# Patient Record
Sex: Male | Born: 1935
Health system: Southern US, Community
[De-identification: ages and names within clinical notes are randomized; demographics above are authoritative.]

## PROBLEM LIST (undated history)

## (undated) DIAGNOSIS — I5020 Unspecified systolic (congestive) heart failure: Secondary | ICD-10-CM

## (undated) DIAGNOSIS — I2510 Atherosclerotic heart disease of native coronary artery without angina pectoris: Secondary | ICD-10-CM

## (undated) DIAGNOSIS — I255 Ischemic cardiomyopathy: Secondary | ICD-10-CM

## (undated) DIAGNOSIS — J449 Chronic obstructive pulmonary disease, unspecified: Secondary | ICD-10-CM

## (undated) DIAGNOSIS — I10 Essential (primary) hypertension: Secondary | ICD-10-CM

## (undated) DIAGNOSIS — E785 Hyperlipidemia, unspecified: Secondary | ICD-10-CM

## (undated) HISTORY — DX: Unspecified systolic (congestive) heart failure: I50.20

## (undated) HISTORY — DX: Atherosclerotic heart disease of native coronary artery without angina pectoris: I25.10

## (undated) HISTORY — DX: Essential (primary) hypertension: I10

## (undated) HISTORY — DX: Hyperlipidemia, unspecified: E78.5

## (undated) HISTORY — DX: Ischemic cardiomyopathy: I25.5

## (undated) HISTORY — DX: Chronic obstructive pulmonary disease, unspecified: J44.9

## (undated) HISTORY — PX: BI-VENTRICULAR IMPLANTABLE CARDIOVERTER DEFIBRILLATOR  (CRT-D): SHX5747

---

## 1987-03-26 HISTORY — PX: CORONARY ARTERY BYPASS GRAFT: SHX141

## 1999-09-12 ENCOUNTER — Inpatient Hospital Stay (HOSPITAL_COMMUNITY): Admission: EM | Admit: 1999-09-12 | Discharge: 1999-09-14 | Payer: Self-pay | Admitting: Emergency Medicine

## 1999-09-14 ENCOUNTER — Encounter: Payer: Self-pay | Admitting: Cardiology

## 1999-11-15 ENCOUNTER — Encounter: Payer: Self-pay | Admitting: Internal Medicine

## 1999-11-15 ENCOUNTER — Observation Stay (HOSPITAL_COMMUNITY): Admission: RE | Admit: 1999-11-15 | Discharge: 1999-11-16 | Payer: Self-pay | Admitting: Internal Medicine

## 1999-11-16 ENCOUNTER — Encounter: Payer: Self-pay | Admitting: Internal Medicine

## 2001-05-07 ENCOUNTER — Ambulatory Visit (HOSPITAL_COMMUNITY): Admission: RE | Admit: 2001-05-07 | Discharge: 2001-05-07 | Payer: Self-pay | Admitting: Specialist

## 2001-05-20 ENCOUNTER — Ambulatory Visit (HOSPITAL_COMMUNITY): Admission: RE | Admit: 2001-05-20 | Discharge: 2001-05-20 | Payer: Self-pay | Admitting: Specialist

## 2001-05-20 ENCOUNTER — Encounter: Payer: Self-pay | Admitting: Specialist

## 2001-07-13 ENCOUNTER — Ambulatory Visit (HOSPITAL_COMMUNITY): Admission: RE | Admit: 2001-07-13 | Discharge: 2001-07-13 | Payer: Self-pay | Admitting: Specialist

## 2003-12-15 ENCOUNTER — Ambulatory Visit (HOSPITAL_COMMUNITY): Admission: RE | Admit: 2003-12-15 | Discharge: 2003-12-15 | Payer: Self-pay | Admitting: Internal Medicine

## 2004-04-04 ENCOUNTER — Ambulatory Visit: Payer: Self-pay | Admitting: Internal Medicine

## 2004-08-01 ENCOUNTER — Ambulatory Visit: Payer: Self-pay | Admitting: Cardiology

## 2004-08-02 ENCOUNTER — Ambulatory Visit: Payer: Self-pay | Admitting: Cardiology

## 2004-08-06 ENCOUNTER — Ambulatory Visit: Payer: Self-pay | Admitting: Internal Medicine

## 2004-10-16 ENCOUNTER — Ambulatory Visit: Payer: Self-pay | Admitting: Cardiology

## 2004-12-10 ENCOUNTER — Ambulatory Visit: Payer: Self-pay | Admitting: Cardiology

## 2005-01-14 ENCOUNTER — Ambulatory Visit: Payer: Self-pay | Admitting: Internal Medicine

## 2005-04-17 ENCOUNTER — Ambulatory Visit: Payer: Self-pay | Admitting: Cardiology

## 2005-05-07 ENCOUNTER — Ambulatory Visit: Payer: Self-pay | Admitting: Internal Medicine

## 2005-12-23 ENCOUNTER — Ambulatory Visit: Payer: Self-pay | Admitting: Cardiology

## 2005-12-31 ENCOUNTER — Ambulatory Visit: Payer: Self-pay

## 2006-01-01 LAB — CONVERTED CEMR LAB
ALT: 19 units/L (ref 0–40)
AST: 21 units/L (ref 0–37)
Albumin: 3.7 g/dL (ref 3.5–5.2)
Alkaline Phosphatase: 43 units/L (ref 39–117)
BUN: 11 mg/dL (ref 6–23)
Bilirubin, Direct: 0.2 mg/dL (ref 0.0–0.3)
CO2: 29 meq/L (ref 19–32)
Calcium: 9.5 mg/dL (ref 8.4–10.5)
Chloride: 107 meq/L (ref 96–112)
Chol/HDL Ratio, serum: 3.6
Cholesterol: 132 mg/dL (ref 0–200)
Creatinine, Ser: 1.3 mg/dL (ref 0.4–1.5)
GFR calc non Af Amer: 58 mL/min
Glomerular Filtration Rate, Af Am: 70 mL/min/{1.73_m2}
Glucose, Bld: 98 mg/dL (ref 70–99)
HCT: 44.2 % (ref 39.0–52.0)
HDL: 36.9 mg/dL — ABNORMAL LOW (ref >39.0)
Hemoglobin: 14.9 g/dL (ref 13.0–17.0)
LDL Cholesterol: 81 mg/dL (ref 0–99)
MCHC: 33.8 g/dL (ref 30.0–36.0)
MCV: 90.7 fL (ref 78.0–100.0)
Platelets: 156 10*3/uL (ref 150–400)
Potassium: 4.2 meq/L (ref 3.5–5.1)
RBC: 4.87 M/uL (ref 4.22–5.81)
RDW: 13.1 % (ref 11.5–14.6)
Sodium: 141 meq/L (ref 135–145)
Total Bilirubin: 0.8 mg/dL (ref 0.3–1.2)
Total Protein: 6.5 g/dL (ref 6.0–8.3)
Triglyceride fasting, serum: 71 mg/dL (ref 0–149)
VLDL: 14 mg/dL (ref 0–40)
WBC: 8.3 10*3/uL (ref 4.5–10.5)

## 2006-08-27 ENCOUNTER — Ambulatory Visit: Payer: Self-pay | Admitting: Internal Medicine

## 2006-11-27 ENCOUNTER — Ambulatory Visit: Payer: Self-pay | Admitting: Internal Medicine

## 2006-12-26 ENCOUNTER — Ambulatory Visit: Payer: Self-pay | Admitting: Cardiology

## 2006-12-30 ENCOUNTER — Ambulatory Visit: Payer: Self-pay | Admitting: Cardiology

## 2006-12-30 LAB — CONVERTED CEMR LAB
AST: 17 units/L (ref 0–37)
Bilirubin, Direct: 0.3 mg/dL (ref 0.0–0.3)
CO2: 33 meq/L — ABNORMAL HIGH (ref 19–32)
Chloride: 105 meq/L (ref 96–112)
Cholesterol: 121 mg/dL (ref 0–200)
Creatinine, Ser: 1.2 mg/dL (ref 0.4–1.5)
Eosinophils Relative: 6.3 % — ABNORMAL HIGH (ref 0.0–5.0)
Glucose, Bld: 108 mg/dL — ABNORMAL HIGH (ref 70–99)
HCT: 42.8 % (ref 39.0–52.0)
Hemoglobin: 14.6 g/dL (ref 13.0–17.0)
LDL Cholesterol: 70 mg/dL (ref 0–99)
MCHC: 34.2 g/dL (ref 30.0–36.0)
MCV: 90.7 fL (ref 78.0–100.0)
Monocytes Absolute: 0.8 10*3/uL — ABNORMAL HIGH (ref 0.2–0.7)
Neutrophils Relative %: 67 % (ref 43.0–77.0)
Potassium: 4.3 meq/L (ref 3.5–5.1)
RBC: 4.72 M/uL (ref 4.22–5.81)
RDW: 12.4 % (ref 11.5–14.6)
Sodium: 141 meq/L (ref 135–145)
TSH: 1.51 microintl units/mL (ref 0.35–5.50)
Total Bilirubin: 1 mg/dL (ref 0.3–1.2)
Total CHOL/HDL Ratio: 3.3
Total Protein: 6.5 g/dL (ref 6.0–8.3)
WBC: 9.9 10*3/uL (ref 4.5–10.5)

## 2007-02-26 ENCOUNTER — Ambulatory Visit: Payer: Self-pay | Admitting: Internal Medicine

## 2007-05-28 ENCOUNTER — Ambulatory Visit: Payer: Self-pay | Admitting: Internal Medicine

## 2007-09-01 ENCOUNTER — Ambulatory Visit: Payer: Self-pay | Admitting: Internal Medicine

## 2007-11-23 ENCOUNTER — Ambulatory Visit: Payer: Self-pay | Admitting: Cardiology

## 2007-11-25 ENCOUNTER — Ambulatory Visit: Payer: Self-pay | Admitting: Cardiology

## 2007-11-25 LAB — CONVERTED CEMR LAB
ALT: 18 units/L (ref 0–53)
AST: 20 units/L (ref 0–37)
Albumin: 4 g/dL (ref 3.5–5.2)
BUN: 11 mg/dL (ref 6–23)
Basophils Relative: 0.8 % (ref 0.0–3.0)
Calcium: 9.5 mg/dL (ref 8.4–10.5)
Chloride: 110 meq/L (ref 96–112)
Cholesterol: 142 mg/dL (ref 0–200)
Creatinine, Ser: 1.2 mg/dL (ref 0.4–1.5)
Eosinophils Absolute: 0.2 10*3/uL (ref 0.0–0.7)
Eosinophils Relative: 3 % (ref 0.0–5.0)
GFR calc non Af Amer: 63 mL/min
HCT: 45.5 % (ref 39.0–52.0)
Hemoglobin: 15.7 g/dL (ref 13.0–17.0)
MCV: 90.9 fL (ref 78.0–100.0)
Neutro Abs: 4.9 10*3/uL (ref 1.4–7.7)
Neutrophils Relative %: 60.9 % (ref 43.0–77.0)
Pro B Natriuretic peptide (BNP): 195 pg/mL — ABNORMAL HIGH (ref 0.0–100.0)
RBC: 5.01 M/uL (ref 4.22–5.81)
VLDL: 21 mg/dL (ref 0–40)
WBC: 8 10*3/uL (ref 4.5–10.5)

## 2007-12-03 ENCOUNTER — Ambulatory Visit: Payer: Self-pay | Admitting: Internal Medicine

## 2008-01-06 ENCOUNTER — Ambulatory Visit: Payer: Self-pay | Admitting: Cardiology

## 2008-02-05 ENCOUNTER — Ambulatory Visit: Payer: Self-pay | Admitting: Internal Medicine

## 2008-02-10 ENCOUNTER — Ambulatory Visit: Payer: Self-pay

## 2008-02-10 ENCOUNTER — Ambulatory Visit: Payer: Self-pay | Admitting: Internal Medicine

## 2008-02-10 LAB — CONVERTED CEMR LAB
Eosinophils Absolute: 0.2 10*3/uL (ref 0.0–0.7)
GFR calc Af Amer: 84 mL/min
GFR calc non Af Amer: 69 mL/min
HCT: 45.2 % (ref 39.0–52.0)
INR: 0.9 (ref 0.8–1.0)
MCV: 88.5 fL (ref 78.0–100.0)
Monocytes Absolute: 0.6 10*3/uL (ref 0.1–1.0)
Platelets: 140 10*3/uL — ABNORMAL LOW (ref 150–400)
Potassium: 4.5 meq/L (ref 3.5–5.1)
RDW: 13 % (ref 11.5–14.6)
Sodium: 141 meq/L (ref 135–145)
aPTT: 27.8 s (ref 21.7–29.8)

## 2008-02-11 ENCOUNTER — Observation Stay (HOSPITAL_COMMUNITY): Admission: RE | Admit: 2008-02-11 | Discharge: 2008-02-12 | Payer: Self-pay | Admitting: Internal Medicine

## 2008-02-11 ENCOUNTER — Ambulatory Visit: Payer: Self-pay | Admitting: Internal Medicine

## 2008-03-02 ENCOUNTER — Ambulatory Visit: Payer: Self-pay | Admitting: Internal Medicine

## 2008-03-02 ENCOUNTER — Ambulatory Visit: Payer: Self-pay

## 2008-03-02 LAB — CONVERTED CEMR LAB
BUN: 8 mg/dL (ref 6–23)
CO2: 31 meq/L (ref 19–32)
Calcium: 9.5 mg/dL (ref 8.4–10.5)
Chloride: 104 meq/L (ref 96–112)
Creatinine, Ser: 1.1 mg/dL (ref 0.4–1.5)
GFR calc Af Amer: 84 mL/min
GFR calc non Af Amer: 69 mL/min
Glucose, Bld: 108 mg/dL — ABNORMAL HIGH (ref 70–99)
Potassium: 4.4 meq/L (ref 3.5–5.1)
Sodium: 140 meq/L (ref 135–145)

## 2008-03-07 ENCOUNTER — Ambulatory Visit: Payer: Self-pay | Admitting: Internal Medicine

## 2008-04-11 ENCOUNTER — Ambulatory Visit: Payer: Self-pay | Admitting: Cardiology

## 2008-04-13 ENCOUNTER — Ambulatory Visit: Payer: Self-pay | Admitting: Cardiology

## 2008-04-13 LAB — CONVERTED CEMR LAB
Albumin: 3.9 g/dL (ref 3.5–5.2)
Alkaline Phosphatase: 47 units/L (ref 39–117)
BUN: 11 mg/dL (ref 6–23)
Calcium: 9.5 mg/dL (ref 8.4–10.5)
Creatinine, Ser: 1.2 mg/dL (ref 0.4–1.5)
Eosinophils Absolute: 0.3 10*3/uL (ref 0.0–0.7)
Eosinophils Relative: 3.8 % (ref 0.0–5.0)
GFR calc Af Amer: 76 mL/min
GFR calc non Af Amer: 63 mL/min
HCT: 47.5 % (ref 39.0–52.0)
HDL: 42.5 mg/dL (ref >39.0)
MCV: 88 fL (ref 78.0–100.0)
Monocytes Absolute: 0.6 10*3/uL (ref 0.1–1.0)
Platelets: 129 10*3/uL — ABNORMAL LOW (ref 150–400)
Potassium: 4.6 meq/L (ref 3.5–5.1)
Pro B Natriuretic peptide (BNP): 222 pg/mL — ABNORMAL HIGH (ref 0.0–100.0)
TSH: 1.6 microintl units/mL (ref 0.35–5.50)
Triglycerides: 118 mg/dL (ref 0–149)
WBC: 8.5 10*3/uL (ref 4.5–10.5)

## 2008-05-20 ENCOUNTER — Encounter: Payer: Self-pay | Admitting: Internal Medicine

## 2008-05-24 ENCOUNTER — Ambulatory Visit: Payer: Self-pay | Admitting: Internal Medicine

## 2008-07-04 DIAGNOSIS — I10 Essential (primary) hypertension: Secondary | ICD-10-CM | POA: Insufficient documentation

## 2008-07-04 DIAGNOSIS — I5022 Chronic systolic (congestive) heart failure: Secondary | ICD-10-CM | POA: Insufficient documentation

## 2008-07-04 DIAGNOSIS — E785 Hyperlipidemia, unspecified: Secondary | ICD-10-CM | POA: Insufficient documentation

## 2008-07-04 DIAGNOSIS — I255 Ischemic cardiomyopathy: Secondary | ICD-10-CM | POA: Insufficient documentation

## 2008-07-04 DIAGNOSIS — I2589 Other forms of chronic ischemic heart disease: Secondary | ICD-10-CM

## 2008-07-04 DIAGNOSIS — J449 Chronic obstructive pulmonary disease, unspecified: Secondary | ICD-10-CM | POA: Insufficient documentation

## 2008-07-24 ENCOUNTER — Encounter: Payer: Self-pay | Admitting: Internal Medicine

## 2008-07-25 ENCOUNTER — Encounter: Payer: Self-pay | Admitting: Cardiology

## 2008-07-25 ENCOUNTER — Ambulatory Visit: Payer: Self-pay

## 2008-07-25 ENCOUNTER — Ambulatory Visit: Payer: Self-pay | Admitting: Internal Medicine

## 2008-08-02 ENCOUNTER — Encounter: Payer: Self-pay | Admitting: Internal Medicine

## 2008-08-09 ENCOUNTER — Ambulatory Visit: Payer: Self-pay | Admitting: Cardiology

## 2008-08-17 ENCOUNTER — Ambulatory Visit: Payer: Self-pay | Admitting: Cardiology

## 2008-08-18 LAB — CONVERTED CEMR LAB
Eosinophils Relative: 3.6 % (ref 0.0–5.0)
GFR calc non Af Amer: 62.51 mL/min (ref >60)
HCT: 46.6 % (ref 39.0–52.0)
Hemoglobin: 15.9 g/dL (ref 13.0–17.0)
Lymphocytes Relative: 24.4 % (ref 12.0–46.0)
Lymphs Abs: 1.9 10*3/uL (ref 0.7–4.0)
Monocytes Relative: 6.6 % (ref 3.0–12.0)
Neutro Abs: 5.1 10*3/uL (ref 1.4–7.7)
Potassium: 4.7 meq/L (ref 3.5–5.1)
Pro B Natriuretic peptide (BNP): 174 pg/mL — ABNORMAL HIGH (ref 0.0–100.0)
RBC: 5.28 M/uL (ref 4.22–5.81)
Sodium: 142 meq/L (ref 135–145)
WBC: 7.8 10*3/uL (ref 4.5–10.5)

## 2008-10-24 ENCOUNTER — Ambulatory Visit: Payer: Self-pay | Admitting: Internal Medicine

## 2008-10-24 ENCOUNTER — Telehealth: Payer: Self-pay | Admitting: Internal Medicine

## 2008-11-29 ENCOUNTER — Encounter: Payer: Self-pay | Admitting: Internal Medicine

## 2008-11-29 ENCOUNTER — Ambulatory Visit: Payer: Self-pay | Admitting: Cardiology

## 2009-03-28 ENCOUNTER — Ambulatory Visit: Payer: Self-pay | Admitting: Cardiology

## 2009-04-07 ENCOUNTER — Ambulatory Visit: Payer: Self-pay | Admitting: Internal Medicine

## 2009-04-26 ENCOUNTER — Ambulatory Visit (HOSPITAL_COMMUNITY): Admission: RE | Admit: 2009-04-26 | Discharge: 2009-04-26 | Payer: Self-pay | Admitting: Internal Medicine

## 2009-04-26 ENCOUNTER — Ambulatory Visit: Payer: Self-pay | Admitting: Internal Medicine

## 2009-05-09 ENCOUNTER — Encounter: Payer: Self-pay | Admitting: Internal Medicine

## 2009-05-09 ENCOUNTER — Ambulatory Visit: Payer: Self-pay

## 2009-05-09 ENCOUNTER — Ambulatory Visit (HOSPITAL_COMMUNITY): Admission: RE | Admit: 2009-05-09 | Discharge: 2009-05-09 | Payer: Self-pay | Admitting: Internal Medicine

## 2009-05-09 ENCOUNTER — Ambulatory Visit: Payer: Self-pay | Admitting: Cardiology

## 2009-06-19 ENCOUNTER — Encounter: Payer: Self-pay | Admitting: Internal Medicine

## 2009-06-20 ENCOUNTER — Ambulatory Visit: Payer: Self-pay | Admitting: Internal Medicine

## 2009-07-27 ENCOUNTER — Telehealth: Payer: Self-pay | Admitting: Cardiology

## 2009-09-17 ENCOUNTER — Encounter: Payer: Self-pay | Admitting: Internal Medicine

## 2009-09-18 ENCOUNTER — Ambulatory Visit: Payer: Self-pay | Admitting: Internal Medicine

## 2009-09-22 ENCOUNTER — Ambulatory Visit: Payer: Self-pay | Admitting: Cardiology

## 2009-09-22 LAB — CONVERTED CEMR LAB
BUN: 11 mg/dL (ref 6–23)
Basophils Relative: 0 % (ref 0–1)
CO2: 29 meq/L (ref 19–32)
Calcium: 9.4 mg/dL (ref 8.4–10.5)
Chloride: 100 meq/L (ref 96–112)
Creatinine, Ser: 1.32 mg/dL (ref 0.40–1.50)
Eosinophils Absolute: 0.3 10*3/uL (ref 0.0–0.7)
Eosinophils Relative: 3 % (ref 0–5)
Glucose, Bld: 118 mg/dL — ABNORMAL HIGH (ref 70–99)
HCT: 43.2 % (ref 39.0–52.0)
Lymphs Abs: 2.4 10*3/uL (ref 0.7–4.0)
MCHC: 34.5 g/dL (ref 30.0–36.0)
MCV: 92.7 fL (ref 78.0–100.0)
Monocytes Relative: 7 % (ref 3–12)
Neutrophils Relative %: 67 % (ref 43–77)
RBC: 4.67 M/uL (ref 4.22–5.81)

## 2009-09-26 ENCOUNTER — Encounter (INDEPENDENT_AMBULATORY_CARE_PROVIDER_SITE_OTHER): Payer: Self-pay | Admitting: *Deleted

## 2009-09-26 ENCOUNTER — Encounter: Payer: Self-pay | Admitting: Cardiology

## 2009-10-02 ENCOUNTER — Encounter: Payer: Self-pay | Admitting: Cardiology

## 2009-10-03 ENCOUNTER — Ambulatory Visit: Payer: Self-pay | Admitting: Cardiology

## 2009-10-03 DIAGNOSIS — R0602 Shortness of breath: Secondary | ICD-10-CM | POA: Insufficient documentation

## 2009-10-03 LAB — CONVERTED CEMR LAB
BUN: 17 mg/dL (ref 6–23)
Basophils Absolute: 0 10*3/uL (ref 0.0–0.1)
Calcium: 9.6 mg/dL (ref 8.4–10.5)
Creatinine, Ser: 1.4 mg/dL (ref 0.4–1.5)
Eosinophils Relative: 2.6 % (ref 0.0–5.0)
GFR calc non Af Amer: 53.95 mL/min (ref >60)
Glucose, Bld: 99 mg/dL (ref 70–99)
HCT: 43.8 % (ref 39.0–52.0)
Lymphocytes Relative: 27.7 % (ref 12.0–46.0)
Lymphs Abs: 2.7 10*3/uL (ref 0.7–4.0)
Monocytes Relative: 6.1 % (ref 3.0–12.0)
Neutrophils Relative %: 63.3 % (ref 43.0–77.0)
Platelets: 161 10*3/uL (ref 150.0–400.0)
Potassium: 4.8 meq/L (ref 3.5–5.1)
Prothrombin Time: 10.3 s (ref 9.7–11.8)
RDW: 13.1 % (ref 11.5–14.6)
WBC: 9.7 10*3/uL (ref 4.5–10.5)
aPTT: 25.7 s (ref 21.7–28.8)

## 2009-10-06 ENCOUNTER — Encounter: Payer: Self-pay | Admitting: Internal Medicine

## 2009-10-06 ENCOUNTER — Ambulatory Visit: Payer: Self-pay | Admitting: Cardiology

## 2009-10-06 ENCOUNTER — Inpatient Hospital Stay (HOSPITAL_BASED_OUTPATIENT_CLINIC_OR_DEPARTMENT_OTHER): Admission: RE | Admit: 2009-10-06 | Discharge: 2009-10-06 | Payer: Self-pay | Admitting: Cardiology

## 2009-10-11 ENCOUNTER — Ambulatory Visit: Payer: Self-pay | Admitting: Cardiology

## 2009-10-17 ENCOUNTER — Telehealth: Payer: Self-pay | Admitting: Cardiology

## 2009-10-17 LAB — CONVERTED CEMR LAB
BUN: 18 mg/dL (ref 6–23)
CO2: 29 meq/L (ref 19–32)
Calcium: 9.4 mg/dL (ref 8.4–10.5)
Chloride: 103 meq/L (ref 96–112)
Creatinine, Ser: 1.4 mg/dL (ref 0.4–1.5)
GFR calc non Af Amer: 53.94 mL/min (ref >60)
Glucose, Bld: 98 mg/dL (ref 70–99)
Potassium: 4.8 meq/L (ref 3.5–5.1)
Sodium: 138 meq/L (ref 135–145)

## 2009-11-08 ENCOUNTER — Encounter: Payer: Self-pay | Admitting: Cardiology

## 2009-11-09 ENCOUNTER — Ambulatory Visit: Payer: Self-pay | Admitting: Cardiology

## 2010-01-09 ENCOUNTER — Ambulatory Visit: Payer: Self-pay | Admitting: Cardiology

## 2010-01-09 ENCOUNTER — Encounter: Payer: Self-pay | Admitting: Cardiology

## 2010-01-09 ENCOUNTER — Encounter: Payer: Self-pay | Admitting: Internal Medicine

## 2010-01-12 ENCOUNTER — Telehealth: Payer: Self-pay | Admitting: Cardiology

## 2010-01-15 LAB — CONVERTED CEMR LAB
Calcium: 9.8 mg/dL (ref 8.4–10.5)
Chloride: 98 meq/L (ref 96–112)
Creatinine, Ser: 1.5 mg/dL (ref 0.4–1.5)
Sodium: 135 meq/L (ref 135–145)

## 2010-03-06 ENCOUNTER — Telehealth: Payer: Self-pay | Admitting: Cardiology

## 2010-04-12 ENCOUNTER — Encounter: Payer: Self-pay | Admitting: Internal Medicine

## 2010-04-12 ENCOUNTER — Ambulatory Visit
Admission: RE | Admit: 2010-04-12 | Discharge: 2010-04-12 | Payer: Self-pay | Source: Home / Self Care | Attending: Cardiovascular Disease | Admitting: Cardiovascular Disease

## 2010-04-12 ENCOUNTER — Ambulatory Visit
Admission: RE | Admit: 2010-04-12 | Discharge: 2010-04-12 | Payer: Self-pay | Source: Home / Self Care | Attending: Internal Medicine | Admitting: Internal Medicine

## 2010-04-22 LAB — CONVERTED CEMR LAB
BUN: 13 mg/dL (ref 6–23)
CO2: 32 meq/L (ref 19–32)
Chloride: 104 meq/L (ref 96–112)
Creatinine, Ser: 1.3 mg/dL (ref 0.4–1.5)
Potassium: 4.6 meq/L (ref 3.5–5.1)

## 2010-04-24 NOTE — Progress Notes (Signed)
Summary: refill medsdone x2  Phone Note Refill Request Call back at Home Phone [phone] Message from:  Patient on Jul 27, 2009 2:07 PM  Refills Requested: Medication #1:  ISOSORBIDE DINITRATE CR 40 MG CR-TABS Take two capsules by mouth at bedtime [facility] [address]    Method Requested: Fax to Local Pharmacy Initial call taken by: Lorne Skeens,  Jul 27, 2009 2:08 PM    Prescriptions: ISOSORBIDE DINITRATE CR 40 MG CR-TABS (ISOSORBIDE DINITRATE) Take two capsules by mouth at bedtime  #60 x 6   Entered by:   Burnett Kanaris, CNA   Authorized by:   Lenoria Farrier, MD, [facility]   Signed by:   Burnett Kanaris, CNA on 07/27/2009   Method used:   Electronically to        [facility]  [phone]* (retail)       [address]       [place], [place]  [address]       Ph: [phone] or [phone]       Fax: [phone]   RxID:   [id number]   Appended Document: Isosorbide corrected to Monotrate this is the wrong prescription, per pt he has been on Isosorbide Monotrate 30mg  two times a day for a while, this is also what the pharmacy shows, new rx w/12 refills given to pharmacy via phone   Clinical Lists Changes  Medications: Changed medication from ISOSORBIDE DINITRATE CR 40 MG CR-TABS (ISOSORBIDE DINITRATE) Take two capsules by mouth at bedtime to ISOSORBIDE MONONITRATE CR 30 MG XR24H-TAB (ISOSORBIDE MONONITRATE) Take one tablet by mouth two times a day - Signed Rx of ISOSORBIDE MONONITRATE CR 30 MG XR24H-TAB (ISOSORBIDE MONONITRATE) Take one tablet by mouth two times a day;  #60 x 12;  Signed;  Entered by: Meredith Staggers, RN;  Authorized by: Lenoria Farrier, MD, [facility];  Method used: Telephoned to [facility]  [phone]*, [address], [place], [place], [place]  [address], Ph: [phone] or [phone], Fax: [phone]    Prescriptions: ISOSORBIDE MONONITRATE CR 30 MG XR24H-TAB (ISOSORBIDE MONONITRATE) Take one  tablet by mouth two times a day  #60 x 12   Entered by:   Meredith Staggers, RN   Authorized by:   Lenoria Farrier, MD, [facility]   Signed by:   Meredith Staggers, RN on 07/27/2009   Method used:   Telephoned to ...       [facility]  [address]  [phone]* (retail)       [address]       [place], [place]  [address]       Ph: [phone] or [phone]       Fax: [phone]   RxID:   [id number]

## 2010-04-24 NOTE — Cardiovascular Report (Signed)
Summary: Office Visit Remote   Office Visit Remote   Imported By: Roderic Ovens 10/09/2009 12:31:50  _____________________________________________________________________  External Attachment:    Type:   Image     Comment:   External Document

## 2010-04-24 NOTE — Letter (Signed)
Summary: ELectrophysiology/Ablation Procedure Instructions  [facility], Main Office  [address]   [place], [place] [address]   Phone: [phone]  Fax: [phone]     Electrophysiology/Ablation Procedure Instructions    You are scheduled for a(n) DFT CHECK on Apr 26, 2009 at 1:00 PM with Dr. Graciela Husbands.  1.  Please come to the Short Stay Center at [facility] at 11:00 on the day of your procedure.  2.  Come prepared to stay overnight.   Please bring your insurance cards and a list of your medications.  3.  Lab work will be done in the hopital that morning: BMET  4.  Do not have anything to eat or drink after midnight the night before your procedure.  5.  All of your remaining medications may be taken with a small amount of water.      * If you have any questions after you get home, please call the office at [phone].

## 2010-04-24 NOTE — Miscellaneous (Signed)
  Clinical Lists Changes  Observations: Added new observation of ECHOINTERP:  - Left ventricle: There is akinesis of the inferior and posterior       walls. There is aneurysmal dilitation of the base of the inferior       wall. The EF is in the 30% range.     - Aortic valve: Sclerosis without stenosis.     - Mitral valve: Mild regurgitation.     - Right ventricle: Pacer wire or catheter noted in right ventricle. (05/09/2009 14:09)      Echocardiogram  Procedure date:  05/09/2009  Findings:       - Left ventricle: There is akinesis of the inferior and posterior       walls. There is aneurysmal dilitation of the base of the inferior       wall. The EF is in the 30% range.     - Aortic valve: Sclerosis without stenosis.     - Mitral valve: Mild regurgitation.     - Right ventricle: Pacer wire or catheter noted in right ventricle.

## 2010-04-24 NOTE — Assessment & Plan Note (Signed)
Summary: f22m   Visit Type:  Follow-up Primary Provider:  Dr Annitta Needs at [facility]  CC:  no complaints.  History of Present Illness: [name] [name] is 77 years old return for management of CHF and CAD.he had bypass surgery in 1989 and has known total occlusion of the vein graft to the diagonal branch of the LAD. He has an ischemic cardiomyopathy with ejection fraction of 30%. This improved to 35-40% following placement of a biventricular pacemaker but then fell to 30% on his last echocardiogram in February of 2011.  He says he's not been doing well he gives out very easily. He dates this to November 2009 when he had an upgrade of his ICD to a biventricular pacemaker. His initial ICD was implanted as part of the beta-2 trial in 2001. In 2005 units in exchange for recall. In November 2009 he had an upgrade to a biventricular pacemaker and had a new ventricular sensing lead placed in the right ventricle. There was some concern about sensing of the right ventricle he underwent EP testing in February of this year and everything was okay with the sensing.  He's not had chest pain.  His other problems include hypertension, hyperlipidemia, and COPD.  Current Medications (verified): 1)  Aspirin 81 Mg Tbec (Aspirin) .... Take One Tablet By Mouth Daily 2)  Isosorbide Mononitrate Cr 30 Mg Xr24h-Tab (Isosorbide Mononitrate) .... Take One Tablet By Mouth Two Times A Day 3)  Simvastatin 40 Mg Tabs (Simvastatin) .... Take One Tablet By Mouth Daily At Bedtime 4)  Ramipril 10 Mg Caps (Ramipril) .... Take One Capsule By Mouth 2 Times Per Day 5)  Aldactone 25 Mg Tabs (Spironolactone) .... Take 1/2 Tablet Daily 6)  Bisoprolol-Hydrochlorothiazide 5-6.25 Mg Tabs (Bisoprolol-Hydrochlorothiazide) .... Once Daily  Allergies (verified): No Known Drug Allergies  Past History:  Past Medical History: Reviewed history from 04/06/2009 and no changes required.  COPD (ICD-496) 1. Coronary artery disease status post coronary  bypass graft surgery     in 1989 with documented occlusion of the vein graft to the diagonal     branch of the left anterior descending in 2001. 2. Ischemic cardiomyopathy, ejection fraction of 30% improved 35-40% 5/09 3. Class III systolic congestive heart, improved to class II,     euvolemic. 4. Status post dual-chamber implantable cardioverter-defibrillator     with recent upgrade to a biventricular pacer/implantable     cardioverter-defibrillator.-St. Jude Promote [id number] 5. Hyperlipidemia. 6. Hypertension. 7. Chronic obstructive pulmonary disease.     Review of Systems       ROS is negative except as outlined in HPI.   Vital Signs:  Patient profile:   77 year old male Height:      67 inches Weight:      155 pounds BMI:     24.36 Pulse rate:   63 / minute BP sitting:   124 / 68  (left arm) Cuff size:   large  Vitals Entered By: Burnett Kanaris, CNA (September 22, 2009 2:03 PM)  Physical Exam  Additional Exam:  Gen. Well-nourished, in no distress   Neck: No JVD, thyroid not enlarged, bilateral carotid bruits Lungs: No tachypnea, clear without rales, rhonchi or wheezes Cardiovascular: Rhythm regular, PMI not displaced,  heart sounds  normal, no murmurs or gallops, no peripheral edema, pulses normal in all 4 extremities. Abdomen: BS normal, abdomen soft and non-tender without masses or organomegaly, no hepatosplenomegaly. MS: No deformities, no cyanosis or clubbing   Neuro:  No focal sns   Skin:  no lesions     ICD Specifications Following MD:  Sherryl Manges, MD     Referring MD:  Evana Runnels ICD Vendor:  St Jude     ICD Model Number:  BM8413-24     ICD Serial Number:  [id number] ICD DOI:  02/11/2008     ICD Implanting MD:  Sherryl Manges, MD  Lead 1:    Location: RA     DOI: 11/15/1999     Model #: 6940     Serial #: [id number]     Status: active Lead 2:    Location: RV     DOI: 11/15/1999     Model #: 0148     Serial #: [id number]     Status: active Lead 3:    Location: RV     DOI:  02/11/2008     Model #: 1688TC     Serial #: [id number]     Status: active Lead 4:    Location: LV     DOI: 02/11/2008     Model #: 1156T     Serial #: [id number]     Status: active  Indications::  VT; CHF  Explantation Comments: RV rate sense lead  ICD Follow Up ICD Dependent:  No       ICD Device Measurements Configuration: LV ring to RV coil  Episodes Coumadin:  No  Brady Parameters Mode DDD     Lower Rate Limit:  60     Upper Rate Limit 110 PAV 150     Sensed AV Delay:  130  Tachy Zones VF:  214     VT:  187     VT1:  160     Impression & Recommendations:  Problem # 1:  SYSTOLIC HEART FAILURE, CHRONIC (ICD-428.22)  He has ischemic cardiomyopathy with an ejection fraction of 30%. He has persistent symptoms of fatigue with exertion and is somewhat unhappy about his symptoms. He dates them to when he had the upgrade in his biventricular pacemaker although his ejection fraction initially improved and according to clinical notes he seemed to have some clinical improvement. His last ejection fraction showed some decline down to 30%.  He is now 20 years post bypass; the best way to try and find out if there is something we can do to help with the symptoms to be a cardiac catheterization. He will consider this after discussing it with his children. His wife is with him today. Dr. Graciela Husbands had recently evaluated his pacemaker/defibrillator and had optimize the AV delay and all the function appears good. His updated medication list for this problem includes:    Aspirin 81 Mg Tbec (Aspirin) .Marland Kitchen... Take one tablet by mouth daily    Isosorbide Mononitrate Cr 30 Mg Xr24h-tab (Isosorbide mononitrate) .Marland Kitchen... Take one tablet by mouth two times a day    Ramipril 10 Mg Caps (Ramipril) .Marland Kitchen... Take one capsule by mouth 2 times per day    Aldactone 25 Mg Tabs (Spironolactone) .Marland Kitchen... Take 1/2 tablet daily    Bisoprolol-hydrochlorothiazide 5-6.25 Mg Tabs (Bisoprolol-hydrochlorothiazide) ..... Once  daily  Orders: T-Basic Metabolic Panel [phone]) T-CBC w/Diff [phone]) T-BNP  (B Natriuretic Peptide) [phone])  His updated medication list for this problem includes:    Aspirin 81 Mg Tbec (Aspirin) .Marland Kitchen... Take one tablet by mouth daily    Isosorbide Mononitrate Cr 30 Mg Xr24h-tab (Isosorbide mononitrate) .Marland Kitchen... Take one tablet by mouth two times a day    Ramipril 10 Mg Caps (Ramipril) .Marland Kitchen... Take one capsule by mouth  2 times per day    Aldactone 25 Mg Tabs (Spironolactone) .Marland Kitchen... Take 1/2 tablet daily    Bisoprolol-hydrochlorothiazide 5-6.25 Mg Tabs (Bisoprolol-hydrochlorothiazide) ..... Once daily  Problem # 2:  CAD, ARTERY BYPASS GRAFT (ICD-414.04)  He had remote bypass surgery in 1989 and has one occluded graft to the diagonal branch. He's had no chest pain but his symptoms of fatigue could be an anginal equivalent and his LV function is gotten worse. We will consider further evaluation as outlined above. His updated medication list for this problem includes:    Aspirin 81 Mg Tbec (Aspirin) .Marland Kitchen... Take one tablet by mouth daily    Isosorbide Mononitrate Cr 30 Mg Xr24h-tab (Isosorbide mononitrate) .Marland Kitchen... Take one tablet by mouth two times a day    Ramipril 10 Mg Caps (Ramipril) .Marland Kitchen... Take one capsule by mouth 2 times per day    Bisoprolol-hydrochlorothiazide 5-6.25 Mg Tabs (Bisoprolol-hydrochlorothiazide) ..... Once daily  Orders: T-Basic Metabolic Panel [phone]) T-CBC w/Diff [phone]) T-BNP  (B Natriuretic Peptide) [phone])  His updated medication list for this problem includes:    Aspirin 81 Mg Tbec (Aspirin) .Marland Kitchen... Take one tablet by mouth daily    Isosorbide Mononitrate Cr 30 Mg Xr24h-tab (Isosorbide mononitrate) .Marland Kitchen... Take one tablet by mouth two times a day    Ramipril 10 Mg Caps (Ramipril) .Marland Kitchen... Take one capsule by mouth 2 times per day    Bisoprolol-hydrochlorothiazide 5-6.25 Mg Tabs (Bisoprolol-hydrochlorothiazide) ..... Once daily  Problem # 3:  ICD  - IN SITU-ST JUDE PROMOTE (ICD-V45.02) He has an ICD by the pacemaker with 3 separate procedures as described in the history of present illness. His pacemaker function appears good and has recently been evaluated by Dr. Alberteen Spindle so I think it unlikely that this is contributing to the problem.  Patient Instructions: 1)  Your physician recommends that you have lab work today: bmet/cbc/bnp (427.22;414.01) 2)  Your physician wants you to follow-up in: 6 months with Dr. Clifton James.  You will receive a reminder letter in the mail two months in advance. If you don't receive a letter, please call our office to schedule the follow-up appointment. 3)  If you decide to proceed with a heart catheterization, you may contact Dr. Regino Schultze nurse, Herbert Seta, on tues 7/5 or wed 7/6 to let her know. She will schedule this for you. 4)  Your physician recommends that you continue on your current medications as directed. Please refer to the Current Medication list given to you today.

## 2010-04-24 NOTE — Miscellaneous (Signed)
Summary: Orders Update  Clinical Lists Changes  Problems: Added new problem of SHORTNESS OF BREATH (ICD-786.05) Orders: Added new Test order of T-2 View CXR (71020TC) - Signed 

## 2010-04-24 NOTE — Assessment & Plan Note (Signed)
Summary: f20m   Visit Type:  Follow-up Primary Provider:  Dr Annitta Needs at [place]  CC:  sob.  History of Present Illness: Mr Gladman 77 years old and returns for management of CAD and CHF. He had remote bypass surgery. He has an ischemic cardio myopathy with an ejection fraction of 30% but improved to 35-40% following biventricular pacing. He had an ICD placed in 2009 had an upgrade to a biventricular pacemaker.  Because of symptoms of fatigue he underwent catheterization in July of 2011. He had total occlusion of the vein graft to the right coronary artery and total occlusion of the vein graft to the diagonal branch. Ejection fraction was 40% and his wedge pressure was 22. We increased his diuretic slightly. His creatinines have been in the range of 1.4.  He's done well since his last visit. He's had no chest pain or palpitations. He feels like his exercise count has remained stable.  He is thinking about getting married.  Current Medications (verified): 1)  Aspirin 81 Mg Tbec (Aspirin) .... Take One Tablet By Mouth Daily 2)  Isosorbide Mononitrate Cr 30 Mg Xr24h-Tab (Isosorbide Mononitrate) .... Take One Tablet By Mouth Two Times A Day 3)  Simvastatin 40 Mg Tabs (Simvastatin) .... Take One Tablet By Mouth Daily At Bedtime 4)  Ramipril 10 Mg Caps (Ramipril) .... Take One Capsule By Mouth 2 Times Per Day 5)  Aldactone 25 Mg Tabs (Spironolactone) .... Take 1/2 Tablet Daily 6)  Bisoprolol-Hydrochlorothiazide 5-6.25 Mg Tabs (Bisoprolol-Hydrochlorothiazide) .... Once Daily 7)  Furosemide 40 Mg Tabs (Furosemide) .... Take One Tablet By Mouth Daily. 8)  Multivitamins   Tabs (Multiple Vitamin) .... Centrum Silver Daily  Allergies (verified): No Known Drug Allergies  Past History:  Past Medical History: Reviewed history from 04/06/2009 and no changes required.  COPD (ICD-496) 1. Coronary artery disease status post coronary bypass graft surgery     in 1989 with documented occlusion of the vein  graft to the diagonal     branch of the left anterior descending in 2001. 2. Ischemic cardiomyopathy, ejection fraction of 30% improved 35-40% 5/09 3. Class III systolic congestive heart, improved to class II,     euvolemic. 4. Status post dual-chamber implantable cardioverter-defibrillator     with recent upgrade to a biventricular pacer/implantable     cardioverter-defibrillator.-St. Jude Promote [id number] 5. Hyperlipidemia. 6. Hypertension. 7. Chronic obstructive pulmonary disease.     Review of Systems       ROS is negative except as outlined in HPI.   Vital Signs:  Patient profile:   77 year old male Height:      67 inches Weight:      153 pounds BMI:     24.05 Pulse rate:   63 / minute BP sitting:   116 / 62  (left arm) Cuff size:   large  Vitals Entered By: Burnett Kanaris, CNA (January 09, 2010 9:39 AM)  Physical Exam  Additional Exam:  Gen. Well-nourished, in no distress   Neck: No JVD, thyroid not enlarged, no carotid bruits Lungs: No tachypnea, clear without rales, rhonchi or wheezes Cardiovascular: Rhythm regular, PMI not displaced,  heart sounds  normal, no murmurs or gallops, no peripheral edema, pulses normal in all 4 extremities. Abdomen: BS normal, abdomen soft and non-tender without masses or organomegaly, no hepatosplenomegaly. MS: No deformities, no cyanosis or clubbing   Neuro:  No focal sns   Skin:  no lesions     ICD Specifications Following MD:  Sherryl Manges, MD  Referring MD:  BRODIE ICD Vendor:  St Jude     ICD Model Number:  [id number]     ICD Serial Number:  [id number] ICD DOI:  02/11/2008     ICD Implanting MD:  Sherryl Manges, MD  Lead 1:    Location: RA     DOI: 11/15/1999     Model #: 6940     Serial #: [id number]     Status: active Lead 2:    Location: RV     DOI: 11/15/1999     Model #: 0148     Serial #: [id number]     Status: active Lead 3:    Location: RV     DOI: 02/11/2008     Model #: 1688TC     Serial #: [id number]     Status: active Lead  4:    Location: LV     DOI: 02/11/2008     Model #: 1156T     Serial #: [id number]     Status: active  Indications::  VT; CHF  Explantation Comments: RV rate sense lead  ICD Follow Up ICD Dependent:  No       ICD Device Measurements Configuration: LV ring to RV coil  Episodes Coumadin:  No  Brady Parameters Mode DDD     Lower Rate Limit:  60     Upper Rate Limit 110 PAV 150     Sensed AV Delay:  130  Tachy Zones VF:  214     VT:  187     VT1:  160     Impression & Recommendations:  Problem # 1:  CAD, ARTERY BYPASS GRAFT (ICD-414.04) He is status post remote bypass surgery and had 2 occluded grafts at catheterization in July of 2011. This is managed medically. He has had no chest pain. This appears stable. His updated medication list for this problem includes:    Aspirin 81 Mg Tbec (Aspirin) .Marland Kitchen... Take one tablet by mouth daily    Isosorbide Mononitrate Cr 30 Mg Xr24h-tab (Isosorbide mononitrate) .Marland Kitchen... Take one tablet by mouth two times a day    Ramipril 10 Mg Caps (Ramipril) .Marland Kitchen... Take one capsule by mouth 2 times per day    Bisoprolol-hydrochlorothiazide 5-6.25 Mg Tabs (Bisoprolol-hydrochlorothiazide) ..... Once daily  Orders: TLB-BMP (Basic Metabolic Panel-BMET) (80048-METABOL)  Problem # 2:  SYSTOLIC HEART FAILURE, CHRONIC (ICD-428.22) He has an ischemic cardiomyopathy with an ejection fraction of 30% which improved to 35-40% following biventricular pacing. He appears euvolemic today and I believe this problem is stable. His updated medication list for this problem includes:    Aspirin 81 Mg Tbec (Aspirin) .Marland Kitchen... Take one tablet by mouth daily    Isosorbide Mononitrate Cr 30 Mg Xr24h-tab (Isosorbide mononitrate) .Marland Kitchen... Take one tablet by mouth two times a day    Ramipril 10 Mg Caps (Ramipril) .Marland Kitchen... Take one capsule by mouth 2 times per day    Aldactone 25 Mg Tabs (Spironolactone) .Marland Kitchen... Take 1/2 tablet daily    Bisoprolol-hydrochlorothiazide 5-6.25 Mg Tabs  (Bisoprolol-hydrochlorothiazide) ..... Once daily    Furosemide 40 Mg Tabs (Furosemide) .Marland Kitchen... Take one tablet by mouth daily.  Problem # 3:  ICD - IN SITU-ST JUDE PROMOTE (ICD-V45.02) He has an ICD and biventricular pacemaker. He is pacing both chambers on his ECG today. He was not due for interrogation today.  Problem # 4:  HYPERTENSION, UNSPECIFIED (ICD-401.9) This is well-controlled on current medications. His updated medication list for this problem includes:    Aspirin 81  Mg Tbec (Aspirin) .Marland Kitchen... Take one tablet by mouth daily    Ramipril 10 Mg Caps (Ramipril) .Marland Kitchen... Take one capsule by mouth 2 times per day    Aldactone 25 Mg Tabs (Spironolactone) .Marland Kitchen... Take 1/2 tablet daily    Bisoprolol-hydrochlorothiazide 5-6.25 Mg Tabs (Bisoprolol-hydrochlorothiazide) ..... Once daily    Furosemide 40 Mg Tabs (Furosemide) .Marland Kitchen... Take one tablet by mouth daily.  Orders: TLB-BMP (Basic Metabolic Panel-BMET) (80048-METABOL)  Patient Instructions: 1)  Labwork today: bmet (414.01;428.0). 2)  Your physician recommends that you schedule a follow-up appointment in: 3 months with Dr. Clifton James. 3)  Your physician recommends that you continue on your current medications as directed. Please refer to the Current Medication list given to you today.

## 2010-04-24 NOTE — Procedures (Signed)
Summary: [facility]   Current Medications (verified): 1)  Aspirin 81 Mg Tbec (Aspirin) .... Take One Tablet By Mouth Daily 2)  Isosorbide Mononitrate Cr 30 Mg Xr24h-Tab (Isosorbide Mononitrate) .... Take One Tablet By Mouth Two Times A Day 3)  Simvastatin 40 Mg Tabs (Simvastatin) .... Take One Tablet By Mouth Daily At Bedtime 4)  Ramipril 10 Mg Caps (Ramipril) .... Take One Capsule By Mouth 2 Times Per Day 5)  Aldactone 25 Mg Tabs (Spironolactone) .... Take 1/2 Tablet Daily 6)  Bisoprolol-Hydrochlorothiazide 5-6.25 Mg Tabs (Bisoprolol-Hydrochlorothiazide) .... Once Daily 7)  Furosemide 40 Mg Tabs (Furosemide) .... Take One Tablet By Mouth Daily. 8)  Multivitamins   Tabs (Multiple Vitamin) .... Centrum Silver Daily  Allergies (verified): No Known Drug Allergies   ICD Specifications Following MD:  Sherryl Manges, MD     Referring MD:  Amberlee Garvey ICD Vendor:  St Jude     ICD Model Number:  [id number]     ICD Serial Number:  [id number] ICD DOI:  02/11/2008     ICD Implanting MD:  Sherryl Manges, MD  Lead 1:    Location: RA     DOI: 11/15/1999     Model #: 6940     Serial #: [id number]     Status: active Lead 2:    Location: RV     DOI: 11/15/1999     Model #: 0148     Serial #: [id number]     Status: active Lead 3:    Location: RV     DOI: 02/11/2008     Model #: 1688TC     Serial #: [id number]     Status: active Lead 4:    Location: LV     DOI: 02/11/2008     Model #: 1156T     Serial #: [id number]     Status: active  Indications::  VT; CHF  Explantation Comments: RV rate sense lead  ICD Follow Up Battery Voltage:  3.13 V     Charge Time:  10.8 seconds     Battery Est. Longevity:  4.6 yrs Underlying rhythm:  SR ICD Dependent:  No       ICD Device Measurements [facility]rium:  Amplitude: 3.6 mV, Impedance: 480 ohms, Threshold: 0.75 V at 0.5 msec Right Ventricle:  Amplitude: 4.7 mV, Impedance: 400 ohms, Threshold: 1.25 V at 0.5 msec Left Ventricle:  Impedance: 530 ohms, Threshold: 0.5 V at 0.5  msec Configuration: LV ring to RV coil Shock Impedance: 52 ohms   Episodes MS Episodes:  0     Coumadin:  No Shock:  0     ATP:  0     Nonsustained:  0     Atrial Therapies:  0 Atrial Pacing:  29%     Ventricular Pacing:  88%  Brady Parameters Mode DDD     Lower Rate Limit:  60     Upper Rate Limit 110 PAV 150     Sensed AV Delay:  100  Tachy Zones VF:  214     VT:  187     VT1:  160     Tech Comments:  NORMAL DEVICE FUNCTION.  NO EPISODES SINCE LAST CHECK.  NO CHANGES MADE. MERLIN TRANSMISSION 04-12-10.

## 2010-04-24 NOTE — Assessment & Plan Note (Signed)
Summary: f33m   Primary Provider:  Dr Annitta Needs at [facility]  CC:  follow up 4 months.  Pt spouse reports that he is SOB and tired all the time.  Marland Kitchen  History of Present Illness:    [name] [name] is seen in followup for Ischemic cardiomyopathy congestive heart failure for which he is s/p CRT-D implantation.  He is s/p CABG and most recent EF was about 30-35% in fall of 2009.  He continues to complain of SOB primarily DOE and fatigue.   He has low R waves ranging from 3.7-4.4 most recently; there have been no intercurrent therapies.   The patient denies  chest pain, edema or palpitations   Current Medications (verified): 1)  Coreg 12.5 Mg Tabs (Carvedilol) .... Take 1 Tablet By Mouth Twice A Day 2)  Aspirin 81 Mg Tbec (Aspirin) .... Take One Tablet By Mouth Daily 3)  Isosorbide Dinitrate Cr 40 Mg Cr-Tabs (Isosorbide Dinitrate) .... Take Two Capsules By Mouth At Bedtime 4)  Simvastatin 40 Mg Tabs (Simvastatin) .... Take One Tablet By Mouth Daily At Bedtime 5)  Ramipril 10 Mg Caps (Ramipril) .... Take One Capsule By Mouth 2 Times Per Day  Allergies (verified): No Known Drug Allergies  Past History:  Past Medical History: Last updated: 04/06/2009  COPD (ICD-496) 1. Coronary artery disease status post coronary bypass graft surgery     in 1989 with documented occlusion of the vein graft to the diagonal     branch of the left anterior descending in 2001. 2. Ischemic cardiomyopathy, ejection fraction of 30% improved 35-40% 5/09 3. Class III systolic congestive heart, improved to class II,     euvolemic. 4. Status post dual-chamber implantable cardioverter-defibrillator     with recent upgrade to a biventricular pacer/implantable     cardioverter-defibrillator.-St. Jude Promote [id number] 5. Hyperlipidemia. 6. Hypertension. 7. Chronic obstructive pulmonary disease.     Past Surgical History: Last updated: 04/06/2009 Explantation of a previously implanted device, pocket revision and   implantation of a new defibrillator with intraoperative defibrillation threshold testing. SURGEON:  Duke Salvia, M.D.  12/15/03 Contrast venography and implantation of a dual-chamber defibrillator by  Nathen May, M.D. 11/15/99  St. Jude Promote [id number]  Social History: Last updated: 07/04/2008 Tobacco Use - No.   Vital Signs:  Patient profile:   77 year old male Height:      67 inches Weight:      155 pounds BMI:     24.36 Pulse rate:   68 / minute Pulse rhythm:   regular BP sitting:   178 / 90  (left arm) Cuff size:   regular  Vitals Entered By: Judithe Modest CMA (April 07, 2009 9:06 AM)  Physical Exam  General:  The patient was alert and oriented in no acute distress. HEENT Normal.  Neck veins were flat, carotids were brisk.  Lungs were clear.  Heart sounds were regular without murmurs or gallops.  Abdomen was soft with active bowel sounds. There is no clubbing cyanosis or edema. Skin Warm and dry     ICD Specifications Following MD:  Sherryl Manges, MD     Referring MD:  Juanda Chance ICD Vendor:  St Jude     ICD Model Number:  [id number]     ICD Serial Number:  [id number] ICD DOI:  02/11/2008     ICD Implanting MD:  Sherryl Manges, MD  Lead 1:    Location: RA     DOI: 11/15/1999     Model #: [id number]  Serial #: [id number]     Status: active Lead 2:    Location: RV     DOI: 11/15/1999     Model #: 0148     Serial #: [id number]     Status: active Lead 3:    Location: RV     DOI: 02/11/2008     Model #: [id number]     Serial #: [id number]     Status: active Lead 4:    Location: LV     DOI: 02/11/2008     Model #: 1156T     Serial #: [id number]     Status: active  Indications::  VR; CHF  Explantation Comments: RV rate sense lead  ICD Follow Up Remote Check?  No Battery Voltage:  3.19 V     Charge Time:  10.6 seconds     Battery Est. Longevity:  5.3 YEARS Underlying rhythm:  SR ICD Dependent:  No       ICD Device Measurements Atrium:  Amplitude: 3.6 mV, Impedance: 400 ohms,  Threshold: 0.75 V at 0.5 msec Right Ventricle:  Amplitude: 3.4 mV, Impedance: 340 ohms, Threshold: 1.0 V at 0.5 msec Left Ventricle:  Impedance: 530 ohms, Threshold: 1.0 V at 0.5 msec Configuration: LV ring to RV coil Shock Impedance: 51 ohms   Episodes MS Episodes:  1     Percent Mode Switch:  <1%     Coumadin:  No Shock:  0     ATP:  0     Nonsustained:  0     Atrial Pacing:  6.9%     Ventricular Pacing:  83%  Brady Parameters Mode DDD     Lower Rate Limit:  60     Upper Rate Limit 110 PAV 150     Sensed AV Delay:  100  Tachy Zones VF:  214     VT:  187     VT1:  160     Next Remote Date:  07/03/2009     Next Cardiology Appt Due:  03/26/2010 Tech Comments:  Normal device function. Mode switch episode was less than 1 minute.  R waves still low, but consistent.  RV lead impedence stable.  Question was whether or not we should repeat DFT's.  Pt does Merlin transmissions.  ROV 12 months SK unless other plans for DFT are made. Gypsy Balsam RN BSN  April 07, 2009 9:24 AM   Impression & Recommendations:  Problem # 1:  ICD - IN SITU-ST JUDE PROMOTE (ICD-V45.02) Device parameters and data were reviewed and no changes were made  given the diminished R waves, we will plan to undertake DFT testing to assure adequate sensing of induced VF.    Problem # 2:  CARDIOMYOPATHY, ISCHEMIC (ICD-414.8) H e is without chest pain but given the fatigue we will try a different beta blocker and he is to let u s know in two weeks time whether he is better.  We will try bisoprolol at 5 mg/12.5 The following medications were removed from the medication list:    Coreg 12.5 Mg Tabs (Carvedilol) .Marland Kitchen... Take 1 tablet by mouth twice a day His updated medication list for this problem includes:    Aspirin 81 Mg Tbec (Aspirin) .Marland Kitchen... Take one tablet by mouth daily    Isosorbide Dinitrate Cr 40 Mg Cr-tabs (Isosorbide dinitrate) .Marland Kitchen... Take two capsules by mouth at bedtime    Ramipril 10 Mg Caps (Ramipril) .Marland Kitchen... Take one  capsule by mouth 2 times per day  Bisoprolol Fumarate 5 Mg Tabs (Bisoprolol fumarate) ..... Once daily  Problem # 3:  HYPERTENSION, UNSPECIFIED (ICD-401.9) poorly controlled.  we will add HCTZ to his bisoprlol and also aldactone for his cardiomyopathy  We have reviewed the side effects incl gynocomastia and hyperkalemia  We will plan to check his potassium in two weeks time when we do DFT The following medications were removed from the medication list:    Coreg 12.5 Mg Tabs (Carvedilol) .Marland Kitchen... Take 1 tablet by mouth twice a day His updated medication list for this problem includes:    Aspirin 81 Mg Tbec (Aspirin) .Marland Kitchen... Take one tablet by mouth daily    Ramipril 10 Mg Caps (Ramipril) .Marland Kitchen... Take one capsule by mouth 2 times per day    Bisoprolol Fumarate 5 Mg Tabs (Bisoprolol fumarate) ..... Once daily    Aldactone 25 Mg Tabs (Spironolactone) .Marland Kitchen... Take 1/2 tablet daily  Problem # 4:  SYSTOLIC HEART FAILURE, CHRONIC (ICD-428.22) see the above The following medications were removed from the medication list:    Coreg 12.5 Mg Tabs (Carvedilol) .Marland Kitchen... Take 1 tablet by mouth twice a day    Bisoprolol Fumarate 5 Mg Tabs (Bisoprolol fumarate) ..... Once daily His updated medication list for this problem includes:    Aspirin 81 Mg Tbec (Aspirin) .Marland Kitchen... Take one tablet by mouth daily    Isosorbide Dinitrate Cr 40 Mg Cr-tabs (Isosorbide dinitrate) .Marland Kitchen... Take two capsules by mouth at bedtime    Ramipril 10 Mg Caps (Ramipril) .Marland Kitchen... Take one capsule by mouth 2 times per day    Aldactone 25 Mg Tabs (Spironolactone) .Marland Kitchen... Take 1/2 tablet daily    Bisoprolol-hydrochlorothiazide 5-6.25 Mg Tabs (Bisoprolol-hydrochlorothiazide) ..... Once daily  Patient Instructions: 1)  Your physician has recommended you make the following change in your medication: STOP COREG. 2)  START BISOPROLOL-HCTZ 5-6.25 MG DAILY 3)  START ALDACTONE 12.5MG  DAILY 4)  Your physician recommends that you HAVE A DFT CHECK IN 2 WEEKS 5)   Your physician recommends that you schedule a follow-up appointment in: 12 MONTHS Prescriptions: BISOPROLOL-HYDROCHLOROTHIAZIDE 5-6.25 MG TABS (BISOPROLOL-HYDROCHLOROTHIAZIDE) once daily  #30 x 11   Entered by:   Duncan Dull, RN, BSN   Authorized by:   Nathen May, MD, [facility]   Signed by:   Duncan Dull, RN, BSN on 04/07/2009   Method used:   Electronically to        [facility]  [phone]* (retail)       [address]       [place], [place]  [address]       Ph: [phone] or [phone]       Fax: [phone]   RxID:   [id number] ALDACTONE 25 MG TABS (SPIRONOLACTONE) TAKE 1/2 TABLET DAILY  #15 x 11   Entered by:   Duncan Dull, RN, BSN   Authorized by:   Nathen May, MD, [facility]   Signed by:   Duncan Dull, RN, BSN on 04/07/2009   Method used:   Electronically to        [facility]  [phone]* (retail)       [address]       [place], [place]  [address]       Ph: [phone] or [phone]       Fax: [phone]   RxID:   [id number] BISOPROLOL FUMARATE 5 MG TABS (BISOPROLOL FUMARATE) once daily  #30 x  11   Entered by:   Duncan Dull, RN, BSN   Authorized by:   Nathen May, MD, [facility]   Signed by:   Duncan Dull, RN, BSN on 04/07/2009   Method used:   Electronically to        [facility]  [phone]* (retail)       [address]       [place], [place]  [address]       Ph: [phone] or [phone]       Fax: [phone]   RxID:   [id number]

## 2010-04-24 NOTE — Cardiovascular Report (Signed)
Summary: Pre Cath Orders   Pre Cath Orders   Imported By: Roderic Ovens 10/06/2009 11:11:41  _____________________________________________________________________  External Attachment:    Type:   Image     Comment:   External Document

## 2010-04-24 NOTE — Letter (Signed)
Summary: Remote Device Check  [facility], Main Office  [address]   [place], [place] [address]   Phone: [phone]  Fax: [phone]     October 06, 2009 MRN: [id number]   [facility] [address] [place], [place]  [address]   Dear Mr. FORSTROM,   Your remote transmission was recieved and reviewed by your physician.  All diagnostics were within normal limits for you.  __X___Your next transmission is scheduled for:  12-21-2009.  Please transmit at any time this day.  If you have a wireless device your transmission will be sent automatically.   Sincerely,  Vella Kohler

## 2010-04-24 NOTE — Assessment & Plan Note (Signed)
Summary: f82m   Visit Type:  Follow-up Primary Provider:  Dr Annitta Needs at [place]  CC:  no complaints.  History of Present Illness: The patient is 77 years old and return for management of CAD and CHF. He had bypass surgery in 1989 and documented occlusion of the vein graft to the diagonal branch of the LAD in 2001. He has an ischemic or adenopathy with an ejection fraction of 30% this improved to 35-40% following ICD and by the pacer implant. In November of 2009 he went underwent an upgrade of his ICD with placement of a new sensing lead due to sensing problems with the ventricular lead. Recent checks of his ICD indicated low R waves and there is concern that his R waves may not be adequate to detect and treat the fibrillation should it occur.  Otherwise he has been doing well. He's had no chest pain and no shortness of breath. He occasionally notices that his pulse gets when he checks his pulse but he cannot feel this.  Current Medications (verified): 1)  Coreg 12.5 Mg Tabs (Carvedilol) .... Take 1 Tablet By Mouth Twice A Day 2)  Aspirin 81 Mg Tbec (Aspirin) .... Take One Tablet By Mouth Daily 3)  Isosorbide Dinitrate Cr 40 Mg Cr-Tabs (Isosorbide Dinitrate) .... Take Two Capsules By Mouth At Bedtime 4)  Simvastatin 40 Mg Tabs (Simvastatin) .... Take One Tablet By Mouth Daily At Bedtime 5)  Ramipril 10 Mg Caps (Ramipril) .... Take One Capsule By Mouth 2 Times Per Day  Allergies (verified): No Known Drug Allergies  Past History:  Past Medical History: Reviewed history from 08/08/2008 and no changes required.  COPD (ICD-496) 1. Coronary artery disease status post coronary bypass graft surgery     in 1989 with documented occlusion of the vein graft to the diagonal     branch of the left anterior descending in 2001. 2. Ischemic cardiomyopathy, ejection fraction of 30% improved 35-40% 5/09 3. Class III systolic congestive heart, improved to class II,     euvolemic. 4. Status post  dual-chamber implantable cardioverter-defibrillator     with recent upgrade to a biventricular pacer/implantable     cardioverter-defibrillator. 5. Hyperlipidemia. 6. Hypertension. 7. Chronic obstructive pulmonary disease.     Review of Systems       ROS is negative except as outlined in HPI.   Vital Signs:  Patient profile:   77 year old male Height:      67 inches Weight:      154 pounds BMI:     24.21 Pulse rate:   64 / minute BP sitting:   150 / 77  (left arm) Cuff size:   regular  Vitals Entered By: Burnett Kanaris, CNA (March 28, 2009 9:18 AM)  Physical Exam  Additional Exam:  Gen. Well-nourished, in no distress   Neck: No JVD, thyroid not enlarged, no carotid bruits Lungs: No tachypnea, clear without rales, rhonchi or wheezes Cardiovascular: Rhythm regular, PMI not displaced,  heart sounds  normal, no murmurs or gallops, no peripheral edema, pulses normal in all 4 extremities. Abdomen: BS normal, abdomen soft and non-tender without masses or organomegaly, no hepatosplenomegaly. MS: No deformities, no cyanosis or clubbing   Neuro:  No focal sns   Skin:  no lesions     ICD Specifications Following MD:  Sherryl Manges, MD     Referring MD:  Juanda Chance ICD Vendor:  St Jude     ICD Model Number:  [id number]     ICD Serial  Number:  [id number] ICD DOI:  02/11/2008     ICD Implanting MD:  Sherryl Manges, MD  Lead 1:    Location: RA     DOI: 11/15/1999     Model #: 6940     Serial #: [id number]     Status: active Lead 2:    Location: RV     DOI: 11/15/1999     Model #: 0148     Serial #: [id number]     Status: active Lead 3:    Location: RV     DOI: 02/11/2008     Model #: [id number]     Serial #: [id number]     Status: active Lead 4:    Location: LV     DOI: 02/11/2008     Model #: 1156T     Serial #: [id number]     Status: active  Indications::  VR; CHF  Explantation Comments: RV rate sense lead  ICD Follow Up ICD Dependent:  No       ICD Device Measurements Configuration: LV ring to RV  coil  Episodes Coumadin:  No  Brady Parameters Mode DDD     Lower Rate Limit:  60     Upper Rate Limit 110 PAV 150     Sensed AV Delay:  100  Tachy Zones VF:  214     VT:  187     VT1:  160     Impression & Recommendations:  Problem # 1:  SYSTOLIC HEART FAILURE, CHRONIC (ICD-428.22) He appears euvolemic and compensated today. This problem is stable. We will continue current therapy. His updated medication list for this problem includes:    Coreg 12.5 Mg Tabs (Carvedilol) .Marland Kitchen... Take 1 tablet by mouth twice a day    Aspirin 81 Mg Tbec (Aspirin) .Marland Kitchen... Take one tablet by mouth daily    Isosorbide Dinitrate Cr 40 Mg Cr-tabs (Isosorbide dinitrate) .Marland Kitchen... Take two capsules by mouth at bedtime    Ramipril 10 Mg Caps (Ramipril) .Marland Kitchen... Take one capsule by mouth 2 times per day  Problem # 2:  CAD, ARTERY BYPASS GRAFT (ICD-414.04) He has had previous bypass surgery and one graft is known to be occluded. He has had no chest pain. This problem appears stable. His updated medication list for this problem includes:    Coreg 12.5 Mg Tabs (Carvedilol) .Marland Kitchen... Take 1 tablet by mouth twice a day    Aspirin 81 Mg Tbec (Aspirin) .Marland Kitchen... Take one tablet by mouth daily    Isosorbide Dinitrate Cr 40 Mg Cr-tabs (Isosorbide dinitrate) .Marland Kitchen... Take two capsules by mouth at bedtime    Ramipril 10 Mg Caps (Ramipril) .Marland Kitchen... Take one capsule by mouth 2 times per day  Problem # 3:  HYPERTENSION, UNSPECIFIED (ICD-401.9) His blood pressure is borderline elevated today but he says he checks it at [facility] and it's 130/75. We will not make any changes in his therapy today. His updated medication list for this problem includes:    Coreg 12.5 Mg Tabs (Carvedilol) .Marland Kitchen... Take 1 tablet by mouth twice a day    Aspirin 81 Mg Tbec (Aspirin) .Marland Kitchen... Take one tablet by mouth daily    Ramipril 10 Mg Caps (Ramipril) .Marland Kitchen... Take one capsule by mouth 2 times per day  Problem # 4:  ICD - IN SITU (ICD-V45.02) He has a potential problem with  the sensing lead on his ICD. The R waves are low. He is scheduled to see Dr. Graciela Husbands on January 14 and a decision  will be made whether he needs to come in for check of DFTs.  Patient Instructions: 1)  Your physician wants you to follow-up in: 6 months. You will receive a reminder letter in the mail two months in advance. If you don't receive a letter, please call our office to schedule the follow-up appointment. 2)  Keep your appointment with Dr. Graciela Husbands on 04/07/09.

## 2010-04-24 NOTE — Cardiovascular Report (Signed)
Summary: Office Visit   Office Visit   Imported By: Roderic Ovens 04/12/2009 14:36:53  _____________________________________________________________________  External Attachment:    Type:   Image     Comment:   External Document

## 2010-04-24 NOTE — Progress Notes (Signed)
Summary: returned call  Phone Note Call from Patient   Reason for Call: Talk to Nurse Summary of Call: returned call to heather-pls call [phone] Initial call taken by: Glynda Jaeger,  October 17, 2009 9:14 AM  Follow-up for Phone Call        I spoke with the pt. Follow-up by: Sherri Rad, RN, BSN,  October 17, 2009 10:14 AM

## 2010-04-24 NOTE — Cardiovascular Report (Signed)
Summary: Office Visit   Office Visit   Imported By: Roderic Ovens 11/10/2009 13:46:04  _____________________________________________________________________  External Attachment:    Type:   Image     Comment:   External Document

## 2010-04-24 NOTE — Miscellaneous (Signed)
Summary: Orders Update  Clinical Lists Changes  Orders: Added new Referral order of Cardiac Catheterization (Cardiac Cath) - Signed 

## 2010-04-24 NOTE — Letter (Signed)
Summary: Cardiac Catheterization Instructions- [facility]  [facility], Main Office  [address]   [place], [place] [address]   Phone: [phone]  Fax: [phone]     09/26/2009 MRN: [id number]  [facility] [place] [address] [place], [place]  [address]  Dear Mr. GOLSON,   You are scheduled for a Cardiac Catheterization on Friday 10/06/09 with Dr.Brodie  Please arrive to the 1st floor of the Heart and Vascular Center at [facility] at 10:30 am on the day of your procedure. Please do not arrive before 6:30 a.m. Call the Heart and Vascular Center at [phone] if you are unable to make your appointmnet. The Code to get into the parking garage under the building is 1000. Take the elevators to the 1st floor. You must have someone to drive you home. Someone must be with you for the first 24 hours after you arrive home. Please wear clothes that are easy to get on and off and wear slip-on shoes. Do not eat or drink after midnight except water with your medications that morning. Bring all your medications and current insurance cards with you.  _x__ DO NOT take these medications before your procedure: - hold aldactone the morning of your procedure.  _x__ Make sure you take your aspirin.  _x__ You may take ALL of your other medications with water that morning. ________________________________________________________________________________________________________________________________     The usual length of stay after your procedure is 2 to 3 hours. This can vary.  If you have any questions, please call the office at the number listed above.   Sherri Rad, RN, BSN

## 2010-04-24 NOTE — Progress Notes (Signed)
Summary: rtn call  Phone Note Call from Patient Call back at Home Phone [phone]   Caller: Patient Reason for Call: Talk to Nurse, Talk to Doctor Summary of Call: pt rtn call from [facility] he thinks it is regarding his lab work Initial call taken by: Omer Jack,  January 12, 2010 9:05 AM  Follow-up for Phone Call        Pt aware of lab results. Mylo Red RN

## 2010-04-24 NOTE — Cardiovascular Report (Signed)
Summary: Office Visit   Office Visit   Imported By: Roderic Ovens 11/10/2009 13:46:24  _____________________________________________________________________  External Attachment:    Type:   Image     Comment:   External Document

## 2010-04-24 NOTE — Assessment & Plan Note (Signed)
Summary: percert/saf   Primary Provider:  Dr Annitta Needs at [facility]  CC:  precert. Pt states that he is feeling good.  Marland Kitchen  History of Present Illness:    [name] [name] is seen in followup for Ischemic cardiomyopathy congestive heart failure for which he is s/p CRT-D implantation.  He is s/p CABG and most recent EF was about 30-35% in fall of 2009.  He continues to complain of SOB primarily DOE and fatigue.   He has low R waves ranging from 3.7-4.4 most recently; because of this he underwent repeat DFT testing to assess for adequate sensing. This was normal.  He says his breathing is much better since his AV optimization echo; he is hoping that there is greater benefit to be gained. He's not having chest pain or shortness of breath.   Current Medications (verified): 1)  Aspirin 81 Mg Tbec (Aspirin) .... Take One Tablet By Mouth Daily 2)  Isosorbide Dinitrate Cr 40 Mg Cr-Tabs (Isosorbide Dinitrate) .... Take Two Capsules By Mouth At Bedtime 3)  Simvastatin 40 Mg Tabs (Simvastatin) .... Take One Tablet By Mouth Daily At Bedtime 4)  Ramipril 10 Mg Caps (Ramipril) .... Take One Capsule By Mouth 2 Times Per Day 5)  Aldactone 25 Mg Tabs (Spironolactone) .... Take 1/2 Tablet Daily 6)  Bisoprolol-Hydrochlorothiazide 5-6.25 Mg Tabs (Bisoprolol-Hydrochlorothiazide) .... Once Daily  Allergies (verified): No Known Drug Allergies  Past History:  Past Medical History: Last updated: 04/06/2009  COPD (ICD-496) 1. Coronary artery disease status post coronary bypass graft surgery     in 1989 with documented occlusion of the vein graft to the diagonal     branch of the left anterior descending in 2001. 2. Ischemic cardiomyopathy, ejection fraction of 30% improved 35-40% 5/09 3. Class III systolic congestive heart, improved to class II,     euvolemic. 4. Status post dual-chamber implantable cardioverter-defibrillator     with recent upgrade to a biventricular pacer/implantable  cardioverter-defibrillator.-St. Jude Promote [id number] 5. Hyperlipidemia. 6. Hypertension. 7. Chronic obstructive pulmonary disease.     Past Surgical History: Last updated: 04/06/2009 Explantation of a previously implanted device, pocket revision and  implantation of a new defibrillator with intraoperative defibrillation threshold testing. SURGEON:  Duke Salvia, M.D.  12/15/03 Contrast venography and implantation of a dual-chamber defibrillator by  Nathen May, M.D. 11/15/99  St. Jude Promote [id number]  Social History: Last updated: 07/04/2008 Tobacco Use - No.   Vital Signs:  Patient profile:   77 year old male Height:      67 inches Weight:      157 pounds BMI:     24.68 Pulse rate:   65 / minute Pulse rhythm:   regular BP sitting:   149 / 68  (left arm) Cuff size:   large  Vitals Entered By: Judithe Modest CMA (June 20, 2009 9:57 AM)  Physical Exam  General:  Well developed, well nourished, in no acute distress. Head:  HEENT normal Neck:  neck veins7 cm; supple Lungs:  clear Heart:  regular rate and rhythm without murmurs. S4 is present Abdomen:  soft nontender without hepatomegaly Msk:  Back normal, normal gait. Muscle strength and tone normal. Extremities:  No clubbing or cyanosis.; is no edema Neurologic:  Alert and oriented x 3. Skin:  Intact without lesions or rashes.    ICD Specifications Following MD:  Sherryl Manges, MD     Referring MD:  Juanda Chance ICD Vendor:  [facility]     ICD Model Number:  [id number]  ICD Serial Number:  [id number] ICD DOI:  02/11/2008     ICD Implanting MD:  Sherryl Manges, MD  Lead 1:    Location: RA     DOI: 11/15/1999     Model #: 6940     Serial #: [id number]     Status: active Lead 2:    Location: RV     DOI: 11/15/1999     Model #: 0148     Serial #: [id number]     Status: active Lead 3:    Location: RV     DOI: 02/11/2008     Model #: [id number]     Serial #: [id number]     Status: active Lead 4:    Location: LV     DOI: 02/11/2008      Model #: 1156T     Serial #: [id number]     Status: active  Indications::  VT; CHF  Explantation Comments: RV rate sense lead  ICD Follow Up Remote Check?  No Battery Voltage:  3.19 V     Charge Time:  10.5 seconds     Battery Est. Longevity:  5.3 years Underlying rhythm:  SR/PVC's ICD Dependent:  No       ICD Device Measurements Atrium:  Amplitude: 2.0 mV, Impedance: 410 ohms, Threshold: 0.75 V at 0.5 msec Right Ventricle:  Amplitude: 3.8 mV, Impedance: 340 ohms, Threshold: 1.25 V at 0.5 msec Left Ventricle:  Impedance: 540 ohms, Threshold: 0.5 V at 0.5 msec Configuration: LV ring to RV coil  Episodes MS Episodes:  0     Percent Mode Switch:  0     Coumadin:  No Shock:  0     ATP:  0     Nonsustained:  0     Atrial Pacing:  29%     Ventricular Pacing:  97%  Brady Parameters Mode DDD     Lower Rate Limit:  60     Upper Rate Limit 110 PAV 150     Sensed AV Delay:  130  Tachy Zones VF:  214     VT:  187     VT1:  160     Next Remote Date:  09/18/2009     Next Cardiology Appt Due:  05/24/2010 Tech Comments:  The patient is c/o some SOB on exertion.  He also tells me his activity level has been decreased because of the winter weather.  Quick opt done, SAV reprogrammed 130 msec. NSR today with frequent PVC's.  Merlin transmissions every 3 months.  ROV 1 year with Dr. Graciela Husbands. [name] Harm, LPN  June 20, 2009 10:14 AM   Impression & Recommendations:  Problem # 1:  SYSTOLIC HEART FAILURE, CHRONIC (ICD-428.22) Congestive failure symptoms are improved following his AV optimization echo. Will continue him on his current medications. His updated medication list for this problem includes:    Aspirin 81 Mg Tbec (Aspirin) .Marland Kitchen... Take one tablet by mouth daily    Isosorbide Dinitrate Cr 40 Mg Cr-tabs (Isosorbide dinitrate) .Marland Kitchen... Take two capsules by mouth at bedtime    Ramipril 10 Mg Caps (Ramipril) .Marland Kitchen... Take one capsule by mouth 2 times per day    Aldactone 25 Mg Tabs (Spironolactone) .Marland Kitchen...  Take 1/2 tablet daily    Bisoprolol-hydrochlorothiazide 5-6.25 Mg Tabs (Bisoprolol-hydrochlorothiazide) ..... Once daily  Problem # 2:  CARDIOMYOPATHY, ISCHEMIC (ICD-414.8) stable without chest pain His updated medication list for this problem includes:    Aspirin 81 Mg Tbec (Aspirin) .Marland Kitchen... Take one tablet  by mouth daily    Isosorbide Dinitrate Cr 40 Mg Cr-tabs (Isosorbide dinitrate) .Marland Kitchen... Take two capsules by mouth at bedtime    Ramipril 10 Mg Caps (Ramipril) .Marland Kitchen... Take one capsule by mouth 2 times per day    Aldactone 25 Mg Tabs (Spironolactone) .Marland Kitchen... Take 1/2 tablet daily    Bisoprolol-hydrochlorothiazide 5-6.25 Mg Tabs (Bisoprolol-hydrochlorothiazide) ..... Once daily  Problem # 3:  ICD - IN SITU-ST JUDE PROMOTE (ICD-V45.02) Device parameters and data were reviewed and no changes were made  Patient Instructions: 1)  Your physician wants you to follow-up in: 12 months with Dr Graciela Husbands.  You will receive a reminder letter in the mail two months in advance. If you don't receive a letter, please call our office to schedule the follow-up appointment.

## 2010-04-24 NOTE — Cardiovascular Report (Signed)
Summary: Office Visit   Office Visit   Imported By: Roderic Ovens 06/22/2009 10:42:54  _____________________________________________________________________  External Attachment:    Type:   Image     Comment:   External Document

## 2010-04-24 NOTE — Cardiovascular Report (Signed)
Summary: Office Visit   Office Visit   Imported By: Roderic Ovens 01/11/2010 12:04:46  _____________________________________________________________________  External Attachment:    Type:   Image     Comment:   External Document

## 2010-04-24 NOTE — Assessment & Plan Note (Signed)
Summary: eph   Visit Type:  eph Primary Provider:  Dr Annitta Needs at [facility]  CC:  still SOB.  History of Present Illness: the patient is 77 years old and returned for followup visit after his recent catheterization. He had bypass surgery in 1989. His ejection fraction was 30%. An ICD and by the pacer placed and had an upgrade in 2009 and his ejection fraction improved to 35-40%. Recently he has had increased symptoms of shortness of breath and fatigue. We have allowed him with catheterization and found that to his grafts were occluded. This was a graft to the diagonal branch and to the right coronary. He had inferior wall akinesis with a near aneurysm and an ejection fraction of 40%. His wedge was 22 mm.  We started him on Imdur and Lasix. Says he is feeling better with less shortness of breath.  Current Medications (verified): 1)  Aspirin 81 Mg Tbec (Aspirin) .... Take One Tablet By Mouth Daily 2)  Isosorbide Mononitrate Cr 30 Mg Xr24h-Tab (Isosorbide Mononitrate) .... Take One Tablet By Mouth Two Times A Day 3)  Simvastatin 40 Mg Tabs (Simvastatin) .... Take One Tablet By Mouth Daily At Bedtime 4)  Ramipril 10 Mg Caps (Ramipril) .... Take One Capsule By Mouth 2 Times Per Day 5)  Aldactone 25 Mg Tabs (Spironolactone) .... Take 1/2 Tablet Daily 6)  Bisoprolol-Hydrochlorothiazide 5-6.25 Mg Tabs (Bisoprolol-Hydrochlorothiazide) .... Once Daily 7)  Furosemide 40 Mg Tabs (Furosemide) .... Take One Tablet By Mouth Daily.  Allergies (verified): No Known Drug Allergies  Past History:  Past Medical History: Reviewed history from 04/06/2009 and no changes required.  COPD (ICD-496) 1. Coronary artery disease status post coronary bypass graft surgery     in 1989 with documented occlusion of the vein graft to the diagonal     branch of the left anterior descending in 2001. 2. Ischemic cardiomyopathy, ejection fraction of 30% improved 35-40% 5/09 3. Class III systolic congestive heart, improved to  class II,     euvolemic. 4. Status post dual-chamber implantable cardioverter-defibrillator     with recent upgrade to a biventricular pacer/implantable     cardioverter-defibrillator.-St. Jude Promote [id number] 5. Hyperlipidemia. 6. Hypertension. 7. Chronic obstructive pulmonary disease.     Review of Systems       ROS is negative except as outlined in HPI.   Vital Signs:  Patient profile:   77 year old male Height:      67 inches Weight:      153 pounds BMI:     24.05 Pulse rate:   63 / minute BP sitting:   122 / 56  (left arm)  Physical Exam  Additional Exam:  Gen. Well-nourished, in no distress   Neck: No JVD, thyroid not enlarged, no carotid bruits Lungs: No tachypnea, clear without rales, rhonchi or wheezes Cardiovascular: Rhythm regular, PMI not displaced,  heart sounds  normal, no murmurs or gallops, no peripheral edema, pulses normal in all 4 extremities. Abdomen: BS normal, abdomen soft and non-tender without masses or organomegaly, no hepatosplenomegaly. MS: No deformities, no cyanosis or clubbing   Neuro:  No focal sns   Skin:  no lesions     ICD Specifications Following MD:  Sherryl Manges, MD     Referring MD:  Juanda Chance ICD Vendor:  [employer] Jude     ICD Model Number:  [id number]     ICD Serial Number:  [id number] ICD DOI:  02/11/2008     ICD Implanting MD:  Sherryl Manges, MD  Lead  1:    Location: RA     DOI: 11/15/1999     Model #: 6940     Serial #: [id number]     Status: active Lead 2:    Location: RV     DOI: 11/15/1999     Model #: 0148     Serial #: [id number]     Status: active Lead 3:    Location: RV     DOI: 02/11/2008     Model #: [id number]     Serial #: [id number]     Status: active Lead 4:    Location: LV     DOI: 02/11/2008     Model #: 1156T     Serial #: [id number]     Status: active  Indications::  VT; CHF  Explantation Comments: RV rate sense lead  ICD Follow Up Remote Check?  No Battery Voltage:  3.14 V     Charge Time:  10.8 seconds     Battery Est. Longevity:   4.7 years Underlying rhythm:  SR ICD Dependent:  No       ICD Device Measurements Atrium:  Amplitude: 2.3 mV, Impedance: 410 ohms, Threshold: 0.75 V at 0.5 msec Right Ventricle:  Amplitude: 3.7 mV, Impedance: 390 ohms, Threshold: 1.25 V at 0.5 msec Left Ventricle:  Impedance: 540 ohms, Threshold: 0.5 V at 0.5 msec Configuration: LV ring to RV coil Shock Impedance: 49 ohms   Episodes MS Episodes:  0     Percent Mode Switch:  0     Coumadin:  No Shock:  0     ATP:  0     Nonsustained:  0     Atrial Pacing:  28%     Ventricular Pacing:  87%  Brady Parameters Mode DDD     Lower Rate Limit:  60     Upper Rate Limit 110 PAV 150     Sensed AV Delay:  130  Tachy Zones VF:  214     VT:  187     VT1:  160     Tech Comments:  quick opt done today IV pace delay reprogrammed 92ms(LV->RV).  Auto mode switch reprogrammed DDTR.  R-waves chronic @ 3.73mV.    Merlin transmissions every 3 months.  ROV 3/12 with Dr. Graciela Husbands. Altha Harm, LPN  November 09, 2009 9:35 AM   Impression & Recommendations:  Problem # 1:  SYSTOLIC HEART FAILURE, CHRONIC (ICD-428.22) At recent catheterization his ejection fraction is 40% and his wedge pressure was 22. Creatinine Lasix and he feels better. He appears euvolemic today. His BMP was unchanged with a creatinine of 1.4 and a potassium of 4.8. We will continue current therapy and planned followup in 2 months and get a BMP then. His updated medication list for this problem includes:    Aspirin 81 Mg Tbec (Aspirin) .Marland Kitchen... Take one tablet by mouth daily    Isosorbide Mononitrate Cr 30 Mg Xr24h-tab (Isosorbide mononitrate) .Marland Kitchen... Take one tablet by mouth two times a day    Ramipril 10 Mg Caps (Ramipril) .Marland Kitchen... Take one capsule by mouth 2 times per day    Aldactone 25 Mg Tabs (Spironolactone) .Marland Kitchen... Take 1/2 tablet daily    Bisoprolol-hydrochlorothiazide 5-6.25 Mg Tabs (Bisoprolol-hydrochlorothiazide) ..... Once daily    Furosemide 40 Mg Tabs (Furosemide) .Marland Kitchen... Take one tablet by  mouth daily.  Problem # 2:  CAD, ARTERY BYPASS GRAFT (ICD-414.04) Olegario Messier has had prior bypass surgery and now has to occluded vein grafts. He is not  a candidate for revascularization. We will continue current medical therapy. His updated medication list for this problem includes:    Aspirin 81 Mg Tbec (Aspirin) .Marland Kitchen... Take one tablet by mouth daily    Isosorbide Mononitrate Cr 30 Mg Xr24h-tab (Isosorbide mononitrate) .Marland Kitchen... Take one tablet by mouth two times a day    Ramipril 10 Mg Caps (Ramipril) .Marland Kitchen... Take one capsule by mouth 2 times per day    Bisoprolol-hydrochlorothiazide 5-6.25 Mg Tabs (Bisoprolol-hydrochlorothiazide) ..... Once daily  Orders: EKG w/ Interpretation (93000)  Problem # 3:  ICD - IN SITU-ST JUDE PROMOTE (ICD-V45.02) He has an ICD and biventricular pacer. We will plan to interrogate that today.  Patient Instructions: 1)  Your physician recommends that you schedule a follow-up appointment in: 2 months. 2)  Your physician recommends that you continue on your current medications as directed. Please refer to the Current Medication list given to you today.

## 2010-04-26 NOTE — Assessment & Plan Note (Signed)
Summary: f1m   Visit Type:  3 month follow up Primary Provider:  Dr Annitta Needs at [facility]  CC:  no complaints.  History of Present Illness: 77 yo WM with history of CAD and CHF who has been followed in the past by Dr. Juanda Chance. He is here today to establish care with me. He had remote bypass surgery. He has an ischemic cardio myopathy with an ejection fraction of 30% but improved to 35-40% following biventricular pacing. He had an ICD placed in 2009 had an upgrade to a biventricular pacemaker. Because of symptoms of fatigue he underwent catheterization in July of 2011. He had total occlusion of the vein graft to the right coronary artery and total occlusion of the vein graft to the diagonal branch. Ejection fraction was 40% and his wedge pressure was 22.   He is here today for 3 month follow up. He is doing well except for sinus issues and cough. No fevers or chills. Cough is mostly non-productive. NO chest pain. Breathing is ok. No dizziness, near syncope or syncope.     Current Medications (verified): 1)  Aspirin 81 Mg Tbec (Aspirin) .... Take One Tablet By Mouth Daily 2)  Isosorbide Mononitrate Cr 30 Mg Xr24h-Tab (Isosorbide Mononitrate) .... Take One Tablet By Mouth Two Times A Day 3)  Simvastatin 40 Mg Tabs (Simvastatin) .... Take One Tablet By Mouth Daily At Bedtime 4)  Ramipril 10 Mg Caps (Ramipril) .... Take One Capsule By Mouth 2 Times Per Day 5)  Aldactone 25 Mg Tabs (Spironolactone) .... Take 1/2 Tablet Daily 6)  Bisoprolol-Hydrochlorothiazide 5-6.25 Mg Tabs (Bisoprolol-Hydrochlorothiazide) .... Once Daily 7)  Furosemide 40 Mg Tabs (Furosemide) .... Take One Tablet By Mouth Daily. 8)  Multivitamins   Tabs (Multiple Vitamin) .... Centrum Silver Daily  Allergies (verified): No Known Drug Allergies  Past History:  Past Surgical History: Explantation of a previously implanted device, pocket revision and  implantation of a new defibrillator with intraoperative defibrillation threshold  testing. SURGEON:  Duke Salvia, M.D.  12/15/03 Contrast venography and implantation of a dual-chamber defibrillator by  Nathen May, M.D. 11/15/99  St. Jude Promote [id number]  5VCABG 1989   Family History: Noncontributory  Social History: Tobacco Use - No. Former. smoked for 43 years, 1ppd. Stopped 1989 NO alcohol No illicit drug use Married. 5 children. 3 are adopted.   Vital Signs:  Patient profile:   77 year old male Height:      67 inches Weight:      151.75 pounds BMI:     23.85 Pulse rate:   64 / minute BP sitting:   108 / 64  (left arm) Cuff size:   regular  Vitals Entered By: Caralee Ates CMA (April 12, 2010 1:43 PM)   Cardiac Cath  Procedure date:  10/06/2009  Findings:      ANGIOGRAPHIC DATA:  Left main coronary artery:  The left main coronary artery was totally occluded near its origin.   Right coronary artery:  The right coronary artery was totally occluded proximally.   The saphenous vein graft to the marginal and posterolateral branch of the circumflex artery was patent.  There was 50% narrowing in the distal limb of the graft.   The LIMA graft to LAD was patent and functioned normally.  There was 95% stenosis in the LAD at the very tip of the vessel.  The saphenous vein graft to diagonal branch of the LAD was completely occluded at its origin (this was an old occlusion).  The saphenous vein graft to the right coronary artery was completely occluded near its origin.  This is an interim occlusion from the previous study.   Left ventriculogram:  The left ventriculogram performed in the RAO projection showed good wall motion in the anterolateral wall and apex. The inferobasal wall was akinetic to dyskinetic and almost aneurysmal. The estimated ejection fraction was 40%.   There were collaterals from the circumflex artery through the vein graft to the distal right coronary artery.     ICD Specifications Following MD:  Sherryl Manges,  MD     Referring MD:  BRODIE ICD Vendor:  [facility] Jude     ICD Model Number:  [id number]     ICD Serial Number:  [id number] ICD DOI:  02/11/2008     ICD Implanting MD:  Sherryl Manges, MD  Lead 1:    Location: RA     DOI: 11/15/1999     Model #: 6940     Serial #: [id number]     Status: active Lead 2:    Location: RV     DOI: 11/15/1999     Model #: 0148     Serial #: [id number]     Status: active Lead 3:    Location: RV     DOI: 02/11/2008     Model #: 1688TC     Serial #: [id number]     Status: active Lead 4:    Location: LV     DOI: 02/11/2008     Model #: 1156T     Serial #: [id number]     Status: active  Indications::  VT; CHF  Explantation Comments: RV rate sense lead  ICD Follow Up ICD Dependent:  No       ICD Device Measurements Configuration: LV ring to RV coil  Episodes Coumadin:  No  Brady Parameters Mode DDD     Lower Rate Limit:  60     Upper Rate Limit 110 PAV 150     Sensed AV Delay:  100  Tachy Zones VF:  214     VT:  187     VT1:  160     Impression & Recommendations:  Problem # 1:  CAD, ARTERY BYPASS GRAFT (ICD-414.04) Stable. No changes in therapy.   His updated medication list for this problem includes:    Aspirin 81 Mg Tbec (Aspirin) .Marland Kitchen... Take one tablet by mouth daily    Isosorbide Mononitrate Cr 30 Mg Xr24h-tab (Isosorbide mononitrate) .Marland Kitchen... Take one tablet by mouth two times a day    Ramipril 10 Mg Caps (Ramipril) .Marland Kitchen... Take one capsule by mouth 2 times per day    Bisoprolol-hydrochlorothiazide 5-6.25 Mg Tabs (Bisoprolol-hydrochlorothiazide) ..... Once daily  Problem # 2:  CARDIOMYOPATHY, ISCHEMIC (ICD-414.8) Stable. If cough persists, will need to change Ace-inhibitor to ARB.   His updated medication list for this problem includes:    Aspirin 81 Mg Tbec (Aspirin) .Marland Kitchen... Take one tablet by mouth daily    Isosorbide Mononitrate Cr 30 Mg Xr24h-tab (Isosorbide mononitrate) .Marland Kitchen... Take one tablet by mouth two times a day    Ramipril 10 Mg Caps (Ramipril) .Marland Kitchen... Take one  capsule by mouth 2 times per day    Aldactone 25 Mg Tabs (Spironolactone) .Marland Kitchen... Take 1/2 tablet daily    Bisoprolol-hydrochlorothiazide 5-6.25 Mg Tabs (Bisoprolol-hydrochlorothiazide) ..... Once daily    Furosemide 40 Mg Tabs (Furosemide) .Marland Kitchen... Take one tablet by mouth daily.  Problem # 3:  SYSTOLIC HEART FAILURE, CHRONIC (ICD-428.22) Volume ok.  No change in therapy.   His updated medication list for this problem includes:    Aspirin 81 Mg Tbec (Aspirin) .Marland Kitchen... Take one tablet by mouth daily    Isosorbide Mononitrate Cr 30 Mg Xr24h-tab (Isosorbide mononitrate) .Marland Kitchen... Take one tablet by mouth two times a day    Ramipril 10 Mg Caps (Ramipril) .Marland Kitchen... Take one capsule by mouth 2 times per day    Aldactone 25 Mg Tabs (Spironolactone) .Marland Kitchen... Take 1/2 tablet daily    Bisoprolol-hydrochlorothiazide 5-6.25 Mg Tabs (Bisoprolol-hydrochlorothiazide) ..... Once daily    Furosemide 40 Mg Tabs (Furosemide) .Marland Kitchen... Take one tablet by mouth daily.  Problem # 4:  ICD - IN SITU-ST JUDE PROMOTE (ICD-V45.02) Followed Dr. Graciela Husbands.   Patient Instructions: 1)  Your physician recommends that you schedule a follow-up appointment in: 6 months 2)  Your physician recommends that you continue on your current medications as directed. Please refer to the Current Medication list given to you today.

## 2010-04-26 NOTE — Progress Notes (Signed)
Summary: question re med  Phone Note Call from Patient   Caller: Patient [phone] Reason for Call: Talk to Nurse Summary of Call: pt has question re med Initial call taken by: Glynda Jaeger,  March 06, 2010 4:22 PM  Follow-up for Phone Call        isosorbide was on backorder at [facility] and pt thought that he just couldn't get it anymore - per pharmacy - the RX should be in tomorrow and they will call the pt to let him know when it is ready.  Pt states understanding. Follow-up by: Charolotte Capuchin, RN,  March 06, 2010 5:00 PM

## 2010-05-14 ENCOUNTER — Encounter (INDEPENDENT_AMBULATORY_CARE_PROVIDER_SITE_OTHER): Payer: Self-pay | Admitting: *Deleted

## 2010-05-22 NOTE — Cardiovascular Report (Signed)
Summary: Office Visit Remote   Office Visit Remote   Imported By: Roderic Ovens 05/18/2010 10:57:45  _____________________________________________________________________  External Attachment:    Type:   Image     Comment:   External Document

## 2010-05-22 NOTE — Letter (Signed)
Summary: Remote Device Check  [facility], Main Office  [address]   [place], [place] [address]   Phone: [phone]  Fax: [phone]     May 14, 2010 MRN: [id number]   [facility] [address] [place], [place]  [address]   Dear Mr. FAVILA,   Your remote transmission was recieved and reviewed by your physician.  All diagnostics were within normal limits for you.  __X____Your next office visit is scheduled for:  March 2012 with Dr Graciela Husbands. Please call our office to schedule an appointment.    Sincerely,  Vella Kohler

## 2010-06-09 LAB — POCT I-STAT 3, VENOUS BLOOD GAS (G3P V)
Acid-base deficit: 4 mmol/L — ABNORMAL HIGH (ref 0.0–2.0)
pCO2, Ven: 45 mmHg (ref 45.0–50.0)
pH, Ven: 7.322 — ABNORMAL HIGH (ref 7.250–7.300)
pH, Ven: 7.347 — ABNORMAL HIGH (ref 7.250–7.300)

## 2010-06-09 LAB — POCT I-STAT 3, ART BLOOD GAS (G3+)
Acid-base deficit: 2 mmol/L (ref 0.0–2.0)
Bicarbonate: 22.4 mEq/L (ref 20.0–24.0)
pCO2 arterial: 37.7 mmHg (ref 35.0–45.0)
pH, Arterial: 7.383 (ref 7.350–7.450)
pO2, Arterial: 74 mmHg — ABNORMAL LOW (ref 80.0–100.0)

## 2010-06-13 LAB — BASIC METABOLIC PANEL
BUN: 12 mg/dL (ref 6–23)
Chloride: 99 mEq/L (ref 96–112)
GFR calc non Af Amer: >60 mL/min (ref >60)
Glucose, Bld: 90 mg/dL (ref 70–99)
Potassium: 4.6 mEq/L (ref 3.5–5.1)
Sodium: 135 mEq/L (ref 135–145)

## 2010-07-10 ENCOUNTER — Ambulatory Visit (INDEPENDENT_AMBULATORY_CARE_PROVIDER_SITE_OTHER): Payer: Medicare Other | Admitting: Internal Medicine

## 2010-07-10 ENCOUNTER — Encounter: Payer: Self-pay | Admitting: Internal Medicine

## 2010-07-10 DIAGNOSIS — I5022 Chronic systolic (congestive) heart failure: Secondary | ICD-10-CM

## 2010-07-10 DIAGNOSIS — Z95810 Presence of automatic (implantable) cardiac defibrillator: Secondary | ICD-10-CM

## 2010-07-10 DIAGNOSIS — N529 Male erectile dysfunction, unspecified: Secondary | ICD-10-CM

## 2010-07-10 DIAGNOSIS — K08109 Complete loss of teeth, unspecified cause, unspecified class: Secondary | ICD-10-CM

## 2010-07-10 DIAGNOSIS — I2589 Other forms of chronic ischemic heart disease: Secondary | ICD-10-CM

## 2010-07-10 DIAGNOSIS — I472 Ventricular tachycardia: Secondary | ICD-10-CM

## 2010-07-10 NOTE — Assessment & Plan Note (Signed)
The patient's device was interrogated.  The information was reviewed. No changes were made in the programming.    

## 2010-07-10 NOTE — Assessment & Plan Note (Signed)
Stable on current medications 

## 2010-07-10 NOTE — Progress Notes (Signed)
  HPI  [name] [name] is a 77 y.o. male seen in followup for Ischemic cardiomyopathy congestive heart failure for which he is s/p CRT-D implantation.  He is s/p CABG and most recent EF was about 30-35% in fall of 2009.     He has low R waves ranging from 3.7-4.4 most recently; because of this he underwent repeat DFT testing to assess for adequate sensing. This was normal.  He says his breathing is much better since his AV optimization echo; he is hoping that there is greater benefit to be gained. He's not having chest pain or shortness of breath  He was remarried in the fall.He is worried about sexual funciton   He takes isordil  No past medical history on file.  No past surgical history on file.  Current Outpatient Prescriptions  Medication Sig Dispense Refill  . aspirin 81 MG tablet Take 81 mg by mouth daily.        . bisoprolol (ZEBETA) 5 MG tablet Take 5 mg by mouth daily.        . furosemide (LASIX) 40 MG tablet Take 40 mg by mouth daily.        . isosorbide dinitrate (ISORDIL) 40 MG tablet Take 40 mg by mouth Nightly. 2 tablets       . Multiple Vitamins-Minerals (MULTIVITAMIN WITH MINERALS) tablet Take 1 tablet by mouth daily.        . ramipril (ALTACE) 10 MG capsule Take 10 mg by mouth 2 (two) times daily.        . simvastatin (ZOCOR) 40 MG tablet Take 40 mg by mouth at bedtime.        Marland Kitchen spironolactone (ALDACTONE) 25 MG tablet Take 25 mg by mouth daily. 1/2 tablet           Allergies not on file  Review of Systems negative except from HPI and PMH  Physical Exam Well developed and well nourished in no acute distress HENT normal E scleral and icterus clear Neck Supple JVP flat; carotids brisk and full Clear to ausculation Device pocket welllhealed Regular rate and rhythm, no murmurs gallops or rub Soft with active bowel sounds No clubbing cyanosis and edema Alert and oriented, grossly normal motor and sensory function Skin Warm and Dry     Assessment and  Plan

## 2010-07-10 NOTE — Assessment & Plan Note (Signed)
Stable on current medications. We will plan to have him hold his Isordil for a couple weeks before he sees Dr. Colon Branch to see whether he may be a candidate for Viagra

## 2010-07-10 NOTE — Patient Instructions (Addendum)
Your physician recommends that you schedule a follow-up appointment in: YEAR WITH DR Graciela Husbands Your physician recommends that you continue on your current medications as directed. Please refer to the Current Medication list given to you today. IN July  HOLD ISOSORBIDE  2 WEEKS PRIOR TO SEEING DR MACALHANY  TO SEE IF MAYBE A CANDIDATE  OR VIAGRA.

## 2010-07-10 NOTE — Assessment & Plan Note (Signed)
Will have him hold is nitroglycerin for a few week sprior to his visit with Dr Esmond Camper May be he will be a candidate for viagra

## 2010-08-07 NOTE — Assessment & Plan Note (Signed)
[facility]                            CARDIOLOGY OFFICE NOTE   NAME:[name] [name], [name] [name]                        MRN:          [id number]  DATE:04/11/2008                            DOB:          March 10, 1931    PRIMARY CARE PHYSICIAN:  Molly Maduro L. Foy Guadalajara, MD   CLINICAL HISTORY:  [name] is 77 years old and return for followup  management of his coronary heart disease and congestive heart failure.  He had bypass surgery in 1989 and documented occlusion of the vein graft  to the diagonal branch in 2001.  He also has an ejection fraction of 30%  and had increasing symptoms of shortness of breath.  He also had left  bundle-branch block on his ECG.  We referred him to Dr. Graciela Husbands for an  upgrade on his dual-chamber ICD biventricular pacer and this was done in  November.  He says he has done well since that time, and he feels like  his shortness of breath has improved.   He feels like he is having some side effects to metoprolol and he read  the side effects sheet and he is having drowsiness, headache, and thinks  that these maybe related to the metoprolol.   His current medication include:  1. Aspirin.  2. Vytorin 10/40.  3. Isosorbide 30 b.i.d.  4. Ramipril 10 b.i.d.  5. Metoprolol 50 mg one and a half tablet daily.   On examination today, the blood pressure was 125/65 and the pulse 68 and  regular.  There was no venous distension.  The carotid pulses were full  without bruits.  Chest was clear.  Cardiac rhythm was regular.  I could  hear no murmurs or gallops.  The abdomen was soft with normal bowel  sounds.  There was no prior splenomegaly.  Peripheral pulses were full  with no peripheral edema.   IMPRESSION:  1. Coronary artery disease status post coronary bypass graft surgery      in 1989 with documented occlusion of the vein graft to the diagonal      branch of the left anterior descending in 2001.  2. Ischemic cardiomyopathy, ejection fraction of  30%.  3. Class III systolic congestive heart, improved to class II,      euvolemic.  4. Status post dual-chamber implantable cardioverter-defibrillator      with recent upgrade to a biventricular pacer/implantable      cardioverter-defibrillator.  5. Hyperlipidemia.  6. Hypertension.  7. Chronic obstructive pulmonary disease.   RECOMMENDATIONS:  I think [name] is doing well at this point.  He is  having some side effects which may be related to metoprolol, and we will  plan to stop this and put him back on carvedilol.  We stopped the  carvedilol because he thought maybe there was some adverse effects of  this, but in retrospect I do not think they were related to carvedilol.  We will plan to see him in about 4 month, and we will do an  echocardiogram prior to that visit.  We will also have him come back for  fasting lipid, liver, CBC, and BNP.     Bruce Elvera Lennox Juanda Chance, MD, [facility]  Electronically Signed    BRB/MedQ  DD: 04/11/2008  DT: 04/12/2008  Job #: [id number]

## 2010-08-07 NOTE — Assessment & Plan Note (Signed)
[facility]                         ELECTROPHYSIOLOGY OFFICE NOTE   NAME:[name], [name] CARAWAY                        MRN:          [id number]  DATE:03/07/2008                            DOB:          [birth year]    [name] [name] is seen following CRTD implantation for congestive heart  failure.  He moved from class III to class II.  He is now walking and  going through most of the activities of daily living without significant  shortness of breath.  Right now, he is limited more by the cold than  anything else.   BMET obtained the late last week demonstrated normal renal function.  His medications were unchanged.   On examination, his blood pressure today was elevated at 160/70, his  pulse was 70.  His lungs were clear.  Heart sounds were regular.  The  extremities were without edema.   I do not know whether his increased blood pressure is a manifestation of  improved cardiac contractility, but when he comes back to see Dr. Juanda Chance  next month if he remains elevated further up-titration of his  antihypertensives will be in order.   We will see him again in February 2010 for reprogramming.     [name] Salvia, MD, [facility]  Electronically Signed    SCK/MedQ  DD: 03/07/2008  DT: 03/08/2008  Job #: [id number]

## 2010-08-07 NOTE — Assessment & Plan Note (Signed)
[facility]                            CARDIOLOGY OFFICE NOTE   NAME:[name] [name], [name] [name]                        MRN:          [id number]  DATE:01/06/2008                            DOB:          1930/08/15    PRIMARY CARE PHYSICIAN:  [name] Maduro L. Foy Guadalajara, MD   CLINICAL HISTORY:  [name] is 77 years old returned for management of  his congestive heart failure.  He had bypass surgery in 1989 and has  documented occlusion of the vein graft to the diagonal branch and LAD in  2001.  His ejection fraction was 30% and he has DDD ICD in place.  He  has symptoms of shortness of breath with exertion which is probably  class II CHF.  When I saw him last time, we adjusted his medicines by  increasing his Altace from 10 in the morning and 5 in the afternoon to  10 twice a day and we switched him from metoprolol to carvedilol 12.5  b.i.d.  As we made this change, he developed some pain in this leg and  shoulders which temporarily was related to the new medications.  His  shortness of breath was about the same.   PAST MEDICAL HISTORY:  Significant for COPD, hyperlipidemia and  hypertension.   CURRENT MEDICATIONS:  1. Aspirin.  2. Vytorin.  3. Isosorbide 30 b.i.d.  4. Carvedilol 12.5 b.i.d.  5. Ramipril 10 mg b.i.d..   PHYSICAL EXAMINATION:  VITAL SIGNS:  The blood pressure was 137/71, the  pulse 60 and regular.  NECK:  There was no venous tension.  The carotid pulses were full  without bruits.  CHEST:  Clear.  CARDIAC:  Rhythm was regular.  He had no murmurs or gallops.  ABDOMEN:  Soft with normal bowel sounds.  EXTREMITIES:  Peripheral pulses were full with no peripheral edema.   IMPRESSION:  1. Coronary artery disease status post coronary artery bypass graft      surgery in 1989 with documented occlusion of the vein graft to the      diagonal branch in 2001.  2. Ischemic cardiomyopathy, ejection fraction of 30%.  3. Class II to III systolic heart failure,  euvolemic.  4. Status post DDD ICD implantation.  5. Right leg pain, temporary related to changing from metoprolol to      carvedilol, hyperlipidemia, hypertension and COPD.   RECOMMENDATIONS:  [name] has some pain in his right leg and shoulders  which he relates to change in his medicines.  It seems an unlikely side  effect from the medicine, but in view of his temporal relationship, we  will plan to switch him back to metoprolol, but we will increase his  dose slightly to 50 in the morning and 25 in the afternoon.  I will  leave his ramipril the same.  He has class II to III heart failure with  left bundle-branch block on his ECG and I think he might benefit from a  Biv pacer.  Dr. Graciela Husbands did his initial implant.  We will arrange for him  to see him in consultation.  We will give him a flu shot today.  I will  plan to see him back in 4 months.     Bruce Elvera Lennox Juanda Chance, MD, [facility]  Electronically Signed    BRB/MedQ  DD: 01/06/2008  DT: 01/07/2008  Job #: [id number]

## 2010-08-07 NOTE — Assessment & Plan Note (Signed)
[facility]                         ELECTROPHYSIOLOGY OFFICE NOTE   NAME:[name] [name], [name] [name]                        MRN:          [id number]  DATE:05/24/2008                            DOB:          [birth year]    [name] [name] is seen in followup for CRT-D implantation for congestive  heart failure.  He is doing pretty well at this point with improved  functional status.  He denies any chest pain or lightheadedness at this  time.   His current medications include carvedilol 12.5 b.i.d. replacing  metoprolol, Ramipril 10 b.i.d., aspirin, isosorbide, Zocor, and  simvastatin.   On examination, his blood pressure was elevated at 170/82, his pulse was  76.  I should note that the blood pressure was quite anomalous and  elevated.  His lungs were clear.  Heart sounds were regular.  Extremities were without edema.   Interrogation of his [employer] Jude promote ICD demonstrated P-wave of 2.9.  I  did not measure the R-waves that they were previously 7.8, thresholds  were 0.75 at 0.5 in all chambers.  Heart rate excursion was adequate.  AV optimization was done again and we changed the things by 10  milliseconds.   IMPRESSION:  1. Congestive heart failure - chronic - systolic.  2. Ischemic heart disease.  3. Status post CRT-D for the above.   [name] [name] is doing quite well.  He will be followed by Dr. Juanda Chance.  We  will see him again in 6 months' time.  The other thing that we had to do  was we had to talk to [facility]. Jude because his Merlin machine was apparently  malfunctioning beeping everyday.  They advised that he try to refit it  by unplugging it and if he continues to have problems, he is to call us  and they will send him a new device.     Duke Salvia, MD, [facility]  Electronically Signed    SCK/MedQ  DD: 05/24/2008  DT: 05/25/2008  Job #: [id number]

## 2010-08-07 NOTE — Assessment & Plan Note (Signed)
[facility]                         ELECTROPHYSIOLOGY OFFICE NOTE   NAME:[name] [name], [name] [name]                        MRN:          [id number]  DATE:09/01/2007                            DOB:          1931-02-16    Mr. Rollinson is seen in followup for his ICD implanted for primary  prevention as part of the MADIT-II trial.  He has had no complaints of  chest pain or shortness of breath.  He saw Dr. Juanda Chance about 8 months  ago, and at that time, he was doing quite well.   MEDICATIONS:  Today include Altace 10, Imdur 60 at bedtime, metoprolol  50, and Vytorin.   PHYSICAL EXAMINATION:  Unfortunately, his blood pressure was mildly  elevated at 155/75.  When he saw Dr. Juanda Chance last, it was 142/70.  His  pulse was 68.  LUNGS:  Clear.  Neck veins were flat.  HEART:  Sounds were regular with a 2/6 murmur.  EXTREMITIES:  No edema.   Interrogation of his Guidant Vitality T125 device demonstrates a P-wave  of 3.4 with impedance of 662, threshold 0.6 and 0.5.  The R-wave was 11  with impedance of 655, threshold 1.4 and 0.5.  Battery voltage 2.59.  There was 1 episode of pace-terminated ventricular tachycardia; this is  his first appropriate therapy.  He had another episode of nonsustained  VT.   IMPRESSION:  1. Ischemic heart disease with remote bypass surgery.  2. Ejection fraction of 30%.  3. Status post implantable cardioverter-defibrillator as part of MADIT-      II trial.  4. Ventricular tachycardia with appropriate antitachycardia pacing      therapy.  5. Chronic obstructive pulmonary disease.  6. Hypertension.   Mr. Bernard is doing well.  There has been a little bit of activity of  nonsustained VT, the one episode of sustained VT is as noted.  We will  continue to monitor via Latitude.  It may be that there is a change in  substrate, which will require further intervention.   I am also bothered by his blood pressure.  We will need to keep a close  eye on  this.  I have asked him when he follows up with Dr. Foy Guadalajara, that  further antihypertensive therapy might be appropriate.   We will see him again in 1 year's time.  He will be followed by [facility]  in the interim.     Duke Salvia, MD, [facility]  Electronically Signed    SCK/MedQ  DD: 09/01/2007  DT: 09/02/2007  Job #: [id number]   cc:   Molly Maduro L. Foy Guadalajara, M.D.

## 2010-08-07 NOTE — Assessment & Plan Note (Signed)
[facility]                            CARDIOLOGY OFFICE NOTE   NAME:[name] [name], [name] [name]                        MRN:          [id number]  DATE:12/26/2006                            DOB:          08-26-1930    PRIMARY CARE PHYSICIAN:  Dr. Molly Maduro L. Foy Guadalajara.   CLINICAL HISTORY:  Mr. Meter is 77 years old and returns for management  of his coronary heart disease.  He had coronary bypass surgery 19 years  ago in 1989, and has documented occlusion of a vein graft to a diagonal  branch of the left anterior descending in 2001.  He has an ejection  fraction of 28% by last Myoview scan which was done last year, and which  showed no evidence of ischemia.  He has a DDD pacemaker with an ICD in  place that is programmed to DDI mode.   He says he has been doing quite well with his heart.  He has had no  chest pain, shortness of breath, or palpitations.   PAST MEDICAL HISTORY:  1. Chronic obstructive pulmonary disease.  2. Hyperlipidemia.  3. Hypertension.   REVIEW OF SYSTEMS:  Positive for some erectile dysfunction.   CURRENT MEDICATIONS:  1. Metoprolol.  2. Imdur.  3. Vytorin.  4. Simvastatin.  5. Altace.   PHYSICAL EXAMINATION:  Blood pressure is 142/70 and the pulse is 54 and  regular.  There was no venous distension.  Carotid pulses were full without  bruits.  Chest was clear.  The cardiac rhythm was regular.  Heart sounds were normal, I could hear  no murmurs or gallops.  Abdomen was soft with normal bowel sounds.  There is no  hepatosplenomegaly.  Pedal pulses were equal and there is no peripheral edema.   An electrocardiogram today showed atrial pacing and left bundle branch  block.   IMPRESSION:  1. Coronary artery disease, status post coronary bypass graft surgery      in 1989 with documented occlusion of the vein graft to the diagonal      branch of left anterior descending in 2001, now stable.  2. Ischemic cardiomyopathy with an ejection  fraction of 28% by Myoview      scan.  3. Status post DDD pacer/ICD implantation.  4. Hyperlipidemia.  5. Hypertension.  6. Chronic obstructive pulmonary disease.   RECOMMENDATIONS:  I think Mr. Much is doing well.  He is having some  erectile dysfunction.  We gave him a prescription for Viagra 25 mg  daily.  I told him that he cannot take nitroglycerin within 24 hours of  the Viagra.  Will have him come back for a fasting lipid, liver, CBC,  BNP, and TSH.  I will plan to see him back in a year.  He has been  followed on the [employer] remote transmission.  He will  be due for thresholds in 6 months.     Bruce Elvera Lennox Juanda Chance, MD, [facility]  Electronically Signed    BRB/MedQ  DD: 12/26/2006  DT: 12/26/2006  Job #: [id number]

## 2010-08-07 NOTE — Letter (Signed)
February 05, 2008    Bruce R. Juanda Chance, MD, [facility]  [address]  [place], [place] [address]   RE:  DIXIE, JAFRI  MRN:  [id number]  /  DOB:  [birth year]   Dear Smitty Cords:   It was a pleasure seeing Marq Jester today in request for consideration  of CRT upgrade.   As you know, he has a history of ischemic heart disease with prior  bypass surgery in 1989 and documented occlusion of his vein graft to  diagonal and his LAD in 2001.  His ejection fraction is 30% and last  assessment of his coronaries are as above and his last Myoview scan was  in 2003 with mostly evidence of scar.   Over the last number of months, he has had progressive shortness of  breath.  He notes this when trying to climb inclines.  He is able to  climb 8 stairs without too much shortness of breath.  He tried to change  the tire on his truck in the last couple of days and could not do so  without significant shortness of breath.  This has been true,  notwithstanding your attempts to maximize his medical regime.  He is  having some peripheral edema, but no nocturnal dyspnea.  He has had no  palpitations.   PAST MEDICAL HISTORY:  Notable for COPD, hypertension, and  hyperlipidemia.   CURRENT MEDICATIONS:  1. Aspirin 81.  2. Vytorin 10/40.  3. Isosorbide 30 b.i.d.  4. Ramipril 10 b.i.d.  5. Metoprolol 75 daily.   ALLERGIES:  He has no known drug allergies.  He has had problems with  CRESTOR in the past.   PHYSICAL EXAMINATION:  GENERAL:  He is in no acute distress.  VITAL SIGNS:  His blood pressure 150/80.  His pulse was 69.  His weight  was 154.  HEENT:  Demonstrated no icterus nor xanthoma.  His neck veins were 7-8  cm.  LUNGS:  Clear.  BACK:  Without kyphosis or scoliosis.  CARDIAC:  His heart sounds were regular with an S4 and there is a 2/6  murmur heard at the left lower sternal border.  ABDOMEN:  Soft with active bowel sounds without midline pulsation or  hepatojugular reflux or hepatomegaly.   Femoral pulses were 2+, distal  pulses were intact.  There is no clubbing, cyanosis, or edema.  SKIN:  Warm and dry.   Electrocardiogram from your visit on January 06, 2008 demonstrated sinus  rhythm at 55 with intervals of 0.13/0.16/0.46.  Axis was leftward -30  degrees.   He has a previously implanted Guidant VITALITY ICD with a dual-chamber  device and implantation originally accomplished in 2001 with a Medtronic  lead in the atrium and a Guidant ventricular lead.  There was an issue  of recall for the Guidant Prizm 1861 pulse generator and this was  changed out for the VITALITY T125 in September.   IMPRESSION:  1. Ischemic cardiomyopathy.      a.     Prior bypass.      b.     Ejection fraction of 30%.  2. Congestive heart failure - systolic - acute on chronic now, class      IIIA.  3. Status post ICD as part of the MADIT II trial with a 0148 guidant      lead.  History of ventricular tachycardia with appropriate ICD      therapy.  4. Left bundle-branch block, left axis deviation.   Smitty Cords,  Mr. Choquette has congestive heart failure that may well be amendable  to resynchronization therapy with his broad left bundle and left axis  deviation.  He does have some venous distention on his anterior chest  and so venogram prior to generator replacement would be appropriate.  I  think, the likelihood that we will be able an deploy an LV lead is in  the range of 90-95% and I  have reviewed the potential benefits as well as potential risks with Mr.  Cho and he is agreeable to proceed.   Thanks very much for asking [place] to do this and we will keep you abreast  of how things go.    Sincerely,      Duke Salvia, MD, [facility]  Electronically Signed    SCK/MedQ  DD: 02/05/2008  DT: 02/06/2008  Job #: [id number]

## 2010-08-07 NOTE — Op Note (Signed)
[name] [name], [name] [name]                 ACCOUNT NO.:  [id number]   MEDICAL RECORD NO.:  [id number]          PATIENT TYPE:  INP   LOCATION:  6524                         FACILITY:  [facility]   PHYSICIAN:  Duke Salvia, MD, FACCDATE OF BIRTH:  [birth year]   DATE OF PROCEDURE:  02/11/2008  DATE OF DISCHARGE:                               OPERATIVE REPORT   PREOPERATIVE DIAGNOSIS:  Congestive heart failure, previously implanted  implantable cardioverter-defibrillator.   POSTOPERATIVE DIAGNOSIS:  Congestive heart failure, previously implanted  implantable cardioverter-defibrillator; failed RV-pace sense lead.   PROCEDURE:  Contrast venography; insertion of a left ventricular lead;  explantation of a previously implanted defibrillator; pocket revision;  insertion of a device; high-voltage testing; and insertion of a right  ventricular pacing lead.   Following obtaining informed consent, the patient was brought to the  electrophysiology laboratory and placed on the fluoroscopic table in a  supine position.  After routine prep and drape, contrast was inserted  via left antecubital vein that identified the course of the patency of  the extrathoracic left subclavian vein.  This having been accomplished,  lidocaine was infiltrated along the line of the previous incision and  was carried down to layer of the prepectoral fascia using electrocautery  and sharp dissection.  The pocket was not opened.  Venous access was  obtained.  The vein was dilated.  A 9.5-French sheath was placed which  was passed Medtronic MB-2 coronary sinus cannulation catheter which  allowed for ready cannulation of the coronary sinus.  Contrast  venography demonstrated a mid lateral branch which we were then able to  access with a Whisper wire without much difficulty.   This began a series of difficult events.  Initially, an 1156 lead was  tried from [employer].  In the distal half of the vein, diaphragmatic  stimulation was  seen.  In the proximal half of the vein using the  proximal ring, we could pace the left ventricle without diaphragmatic  stimulation.   At this point, we thought that we would be okay and I opened up the  defibrillator pocket, explanted the device, and then did LV ring RV coil  pacing to confirm the above.  We also had to revise the pocket at this  juncture because the shapes and the size of new device were considerably  different as well as bigger than the explanted Vitality device.  This  required excavation of the scarred in leads along the inferior margin of  the pocket and then extension of the pocket caudally.  This was  complicated by some bleeding which were able to successfully control.   At this juncture, it was decided that we could not be sure about the  security of the 1156 lead in this position and so we tried first a  Medtronic 4194 lead.  It had a very large proximal electrode and  unfortunately, it too had diaphragmatic stimulation across even greater  excursion of this vein than had the 1156.  We then tried to deploy a  4195 StarFix lead; however, I was unable to pass the  junction of the  vein and the body of the coronary sinus.   Review of initial venogram identified a high anterolateral branch and it  was elected to pursue that.  We used an Attain 90-degree access catheter  to cannulate this with a wire.  However, having placed a wire into, we  were unable to pass the 1156.  We then tried an Attain 2 dilator system,  we was unable to pass the dilator much less the sheath past this  junction and we abandoned this site.  I then used the Attain 1 Select  sheath to recannulate the lateral branch.  I placed the 1156 lead back  into this lateral branch and left it as remote as distally located as we  could without diaphragmatic stimulation and then in this location the  bipolar pacing threshold of 1 volt at 0.5, the R-wave was 9.2 with an  impedance of 411, and current  threshold 2.7 MA and there was no  diaphragmatic pacing at 10 volts.  This lead was secured to the  prepectoral fascia.  We then went to attach the leads to a Promote ICD.  However, on attachment, we realized that there was no signal coming from  the RV sense lead and then we took the lead out it was clear that the  sealing ring had come off from the lead itself and the lead was not able  to be redeployed into the initially chosen ICD.  We then re-accessed the  subclavian vein with a 7-French sheath, passed a St. Jude 1688 TC, 58-cm  lead, serial [id number] and this was passed through right ventricular  septum where the bipolar L-wave was 24 mV with a pace impedance of 700  ohms, threshold of 1.3 volts at 0.5 milliseconds, and current threshold  2.1 mA.   This lead was secured to the prepectoral fascia and then the leads were  attached to a St. Jude Promote plus [id number] ICD, serial [id number].  Through  the device, the bipolar P-wave was 3.7 with a pace impedance of 390,  threshold 0.7 at 0.5, the R-wave was 4.1 with an impedance of 610, and a  threshold 0.7 at 0.5.  I should note that the current of injury for this  RV lead deployment was very good.  The LV impedance was 410 with a  threshold of 1 volt at 0.5.  High-voltage impedance was 50 ohms.  The  pocket was copiously irrigated with antibiotic containing saline  solution and flushed with nonheparinized saline given the duration of  the procedure.  The leads and pulse generator were placed in the pocket  with the device placed in a transverse orientation because despite my  best efforts to extend the packet caudally, I was not able to do it  sufficiently to keep the pocket attachments loose and we then closed the  wound in 3 layers in normal fashion.  I should note that we put Surgicel  on the back side of the device as well as the medial and cephalad  grooves of the device.  The wound was closed in 3 layers as noted.  The  wound was  washed and dried and a benzoin and Steri-Strip dressing was  applied.  This was followed by a pressure dressing.   The patient tolerated all of the above with amazing aplomb, without  evidence of complications.      Duke Salvia, MD, [facility]  Electronically Signed     SCK/MEDQ  D:  02/11/2008  T:  02/12/2008  Job:  [id number]

## 2010-08-07 NOTE — Discharge Summary (Signed)
NAMEJORDIE, [name] [name]                 ACCOUNT NO.:  [id number]   MEDICAL RECORD NO.:  [id number]          PATIENT TYPE:  INP   LOCATION:  6524                         FACILITY:  [facility]   PHYSICIAN:  Duke Salvia, MD, FACCDATE OF BIRTH:  March 01, 1931   DATE OF ADMISSION:  02/11/2008  DATE OF DISCHARGE:  02/12/2008                               DISCHARGE SUMMARY   FINAL DIAGNOSES:  1. Discharging day #1 status post biventricular implantable      cardioverter-defibrillator upgrade.  2. The patient has a Guidant Vitality DS DDD implantable cardioverter-      defibrillator replaced by St. Jude PROMOTE cardiac      resynchronization therapy defibrillator upgrade, Dr. Sherryl Manges.  3. Replacement of failed right ventricular lead as well.  4. Contrast venogram this admission.   SECONDARY DIAGNOSES:  1. History of myocardial infarctions/coronary artery disease/coronary      artery bypass graft surgery in 1989.  2. History of ventricular tachycardia with implant of Guidant Vitality      dual-chamber cardioverter defibrillator in 2001.  3. Ejection fraction of 30%.  4. New York Heart Association class IIIA chronic systolic congestive      heart failure.  5. Left bundle-branch block.  6. Ejection fraction rebounding despite maximum medical therapy.  7. Chronic obstructive pulmonary disease.  8. Hypertension.  9. Dyslipidemia.   BRIEF HISTORY:  [name] [name] is a 77 year old male who is referred by Dr.  Charlies Constable to Dr. Graciela Husbands requesting CRT upgrade   The patient has a history of ischemic heart disease.  He is status post  coronary artery bypass in 1989.  He had repeat catheterization in 2001.  This showed occlusion of the saphenous vein graft to the diagonal.  His  ejection fraction is 30%.  He had a Myoview study in 2003 with evidence  of scar.   The patient is presenting with a progressive shortness of breath.  This  is apparent when he tries to climb inclines.  He can climb 8 stairs  without getting too short of breath.  Recently, he tried to change the  tire on his truck, he was unable to do so without getting significantly  short of breath.  The patient has undergone attempts to maximize his  medical regimen, but his ejection fraction and his New York Heart  Association symptoms have not improved.  The patient is not experiencing  presyncope or syncope.  He is not experiencing palpitations.   [name] [name] congestive heart failure may be amendable to  resynchronization therapy.  He does have a left bundle-branch block with  left axis deviation.  Examination shows venous distention in the  anterior chest.  The procedure plan would include left venogram  generator change out, placement of a left ventricular cardiac  resynchronization lead.   HOSPITAL COURSE:  The patient presents electively on February 11, 2008.  He underwent successful generator change out with implant of the St.  Jude Promote CRT-D system by Dr. Graciela Husbands.  Interrogation of the device  showed that the right ventricular lead is impaired and this was  replaced.  He also received the cardiac resynchronization lead in proper  position.  Of note, the patients QRS prior to implant was shown to be  162 milliseconds.  After implant, the, QRS on electrocardiogram February 12, 2008, is 136 milliseconds.   Laboratory studies pertinent to this admission are drawn on February 10, 2008, white cells 9, hemoglobin 15.8, hematocrit 45.2, platelets of 140.  Protime is 11.3, INR is 0.9.   The patient is asked on discharge to keep his incision dry for the next  7 days and to sponge bathe until Thursday, February 18, 2008.   MEDICATIONS ON DISCHARGE:  1. Enteric-coated aspirin 81 mg daily.  2. Vytorin 10/40 daily at bedtime.  3. Isosorbide mononitrate 30 mg twice daily.  4. Ramipril 10 mg twice daily.  5. Metoprolol 50 mg tablets 1-1/2 tablets daily.   He follows up at [facility] [address]  [place].  1. ICD Clinic on Wednesday, March 02, 2008, at 9:40.  Blood work      will be taken.  2. He will see Dr. Graciela Husbands on Monday, March 07, 2008, at 4 o'clock.      Maple Mirza, [place]      Duke Salvia, MD, [facility]  Electronically Signed    GM/MEDQ  D:  02/12/2008  T:  02/12/2008  Job:  [id number]   cc:   Everardo Beals. Juanda Chance, MD, [facility]  Robert L. Foy Guadalajara, M.D.

## 2010-08-07 NOTE — Assessment & Plan Note (Signed)
[facility]                            CARDIOLOGY OFFICE NOTE   NAME:[name] [name], [name] [name]                        MRN:          [id number]  DATE:11/23/2007                            DOB:          Mar 23, 1931    PRIMARY CARE PHYSICIAN:  [name] [name] L. Foy Guadalajara, MD   CLINICAL HISTORY:  [name] is 77 years old and returns for followup  management of his coronary heart disease.  He had bypass surgery in 1989  and has documented occlusion of the vein graft to diagonal branch of the  LAD in 2001.  He has an ejection fraction of 30% and he has a dual-  chamber ICD in place.   He says he has been doing very well.  He does say that he feels like he  is slowing down some, he gives out more easily then used to.  He has had  no chest pain or palpitations.   PAST MEDICAL HISTORY:  Significant for COPD, hyperlipidemia, and  hypertension.   CURRENT MEDICATIONS:  1. Metoprolol 50 mg daily.  2. Imdur 30 mg 2 tablets daily.  3. Vytorin 10/40 daily.  4. Aspirin 81 mg daily.  5. Ramipril 5 mg 2 tablets in the morning, 1 tablet in the evening.   PHYSICAL EXAMINATION:  VITAL SIGNS:  Today, the blood pressure 142/78,  the pulse 67 and regular.  NECK:  There was no venous distension.  The carotid pulses were full  without bruits.  CHEST:  Clear.  CARDIAC:  Rhythm was regular.  I could hear no murmurs or gallops.  ABDOMEN:  Soft without organomegaly.  EXTREMITIES:  Peripheral pulses were full with no peripheral edema.   Electrocardiogram showed left bundle branch block overriding his  pacemaker with sinus rhythm.   IMPRESSION:  1. Coronary artery disease, status post coronary bypass graft surgery      in 1989 with documented occlusion of the vein graft to diagonal      branch of the left anterior descending in 2001.  2. Ischemic cardiomyopathy with ejection fraction of 30%.  3. Class II-III systolic congestive heart failure, euvolemic.  4. Status post dual-mode,  dual-pacing, dual-sensing pacer/implantable      cardioverter-defibrillator implantation.  5. Hyperlipidemia.  6. Hypertension.  7. Chronic obstructive pulmonary disease.   RECOMMENDATIONS:  Mr. Shieh says he has had some decreased exercise  tolerance recently.  He is probably not on optimal therapy for his  systolic heart failure.  We will plan to increase his Altace from 10 mg  in the morning and 5 mg in the evening to 10 mg b.i.d.  We will Also  switch him from metoprolol 50 mg daily to Coreg 12.5 b.i.d.  I will plan  to see him back in 6 weeks and we will consider titrating his Coreg up.  He has underlying rhythm is left bundle branch blocks so he may be a  candidate for an upgrade by IV pacer if he has persistent symptoms.     Bruce Elvera Lennox Juanda Chance, MD, [facility]  Electronically Signed    BRB/MedQ  DD: 11/23/2007  DT: 11/24/2007  Job #: [id number]

## 2010-08-10 NOTE — Op Note (Signed)
[place]  Patient:    [name] [name], [name] [name]                        MRN: [id number] Proc. Date: 11/15/99 Adm. Date:  [id number] Disc. Date: [id number] Attending:  Nathen May CC:         Electrophysiology Laboratory  [place] [facility] - Attention:  Delsa Grana   Operative Report  PROCEDURE:  Contrast venography and implantation of a dual-chamber defibrillator.  CARDIOLOGIST:  Nathen May, M.D.  PREOPERATIVE DIAGNOSIS:  MADIT-2 randomization to defibrillator implantation, with markedly abnormal antegrade atrioventricular nodal conduction characteristics.  POSTOPERATIVE DIAGNOSIS:  MADIT-2 randomization to defibrillator implantation, with markedly abnormal antegrade atrioventricular nodal conduction characteristics.  DESCRIPTION OF PROCEDURE:  Following the informed consent, the patient was prepped for a defibrillator implantation.  The procedure was carried out under general anesthesia under the care of Dr. Edwin Cap. Zoila Shutter.  An incision was made in the prepectoral subclavicular region and was carried down to the layer of the prepectoral fascia using electrocautery.  A pocket was formed using electrocautery.  Hemostasis was obtained.  Thereafter attention was turned to gaining access to the extrathoracic left subclavian vein which was accomplished without difficulty, and without the aspiration of air, with two separate venipunctures.  A #0 silk suture was placed in a figure-of-eight fashion and allowed to angle in the ______.  Thereafter, the sequential 9-French tearaway introducer sheaths were placed that allowed for the sequential passage of a Guidant model #0148, serial [id number], dual-coiled defibrillator lead, and a Medtronic #6940, 52.0 cm in length active fixation atrial lead, serial #[id number].  Under fluoroscopic guidance these were manipulated to the right ventricular apex and the remnant to the right atrial appendage  respectively.  Through the PSA, the bipolar R-wave was 13.8 mV with a pacing impedance of 840 ohms, a pacing threshold of 0.7 V, at 0.5 msec, with a current of 0.8 mV.  There is no diaphragmatic pacing at 10 V.  The bipolar P-wave is 3.3 mV, with impedance of 540 ohms, a pacing threshold of 0.9 V, at 0.5 msec, with a currented threshold of 1.5 mA.  With these acceptable parameters recorded, the leads were secured to the prepectoral fascia.  The hemostatic suture was secured (albeit subsequently removed).  The leads were then attached to a Guidant Prizm-II DR AICD model F3187630, serial [id number].  Through the device the bipolar R-wave was 19.5 mV, with a pacing impedance of 1105 ohms, and a pacing threshold at 0.5 msec, and 0.6 V.  The bipolar P-wave was 2 mV, with impedance of 586 ohms, and a pacing threshold of 0.8 V, at 0.5 msec.  It should be noted that initial numbers were much lower following the connection of the lead to the defibrillator.  This was associated with the lead having been inadvertently advanced excessively from its implant position.  This was corrected by release of the sutures, and withdrawal of the lead to the final position.  With these acceptable parameters recorded, defibrillation threshold testing was undertaken.  The ventricular fibrillation was induced via the T-wave shock.  After a total duration of 9.5 seconds an 11 joule shock was delivered through a measured resistance of 50 ohms, terminating ventricular fibrillation and restoring sinus rhythm.  After a wait of five to six minutes, ventricular fibrillation was reinduced. After a total duration of 8.5 seconds, an 11 joule shock was delivered through a measured resistance of 50 ohms, terminating ventricular  fibrillation and restoring a sinus/paced rhythm.  It should be noted that the first shock was undertaken at nominal sensitivity, and the second one was undertaken at the sensitivity.  With these acceptable  parameters recorded, the system was implanted.  The pocket was copiously irrigated with antibiotic-containing saline solution.  The pulse generator was then secured to the prepectoral fascia.  The wound was closed in three layers, in a normal fashion.  The wound was washed, dried, and a benzoin Steri-Strip dressing was applied.  The needle count, sponge count, and instrument count were correct at the end of the procedure according to the staff.  The patients device is programmed as a three-zone device at 160/185/210, with empiric antitachycardic pacing, programmed on end zones one and two. DD:  11/15/99 TD:  11/16/99 Job: [id number]

## 2010-08-10 NOTE — Discharge Summary (Signed)
[place]  Patient:    [name] [name], [name] [name]                        MRN: [id number] Adm. Date:  [id number] Disc. Date: 09/13/99 Attending:  Lenoria Farrier Dictator:   Lavella Hammock, P.A. CCEverardo Beals Juanda Chance, M.D. [facility]             Marinda Elk, M.D.                           Discharge Summary  DATE OF BIRTH:  1930/10/28  PROCEDURES: 1. Cardiac catheterization. 2. Coronary arteriogram. 3. Left ventriculogram.  HOSPITAL COURSE:  Mr. Sollenberger is a 77 year old male with a history of bypass surgery in 1989 who complained of abdominal and upper chest pain radiating to his shoulder blades for three days.  He stated his symptoms were worse at night.  He was admitted to rule out MI and for further evaluation.  His enzymes were negative for MI, and he had a cardiac catheterization on September 13, 1999.  It shoed a left main with distal 80% lesion, LAD that was occluded proximally and received distal circulation via the LIMA.  The LAD was distally diffused and totalled further down.  The first diagonal was occluded proximally and filled via collaterals.  The diagonal-2 filled via the LIMA to LAD.  The circumflex was occluded proximally, and the OM-1 and OM-2 filled via the SVG.  The RCA was occluded in mid portion, and the PDA filled via the SVG. There was a 70% RCA lesion after the PDA, but the posterolateral was filling via collateral from the circumflex.  The SVG to the first diagonal was occluded.  The SVG to OM-1 and OM-2 was patent.  The SVG to PDA was patent, and the LIMA to LAD was patent.  There was no MR, and his EF was 30%.  Medical management was recommended.  Post catheterization, his groin was stable, and he was ambulatory without any difficulty.  He had some leukocytosis with a white count of 17,000 on admission, but he was asymptomatic, and his urinalysis did not show any bacteria.  He was considered stable for discharge on September 13, 1999.  LABORATORY DATA:  Hemoglobin 15.8, hematocrit 43.7, WBC 17.4, platelets 193. Sodium 134, potassium 4.1, chloride 99, CO2 28, BUN 17, creatinine 1.1, glucose 105.  Serial CK-MB and troponin I negative for MI.  Urinalysis showed 15 ketones, 30 protein, 1.0 urobilinogen, negative for nitrites and leukocytes and had no bacteria.  There was 0 to 5 WBCs and RBCs per high powered field.  DISCHARGE CONDITION:  Stable.  CONSULTS:  None.  COMPLICATIONS:  None.  DISCHARGE DIAGNOSES: 1. Abdomen and chest pain.  Progression of coronary artery disease noted on    cardiac catheterization, medical therapy recommended. 2. Left ventricular dysfunction with an ejection fraction of 30% by    catheterization this admission. 3. Status post aortocoronary bypass surgery in September 1989 with left    internal mammary arty to left anterior descending artery, saphenous vein    graft to obtuse marginal #1 and obtuse marginal #2, saphenous vein graft to    diagonal, and saphenous vein graft to posterior descending artery. 4. Gastroesophageal reflux disease. 5. Hypertension. 6. Hyperlipidemia. 7. Asymptomatic leukocytosis.  DISCHARGE INSTRUCTIONS:  His activity level is to include no driving or strenuous  activity for two days.   He is to stick to a low-fat diet.  He is to call the office for bleeding, swelling, or drainage at the catheterization site.  He is to follow up with Dr. Juanda Chance on July 10 at 10:30 a.m.   He is to follow up with Dr. Foy Guadalajara as needed.  DISCHARGE MEDICATIONS: 1. Coated aspirin 325 mg q.d. 2. Lopressor 50 mg q.d. 3. Lipitor 20 mg q.d. 4. Altace 5 mg q.d. 5. Imdur 30 mg q.d. 6. Nitroglycerin 0.4 mg sublingual p.r.n. DD:  09/13/99 TD:  09/13/99 Job: [id number]

## 2010-08-10 NOTE — Cardiovascular Report (Signed)
[place]. [facility]  Patient:    [name] [name], [name] [name]                        MRN: [id number] Proc. Date: 09/13/99 Adm. Date:  [id number] Disc. Date: [id number] Attending:  Lenoria Farrier CC:         Veneda Melter, M.D. [facility]             Marinda Elk, M.D.             Bruce Elvera Lennox Juanda Chance, M.D. [facility]                        Cardiac Catheterization  PROCEDURES PERFORMED:         1. Left heart catheterization.                               2. Selective angiography, coronary artery                                  saphenous vein graft and internal mammary                                  bypass graft.                               3. Left ventriculogram.  DIAGNOSES:                    1. Native three-vessel coronary artery disease.                               2. Atherosclerosis, saphenous vein graft.                               3. Moderate left ventricular systolic                                  dysfunction.  HISTORY:                      Mr. Fitterer is a 77 year old white male with a known history of coronary artery disease who had undergone coronary artery bypass graft surgery in September of 1989 with placement of LIMA graft to LAD, vein graft to OM1 with jump to OM2, vein graft to the PDA.  The patient has had mild LV dysfunction by electrocardiogram in 1999 with EF of 40-45%.  He recently presents with complaints of upper abdominal discomfort with radiation to the back between the shoulder blades for approximately three days. Symptoms appear to be worse at night.  He has also noted some mild increase in dyspnea on exertion.  The patient was admitted to the hospital, subsequently ruled out for acute myocardial infarction.  He presents now for coronary angiography.  TECHNIQUE:                    After informed consent was obtained, patient was brought to the catheterization lab where both groins were sterilely prepped  and draped.  Then 1% lidocaine was used to  infiltrate the right groin.  A 6 French sheath was placed in the right femoral artery, using modified Seldinger technique.  The 6 [place] JL-4 and JR-4 catheters then used to engage the left and right coronary arteries.  A selective angiography was performed in various projections using manual injections of contrast.  The JR-4 catheter was then used to engage the saphenous vein bypass grafts as well as the internal mammary graft to the LAD. Again selective angiography was performed using manual injections of contrast.  A 6 French pigtail catheter was then advanced into the left ventricle and a left ventriculogram performed using power injections of contrast.  At the termination of the case, the catheters and sheath were removed and manual pressure applied until adequate hemostasis was achieved.  The patient tolerated the procedure well and was transferred to the floor in stable condition.  FINDINGS: 1. Left main trunk, medium caliber vessel, distal narrowing of 80-90%. 2. Left anterior descending is a medium caliber vessel.  It is occluded    in its proximal segment.  A trivial first diagonal branch appears to    arrive very early from the LAD and has severe narrowing of 90% at its    origin.  The mid and distal LAD fill via a LIMA graft.  The second diagonal    branch is a small caliber vessel.  It is occluded at its proximal segment.    It fills via retrograde collateral flow from the circumflex artery, the    tail end of the saphenous vein graft to the diagonal branch is seen with    some persistence of staining in the graft.  A third diagonal branch has    mild irregularities.  The distal LAD has severe diffuse disease with    sequential narrowings of 90 and 100% just prior to the apex. Collateral    flow is then noted from the distal LAD to the apical segment. 3. Left circumflex artery.  This appears to be a large caliber vessel.  It is    occluded in its proximal segment.  The AV  circumflex is occluded throughout    its course.  The first and second marginal branches fill via a saphenous    vein graft and have mild diffuse disease.  Collateral flow to a second    diagonal branch as well as to the posterior ventricular branch of the RCA    is noted. 4. Right coronary artery is dominant.  This is a medium caliber vessel that    provides the posterior descending artery as well as a posterior ventricular    branch at its terminal segment.  The right coronary artery has severe    diffuse disease in the midsection and is then occluded.  The posterior    descending artery as well as the saphenous vein graft has mild    irregularities.  There is a narrowing of 70% between the posterior    descending artery and posterior ventricular branch.  The posterior    ventricular fills via a flow from the PDA as well as collateral flow from    the left circumflex artery. 5. Saphenous vein graft to the diagonal branch of the LAD is occluded in its    proximal segment. 6. Saphenous vein graft to OM1 with jump segment to OM2 is patent with mild    irregularities. 7. Saphenous vein graft to the PDA of the right coronary artery  is patent with    mild irregularities. 8. LIMA graft to the LAD patent. 9. Left ventricular mildly dilated, end-systolic and end-diastolic dimensions.    Overall left ventricular function is moderately impaired. Ejection fraction    approximately 30%.  There is hypokinesis of the apex with akinesis and    aneurysmal dilatation of the basal inferior wall.  No mitral regurgitation    is noted.  LV pressure is 100/10, aortic was 100/50. LVEDP equals 15.  ASSESSMENT AND PLAN:          Mr. Rumbold is a 77 year old gentleman with progressive coronary artery disease and evidence of a recently occluded saphenous vein graft to the diagonal branch of the LAD.  This is a small caliber vessel that appears to fill adequately via the collaterals from the left circumflex  system.  Due to the length of the occlusion and the small caliber of the vessel, percutaneous intervention was unlikely to provide  sustained benefit, and thus further medical management will be pursued. DD:  09/13/99 TD:  09/15/99 Job: [id number]

## 2010-08-10 NOTE — Discharge Summary (Signed)
[place]. [facility]  Patient:    [name] [name], [name] [name]                          MRN: [id number] Adm. Date:  11/15/99 Disc. Date: 11/16/99 Attending:  Nathen May, M.D., [facility] Dictator:   Gene Serpe, P.A.                           Discharge Summary  PROCEDURE:  Electrophysiology study, November 15, 1999.  ACD/permanent pacemaker implantation, November 15, 1999.  REASON FOR ADMISSION:  Please refer to dictated admission noted.  HOSPITAL COURSE:  The patient underwent elective electrophysiology study, by Dr. Odessa Fleming, on day of admission.  Dr. Graciela Husbands found no evidence of inducable ventricular tachycardia.  However, there was marked prolongation of the AV nodal conduction pathway and mild HV prolongation.  Recommendation was to proceed with MEDIT II randomization:  The patient was randomized to the ACD on arm and underwent a successful implantation that same day.  Additionally, secondary to the impairment of the AV nodal pathway, Dr. Graciela Husbands also placed a dual chamber Medtronic pacemaker.  Postoperative course revealed synchronous AV pacing and stable wound site.  MEDICATION ADJUSTMENTS:  None this admission.  DISCHARGE MEDICATIONS: 1. Isosorbide 30 mg q.d. 2. Lipitor 20 mg q.d. 3. Altace 5 mg q.d. 4. Metoprolol 50 mg q.d. 5. Aspirin.  DISCHARGE INSTRUCTIONS:  Refrain from any strenuous activity, driving x 2 days. Raise the left arm as outlined by Dr. Graciela Husbands.  Call the office if there is any swelling/bleeding from the groin or wound site.  The patient is scheduled for follow-up wound check with the [facility] on Monday, December 03, 1999, at 12 p.m.  The patient will then return for MEDIT II follow-up as per protocol with Dr. Odessa Fleming on Thursday, December 20, 1999, at 1:45 p.m.  DISCHARGE DIAGNOSES: 1. Nonsustained ventricular tachycardia.    a. No inducable ventricular tachycardia - EPS, November 15, 1999.    b. MEDIT II trial:  randomized to ACD implantation.    c. Status post Medtronic (dual chamber) implantation - secondary to       AV nodal conduction abnormality. 2. Ischemic cardiomyopathy.    a. Ejection fraction 30%.    b. Coronary artery bypass graft in 1989. 3. Hypertension. 4. Hyperlipidemia. DD:  11/16/99 TD:  11/16/99 Job: [id number]

## 2010-08-10 NOTE — Discharge Summary (Signed)
[name] [name], [name] [name]                 ACCOUNT NO.:  [id number]   MEDICAL RECORD NO.:  [id number]          PATIENT TYPE:  OIB   LOCATION:  2899                         FACILITY:  [facility]   PHYSICIAN:  Duke Salvia, M.D.  DATE OF BIRTH:  1930-08-23   DATE OF ADMISSION:  12/15/2003  DATE OF DISCHARGE:  12/15/2003                                 DISCHARGE SUMMARY   DISCHARGE DIAGNOSES:  1.  Previously implanted defibrillator on recall.  2.  Explantation of previously implanted device with pocket revision and      implantation of Guidant Vitality DS model, T125DR, implantable      cardioverter defibrillator by Dr. Duke Salvia.   SECONDARY DIAGNOSES:  1.  Ischemic cardiomyopathy.  2.  Three vessel coronary artery disease, status post coronary artery bypass      graft.  3.  Cardiolite in 2003 negative for ischemia.  4.  Status post implantation of implantable cardioverter defibrillator      consistent with MADIT-II guidelines.   PROCEDURE:  December 15, 2003, explantation of previously implanted ICD,  pocket revision, and implantation of Guidant Vitality ICD with  intraoperative defibrillator testing by Dr. Duke Salvia.   DISPOSITION:  After a suitable period of rest and recovery, the patient was  discharged the same day as generator change.  [name] [name] is discharged on the  following medications.   DISCHARGE MEDICATIONS:  1.  Keflex 500 mg b.i.d. for the next five days.  2.  Aspirin 81 mg q.d.  3.  Imdur 30 mg b.i.d.  4.  Altace 5 mg b.i.d.  5.  Metoprolol 50 mg b.i.d.  6.  Lipitor 40 mg q.d.   WOUND CARE:  The patient is to keep his incision dry for the next week, take  off the bandage on the morning of Friday, December 16, 2003.  Paint the  bandage with Betadine one time and leave open to air.  If there is pain,  Tylenol 325 mg 1-2 tablets q.4-6h. p.r.n.   FOLLOW UP:  He will come to the [facility] on January 02, 2004 at 10 a.m.  and if he has fever or swelling at  the incision site call [phone].   BRIEF HISTORY:  This is a 77 year old man who has a history of ischemic  cardiomyopathy.  He has severe three vessel coronary artery disease and has  had bypass surgery.  His last Cardiolite study in 2003 was negative for  ischemia.  He is status post ICD implantation for a history of prior  myocardial infarction and decreased ejection fraction.  Currently, his  device is on a recall list and he has come in for elective generator change.   HOSPITAL COURSE:  As described in discharge disposition.  The patient is  admitted electively to [facility] on December 15, 2003.  Generator change was successful and defibrillator function threshold testing  was also appropriate at less than or equal to 14 joules.  The patient was  discharged the same day without complications at the ICD pocket and with  follow up as  directed to the patient.       GM/MEDQ  D:  02/23/2004  T:  02/23/2004  Job:  [id number]   cc:   Duke Salvia, M.D.

## 2010-08-10 NOTE — Cardiovascular Report (Signed)
[place]. [facility]  Patient:    [name] [name], [name] [name]                        MRN: [id number] Proc. Date: 11/15/99 Adm. Date:  [id number] Disc. Date: [id number] Attending:  Nathen May CC:         Electrophysiology Laboratory  [place] [facility] - Attention:  Delsa Grana  Cardiac Catheterization Laboratory   Cardiac Catheterization  PREOPERATIVE DIAGNOSIS:  Nonsustained ventricular tachycardia with ischemic heart disease.  POSTOPERATIVE DIAGNOSIS:  Nonsustained ventricular tachycardia with ischemic heart disease.  PROCEDURE:  Invasive electrophysiological study.  CARDIOLOGIST:  Nathen May, M.D.  DESCRIPTION OF PROCEDURE:  Following the obtaining of the informed consent, the patient was brought to the electrophysiological laboratory and placed on the fluoroscopic table in the supine position.  After a routine prep and drape, a cardiac catheterization was performed with local anesthesia and conscious sedation.  Following the procedure, the catheters were removed. Hemostasis was obtained, and the patient was then prepared for a second procedure.  CATHETERS:  A 5-French quadripolar catheter was inserted via the left femoral vein to the right ventricular apex, and subsequently moved to the right high atrium.  A 5-French quadripolar catheter was inserted via the left femoral vein to the AV junction, and subsequently moved to the right ventricular outflow tract, and then returned to the AV junction.  Surface leads I, aVF, and V1 were monitored continuously throughout the procedure.  Following the insertion of the catheters, the stimulation protocol included incremental atrial pacing, incremental ventricular pacing, single atrial extra stimuli, at a paced cycle length of 600 msec.  Single, double, and triple ventricular stimuli from the right ventricular apex and the right ventricular outflow tract at a paced cycle length of 600 msec and 400  msec.  RESULTS: SURFACE ELECTROCARDIOGRAM Rhythm:  Sinus. Cycle length:     831 msec. PR interval:      186 msec. QR duration:      108 msec. QT interval:      402 msec. P-wave duration:  102 msec. Bundle branch:    Is absent. Pre-excitation:   Absent.  AV NODAL FUNCTION: AH interval:      131 msec. AV Wenckebach:    550 msec. AV nodal effective refractory period at a paced cycle length of 600 msec was 480 msec.  HIS-PURKINJE SYSTEM FUNCTION: HV interval:          51 msec. His bundle duration:  19 msec.  ACCESSORY PATHWAY FUNCTION:  No evidence of an accessory pathway was identified.  VENTRICULAR RESPONSE TO PROGRAMMED STIMULATION: Effective refractory period at the right ventricular apex at a paced cycle length of 600 msec was 250 msec, and at 400 msec was 240 msec.  The ventricular effective refractory period at the right ventricular outflow tract at a paced cycle length of 600 msec is 250 msec, and at 400 msec is 230 msec.  The closest coupling interval at the right ventricular apex at a paced cycle length of 600 msec was 264, 200, 200.  At 400 msec was 240, 200, 200.  The closest coupling interval at the right ventricular outflow tract at a paced cycle length of 600 msec was 274, 210, 210, and at 400 msec was 240/200, 200.  ARRHYTHMIAS INDUCED:  Nonsustained ventricular tachycardia was reproducibly induced.  The longest episode was eight beats.  IMPRESSION: 1. Normal sinus function. 2. Normal atrial function. 3. Abnormal atrioventricular nodal function with  prolonged atrioventricular    Wenckebach cycle length and baseline atrioventricular conduction. 4. Normal His-Purkinje system function. 5. No accessory pathway. 6. No inducible sustained monomorphic ventricular tachycardia.  RESULTS:  Based on the above results, the patient is stratified to lower risk, based on the MADIT-1 results.  He does have markedly abnormal AV nodal conduction, but has had no prior  syncope.  RECOMMENDATIONS:  Standard medical therapy, versus consideration for randomization in the MADIT-2 protocol. DD:  11/15/99 TD:  11/16/99 Job: [id number]

## 2010-08-10 NOTE — Assessment & Plan Note (Signed)
[name]                              CARDIOLOGY OFFICE NOTE   NAME:[name] [name]                        MRN:          [id number]  DATE:12/23/2005                            DOB:          1931/01/04    PRIMARY CARE PHYSICIAN:  [name] [name], M.D.   CLINICAL HISTORY:  [name] [name] is 77 years old and has coronary heart disease  and had bypass surgery in 1989 with documented occlusion of a vein graft to  the diagonal branch of the LAD in 2001.  He also has an ejection fraction of  30% and has a VV pacer/ICD in place.   He says he has been doing extremely well.  He says he has had no chest pain,  no shortness of breath and no palpitations.   His past medical history is significant for chronic septal pulmonary  disease, hyperlipidemia and hypertension.  His current medications include  metoprolol, Imdur, Vytorin, aspirin and Altace.   On examination, the blood pressure was 140/78 and the pulse 64 and regular.  There was no venous distention. The carotid pulses were full without bruits.  Chest was clear.  Bilateral breath sounds were decreased.  The cardiac  rhythm was regular.  Had no murmurs or gallops.  The abdomen was soft  without organomegaly.  Peripheral pulses were full and no peripheral edema.   Electrocardiogram showed left bundle branch block with left axis deviation.  We interrogated the pacemaker and he had a good threshold in both the atrial  and ventricular leads.  There were no shocks and there were three episodes  of antitachycardia pacing.  He is programmed a DDI at a rate of 55 and a  long AV delay and he is sensing both chambers most all the time.   IMPRESSION:  1. Coronary artery disease, status post coronary artery bypass graft      surgery in 1989 with documented occlusion of the vein graft to the      diagonal branch of the left anterior descending artery.  2. Ischemic cardiomyopathy with ejection fraction of 30%.  3.  Status post DDD pacer/implantable cardioverter defibrillator.  4. Hyperlipidemia.  5. Hypertension.  6. Chronic obstructive pulmonary disease.   RECOMMENDATIONS:  I think [name] [name] is doing well.  He is now 18 years post  bypass and we will plan to do a rest stress Adenosine-Myoview scan.  We will  also get a fasting lipid, liver, CBC  and BNP when he comes in for his stress.  I will plan to see him back in a  year and we will check his defibrillator in six months or sooner per  scheduling protocol.            ______________________________  [name] Beals Juanda Chance, MD, [facility]     BRB/MedQ  DD:  12/23/2005  DT:  12/23/2005  Job #:  [id number]

## 2010-08-10 NOTE — Op Note (Signed)
NAMEAMBERS, IYENGAR                 ACCOUNT NO.:  [id number]   MEDICAL RECORD NO.:  [id number]          PATIENT TYPE:  OIB   LOCATION:  2899                         FACILITY:  [facility]   PHYSICIAN:  Duke Salvia, M.D.  DATE OF BIRTH:  1930-12-20   DATE OF PROCEDURE:  12/15/2003  DATE OF DISCHARGE:                                 OPERATIVE REPORT   PREOPERATIVE DIAGNOSIS:  Previously implanted defibrillator as part of MADIT  II, with now an ICD on recall.   POSTOPERATIVE DIAGNOSIS:  Previously implanted defibrillator as part of  MADIT II, with now an ICD on recall.   OPERATION PERFORMED:  Explantation of a previously implanted device, pocket  revision and  implantation of a new defibrillator with intraoperative  defibrillation threshold testing.   SURGEON:  Duke Salvia, M.D.   DESCRIPTION OF PROCEDURE:  Following the obtaining of informed consent, the  patient was brought to the electrophysiology laboratory and placed on the  fluoroscopic table in supine position.  After routine prep and drape,  lidocaine was infiltrated along the previous incision.  The incision was  opened and carried down to the layer of the device pocket using sharp  dissection.  The pocket was opened and the device was explanted.   Interrogation of the previously implanted lead demonstrated a Medtronic  atrial lead with a P-wave of 2.9, impedance of 479, a threshold of 0.4 to  0.5 and a previously implanted ventricular defibrillator lead 0148, serial  number [id number] with an R-wave of 22.5 and impedance of 680, and a threshold  of 0.7 V at 0.5 msec.  The Medtronic lead was a 6940 52 cm lead serial  number [id number].  With these acceptable parameters recorded, the leads  were then attached to a Guidant Vitality DS model T125DR ICD serial number  [id number].  Through the device, the bipolar P-wave was 2.8 mV with a pacing  impedance of 620 ohms and a threshold of __________ V at 0.5 msec with an R-  wave of 22  mV with a pacing impedance of 598 ohms and a threshold of 0.8 V  at 0.5 msec.  With these acceptable parameters recorded, defibrillation  threshold testing was undertaken.  Ventricular fibrillation was induced via  the T-wave shock.  After a total duration of 5.5 seconds a 14 joule shock  was delivered through a measured resistance of 44 ohms, terminating  ventricular fibrillation and restoring sinus rhythm.  With these acceptable  parameters recorded, the system was implanted.  The pocket was copiously  irrigated with antibiotic containing saline solution. Hemostasis was assured  and the leads and the pulse generator were then placed in the pocket.  The  wound was closed in three layers in normal fashion.  The wound was washed,  dried and a Benzoin and Steri-Strip dressing was applied.  Sponge, needle  and instrument counts were correct at the end of the procedure according to  staff.  The patient tolerated the procedure without apparent complication.   It should be noted that the patient's change out defibrillator has a  different footprint  requiring revision of the pocket with extension of the  pocket inferiorly.       SCK/MEDQ  D:  12/15/2003  T:  12/16/2003  Job:  [id number]

## 2010-08-17 ENCOUNTER — Telehealth: Payer: Self-pay | Admitting: Cardiovascular Disease

## 2010-08-17 MED ORDER — SIMVASTATIN 40 MG PO TABS: 40.0000 mg | ORAL_TABLET | Freq: Every day | ORAL | Status: DC

## 2010-08-17 NOTE — Telephone Encounter (Signed)
Simvastatin 40mg  uses [facility] 90 days with refills pharmacy requested three days ago,now pt out, needs today

## 2010-08-28 ENCOUNTER — Encounter: Payer: Self-pay | Admitting: Cardiovascular Disease

## 2010-09-26 ENCOUNTER — Other Ambulatory Visit: Payer: Self-pay | Admitting: Internal Medicine

## 2010-09-28 ENCOUNTER — Ambulatory Visit (INDEPENDENT_AMBULATORY_CARE_PROVIDER_SITE_OTHER): Payer: Medicare Other | Admitting: Cardiovascular Disease

## 2010-09-28 ENCOUNTER — Encounter: Payer: Self-pay | Admitting: Cardiovascular Disease

## 2010-09-28 VITALS — BP 134/60 | HR 61 | Ht 67.0 in | Wt 150.0 lb

## 2010-09-28 DIAGNOSIS — I2510 Atherosclerotic heart disease of native coronary artery without angina pectoris: Secondary | ICD-10-CM

## 2010-09-28 MED ORDER — SILDENAFIL CITRATE 50 MG PO TABS: 50.0000 mg | ORAL_TABLET | Freq: Every day | ORAL | Status: AC | PRN

## 2010-09-28 MED ORDER — NITROGLYCERIN 0.4 MG SL SUBL: 0.4000 mg | SUBLINGUAL_TABLET | SUBLINGUAL | Status: DC | PRN

## 2010-09-28 NOTE — Patient Instructions (Signed)
Your physician recommends that you schedule a follow-up appointment in: 6 months with Dr. McAlhany.  

## 2010-09-28 NOTE — Progress Notes (Signed)
History of Present Illness:77 yo [name] with history of CAD and CHF who has been followed in the past by Dr. Juanda Chance. He is here today for cardiac follow up. He had remote bypass surgery. He has an ischemic cardio myopathy with an ejection fraction of 30% but improved to 35-40% following biventricular pacing. He had an ICD placed in 2009 had an upgrade to a biventricular pacemaker. Because of symptoms of fatigue he underwent catheterization in July of 2011. He had total occlusion of the vein graft to the right coronary artery and total occlusion of the vein graft to the diagonal branch. Ejection fraction was 40% and his wedge pressure was 22.   He is here today for follow up. He is doing well.  NO chest pain. Breathing is ok. No dizziness, near syncope or syncope. He stopped his Isordil because he wants to try Viagra.   Past Medical History  Diagnosis Date  . COPD (chronic obstructive pulmonary disease)   . CAD (coronary artery disease)     status post coronary bypass graft surgery in 1989 with documented occulsion of the vein graft to the diagonal branch of the left anterior descending in 2001.  . Ischemic cardiomyopathy     ejection fraction of 30% improved 35-40% 5/09  . Systolic congestive heart failure     Class III systolic congestive heart, improved to class II, euvolemic.  . Dual ICD (implantable cardiac defibrillator) in place     with recent upgrade to a biventricular defibrillator - St. Jude Promote [id number]  . Hyperlipidemia   . Hypertension     Past Surgical History  Procedure Date  . Cardiac assist device removal 12/15/03    Explantation of a previously implanted device, pocket revision and implantation of a new defibrillator with intraoperative defibrillation threshold testing. SURGEON: Duke Salvia, M.D. 12/15/03.  Dedra Skeens     contrast veinography and implantation of dual-chamber defibrillator by Nathen May, M.D. 11/15/99  . Cardiac defibrillator placement    St. Jude Promote [id number]  . Coronary artery bypass graft 1989    Current Outpatient Prescriptions  Medication Sig Dispense Refill  . aspirin 81 MG tablet Take 81 mg by mouth daily.        . bisoprolol (ZEBETA) 5 MG tablet TAKE ONE TABLET BY MOUTH EVERY DAY  30 tablet  9  . furosemide (LASIX) 40 MG tablet Take 40 mg by mouth daily.        . isosorbide dinitrate (ISORDIL) 40 MG tablet Take 40 mg by mouth Nightly. 2 tablets       . isosorbide mononitrate (IMDUR) 30 MG CR tablet Take 30 mg by mouth 2 (two) times daily.        . Multiple Vitamins-Minerals (MULTIVITAMIN WITH MINERALS) tablet Take 1 tablet by mouth daily.        . ramipril (ALTACE) 10 MG capsule Take 10 mg by mouth 2 (two) times daily.        . simvastatin (ZOCOR) 40 MG tablet Take 1 tablet (40 mg total) by mouth at bedtime.  90 tablet  1  . spironolactone (ALDACTONE) 25 MG tablet TAKE ONE-HALF TABLET BY MOUTH EVERY DAY  15 tablet  9    No Known Allergies  History   Social History  . Marital Status: Widowed    Spouse Name: N/A    Number of Children: 5  . Years of Education: N/A   Occupational History  .     Social History Main Topics  .  Smoking status: Former Smoker -- 1.0 packs/day for 43 years    Types: Cigarettes    Quit date: 12/07/1987  . Smokeless tobacco: Not on file  . Alcohol Use: No  . Drug Use: No  . Sexually Active: Not on file   Other Topics Concern  . Not on file   Social History Narrative  . No narrative on file    No family history on file.  Review of Systems:  As stated in the HPI and otherwise negative.   BP 134/60  Pulse 61  Ht 5\' 7"  (1.702 m)  Wt 150 lb (68.04 kg)  BMI 23.49 kg/m2  Physical Examination: General: Well developed, well nourished, NAD HEENT: OP clear, mucus membranes moist SKIN: warm, dry. No rashes. Neuro: No focal deficits Musculoskeletal: Muscle strength 5/5 all ext Psychiatric: Mood and affect normal Neck: No JVD, no carotid bruits, no thyromegaly, no  lymphadenopathy. Lungs:Clear bilaterally, no wheezes, rhonci, crackles Cardiovascular: Regular rate and rhythm. No murmurs, gallops or rubs. Abdomen:Soft. Bowel sounds present. Non-tender.  Extremities: No lower extremity edema. Pulses are 2 + in the bilateral DP/PT.  WJX:[id number] pacemaker

## 2010-09-28 NOTE — Assessment & Plan Note (Signed)
Stable. He will stay off of the Isordil for now. No there changes. Doing well. He will try Viagra for ED.

## 2010-10-11 ENCOUNTER — Ambulatory Visit (INDEPENDENT_AMBULATORY_CARE_PROVIDER_SITE_OTHER): Payer: Medicare Other | Admitting: *Deleted

## 2010-10-11 ENCOUNTER — Other Ambulatory Visit: Payer: Self-pay | Admitting: Internal Medicine

## 2010-10-11 DIAGNOSIS — I428 Other cardiomyopathies: Secondary | ICD-10-CM

## 2010-10-11 DIAGNOSIS — Z95810 Presence of automatic (implantable) cardiac defibrillator: Secondary | ICD-10-CM

## 2010-10-11 LAB — REMOTE ICD DEVICE
AL AMPLITUDE: 2.3 mv
BATTERY VOLTAGE: 2.92 V
LV LEAD IMPEDENCE ICD: 550 Ohm
MODE SWITCH EPISODES: 2
RV LEAD IMPEDENCE ICD: 400 Ohm
TZAT-0012SLOWVT: 200 ms
TZAT-0013FASTVT: 1
TZAT-0013SLOWVT: 3
TZAT-0018FASTVT: NEGATIVE
TZAT-0018SLOWVT: NEGATIVE
TZAT-0020FASTVT: 1 ms
TZAT-0020SLOWVT: 1 ms
TZON-0003FASTVT: 320 ms
TZON-0003SLOWVT: 375 ms
TZON-0004SLOWVT: 12
TZON-0005FASTVT: 6
TZON-0005SLOWVT: 6
TZON-0010FASTVT: 80 ms
TZST-0001FASTVT: 2
TZST-0001FASTVT: 4
TZST-0001SLOWVT: 4
TZST-0001SLOWVT: 5
TZST-0003FASTVT: 36 J
TZST-0003FASTVT: 36 J
TZST-0003SLOWVT: 36 J
VENTRICULAR PACING ICD: 97 pct

## 2010-10-17 ENCOUNTER — Encounter: Payer: Self-pay | Admitting: *Deleted

## 2010-10-17 NOTE — Progress Notes (Signed)
icd remote check  

## 2010-10-29 ENCOUNTER — Other Ambulatory Visit: Payer: Self-pay | Admitting: Cardiovascular Disease

## 2010-10-29 MED ORDER — RAMIPRIL 10 MG PO CAPS: 10.0000 mg | ORAL_CAPSULE | Freq: Two times a day (BID) | ORAL | Status: DC

## 2011-01-10 ENCOUNTER — Ambulatory Visit (INDEPENDENT_AMBULATORY_CARE_PROVIDER_SITE_OTHER): Payer: Medicare Other | Admitting: *Deleted

## 2011-01-10 DIAGNOSIS — Z95810 Presence of automatic (implantable) cardiac defibrillator: Secondary | ICD-10-CM

## 2011-01-10 DIAGNOSIS — I428 Other cardiomyopathies: Secondary | ICD-10-CM

## 2011-01-11 ENCOUNTER — Other Ambulatory Visit: Payer: Self-pay | Admitting: Internal Medicine

## 2011-01-11 ENCOUNTER — Encounter: Payer: Self-pay | Admitting: Internal Medicine

## 2011-01-11 LAB — REMOTE ICD DEVICE
AL IMPEDENCE ICD: 400 Ohm
BAMS-0001: 150 {beats}/min
LV LEAD IMPEDENCE ICD: 540 Ohm
RV LEAD AMPLITUDE: 4.2 mv
TZAT-0001SLOWVT: 1
TZAT-0004FASTVT: 8
TZAT-0013FASTVT: 1
TZAT-0013SLOWVT: 3
TZAT-0018FASTVT: NEGATIVE
TZON-0003FASTVT: 320 ms
TZST-0001FASTVT: 3
TZST-0001SLOWVT: 2
TZST-0001SLOWVT: 5
TZST-0003FASTVT: 36 J
TZST-0003FASTVT: 36 J
TZST-0003SLOWVT: 25 J
TZST-0003SLOWVT: 36 J
TZST-0003SLOWVT: 36 J

## 2011-01-22 ENCOUNTER — Encounter: Payer: Self-pay | Admitting: *Deleted

## 2011-01-22 NOTE — Progress Notes (Signed)
icd remote check  

## 2011-04-11 ENCOUNTER — Ambulatory Visit (INDEPENDENT_AMBULATORY_CARE_PROVIDER_SITE_OTHER): Payer: Medicare Other | Admitting: Cardiovascular Disease

## 2011-04-11 ENCOUNTER — Ambulatory Visit (INDEPENDENT_AMBULATORY_CARE_PROVIDER_SITE_OTHER): Payer: Medicare Other | Admitting: *Deleted

## 2011-04-11 ENCOUNTER — Encounter: Payer: Self-pay | Admitting: Cardiovascular Disease

## 2011-04-11 VITALS — BP 143/74 | HR 59 | Ht 67.0 in | Wt 155.0 lb

## 2011-04-11 DIAGNOSIS — I2510 Atherosclerotic heart disease of native coronary artery without angina pectoris: Secondary | ICD-10-CM

## 2011-04-11 DIAGNOSIS — I428 Other cardiomyopathies: Secondary | ICD-10-CM

## 2011-04-11 LAB — REMOTE ICD DEVICE
AL AMPLITUDE: 2.8 mv
BATTERY VOLTAGE: 2.66 V
MODE SWITCH EPISODES: 1
RV LEAD AMPLITUDE: 6.3 mv
TZAT-0012SLOWVT: 200 ms
TZAT-0013FASTVT: 1
TZAT-0013SLOWVT: 3
TZAT-0018FASTVT: NEGATIVE
TZAT-0018SLOWVT: NEGATIVE
TZAT-0019SLOWVT: 7.5 V
TZAT-0020FASTVT: 1 ms
TZAT-0020SLOWVT: 1 ms
TZON-0003SLOWVT: 375 ms
TZON-0004SLOWVT: 12
TZON-0005FASTVT: 6
TZON-0005SLOWVT: 6
TZON-0010FASTVT: 80 ms
TZST-0001FASTVT: 2
TZST-0001SLOWVT: 2
TZST-0001SLOWVT: 4
TZST-0003FASTVT: 36 J
TZST-0003FASTVT: 36 J
TZST-0003SLOWVT: 36 J
VENTRICULAR PACING ICD: 93 pct

## 2011-04-11 NOTE — Progress Notes (Signed)
History of Present Illness: 78 yo WM with history of CAD and CHF who has been followed in the past by Dr. Juanda Chance. He is here today for cardiac follow up. He had remote bypass surgery. He has an ischemic cardiomyopathy with an ejection fraction of 30% but improved to 35-40% following biventricular pacing. He had an ICD placed and in 2009 had an upgrade to a biventricular pacemaker. Because of symptoms of fatigue he underwent catheterization in July of 2011. He had total occlusion of the vein graft to the right coronary artery and total occlusion of the vein graft to the diagonal branch. Ejection fraction was 40% and his wedge pressure was 22. I last saw him in July 2012.   He is here today for follow up. He is doing well. NO chest pain. Breathing is ok. No dizziness, near syncope or syncope. He stopped his Isordil before the last visit because he wanted to try Viagra. The Viagra did not work well.   Primary care is Dr. Foy Guadalajara in [place].    Past Medical History  Diagnosis Date  . COPD (chronic obstructive pulmonary disease)   . CAD (coronary artery disease)     status post coronary bypass graft surgery in 1989 with documented occulsion of the vein graft to the diagonal branch of the left anterior descending in 2001.  . Ischemic cardiomyopathy     ejection fraction of 30% improved 35-40% 5/09  . Systolic congestive heart failure     Class III systolic congestive heart, improved to class II, euvolemic.  . Dual ICD (implantable cardiac defibrillator) in place     with recent upgrade to a biventricular defibrillator - St. Jude Promote [id number]  . Hyperlipidemia   . Hypertension     Past Surgical History  Procedure Date  . Cardiac assist device removal 12/15/03    Explantation of a previously implanted device, pocket revision and implantation of a new defibrillator with intraoperative defibrillation threshold testing. SURGEON: Duke Salvia, M.D. 12/15/03.  Dedra Skeens     contrast  veinography and implantation of dual-chamber defibrillator by Nathen May, M.D. 11/15/99  . Cardiac defibrillator placement     St. Jude Promote [id number]  . Coronary artery bypass graft 1989    Current Outpatient Prescriptions  Medication Sig Dispense Refill  . aspirin 81 MG tablet Take 81 mg by mouth daily.        . bisoprolol (ZEBETA) 5 MG tablet TAKE ONE TABLET BY MOUTH EVERY DAY  30 tablet  9  . furosemide (LASIX) 40 MG tablet TAKE ONE TABLET BY MOUTH EVERY DAY  30 tablet  12  . Multiple Vitamins-Minerals (MULTIVITAMIN WITH MINERALS) tablet Take 1 tablet by mouth daily.        . nitroGLYCERIN (NITROSTAT) 0.4 MG SL tablet Place 1 tablet (0.4 mg total) under the tongue every 5 (five) minutes as needed for chest pain.  90 tablet  12  . ramipril (ALTACE) 10 MG capsule Take 1 capsule (10 mg total) by mouth 2 (two) times daily.  60 capsule  6  . spironolactone (ALDACTONE) 25 MG tablet TAKE ONE-HALF TABLET BY MOUTH EVERY DAY  15 tablet  9    No Known Allergies  History   Social History  . Marital Status: Widowed    Spouse Name: N/A    Number of Children: 5  . Years of Education: N/A   Occupational History  .     Social History Main Topics  . Smoking status: Former  Smoker -- 1.0 packs/day for 43 years    Types: Cigarettes    Quit date: 12/07/1987  . Smokeless tobacco: Not on file  . Alcohol Use: No  . Drug Use: No  . Sexually Active: Not on file   Other Topics Concern  . Not on file   Social History Narrative  . No narrative on file    No family history on file.  Review of Systems:  As stated in the HPI and otherwise negative.   BP 143/74  Pulse 59  Ht 5\' 7"  (1.702 m)  Wt 155 lb (70.308 kg)  BMI 24.28 kg/m2  Physical Examination: General: Well developed, well nourished, NAD HEENT: OP clear, mucus membranes moist SKIN: warm, dry. No rashes. Neuro: No focal deficits Musculoskeletal: Muscle strength 5/5 all ext Psychiatric: Mood and affect normal Neck:  No JVD, no carotid bruits, no thyromegaly, no lymphadenopathy. Lungs:Clear bilaterally, no wheezes, rhonci, crackles Cardiovascular: Regular rate and rhythm. No murmurs, gallops or rubs. Abdomen:Soft. Bowel sounds present. Non-tender.  Extremities: No lower extremity edema. Pulses are 2 + in the bilateral DP/PT.

## 2011-04-11 NOTE — Patient Instructions (Signed)
Your physician wants you to follow-up in: 12 months.You will receive a reminder letter in the mail two months in advance. If you don't receive a letter, please call our office to schedule the follow-up appointment.  Your physician recommends that you have fasting lab work done in April on day of appointment with Dr. Graciela Husbands.---Lipid and Liver profile.

## 2011-04-11 NOTE — Assessment & Plan Note (Signed)
Stable. Will continue current therapy. He does not wish to take a statin. He has a home remedy for his cholesterol. Will check fasting lipids when he returns to our office to see Dr. Graciela Husbands in April. No other changes.

## 2011-04-22 NOTE — Progress Notes (Signed)
Remote icd check  

## 2011-04-30 ENCOUNTER — Telehealth: Payer: Self-pay | Admitting: Cardiovascular Disease

## 2011-04-30 NOTE — Telephone Encounter (Signed)
Fu call °Patient returning your call °

## 2011-04-30 NOTE — Telephone Encounter (Signed)
Left message to call back.  I do not see that a call was placed to pt

## 2011-05-08 NOTE — Telephone Encounter (Signed)
Spoke with pt. He states he has been having problems with phone recently. He does not have any questions.

## 2011-06-06 ENCOUNTER — Ambulatory Visit (INDEPENDENT_AMBULATORY_CARE_PROVIDER_SITE_OTHER): Payer: Medicare Other | Admitting: Internal Medicine

## 2011-06-06 ENCOUNTER — Other Ambulatory Visit (INDEPENDENT_AMBULATORY_CARE_PROVIDER_SITE_OTHER): Payer: Medicare Other

## 2011-06-06 ENCOUNTER — Encounter: Payer: Self-pay | Admitting: Internal Medicine

## 2011-06-06 VITALS — BP 120/78 | HR 60 | Ht 67.0 in | Wt 155.8 lb

## 2011-06-06 DIAGNOSIS — I428 Other cardiomyopathies: Secondary | ICD-10-CM

## 2011-06-06 DIAGNOSIS — I2589 Other forms of chronic ischemic heart disease: Secondary | ICD-10-CM

## 2011-06-06 DIAGNOSIS — I5022 Chronic systolic (congestive) heart failure: Secondary | ICD-10-CM

## 2011-06-06 DIAGNOSIS — I2510 Atherosclerotic heart disease of native coronary artery without angina pectoris: Secondary | ICD-10-CM

## 2011-06-06 DIAGNOSIS — Z95810 Presence of automatic (implantable) cardiac defibrillator: Secondary | ICD-10-CM

## 2011-06-06 LAB — ICD DEVICE OBSERVATION
AL IMPEDENCE ICD: 387.5 Ohm
AL THRESHOLD: 0.75 V
ATRIAL PACING ICD: 38 pct
BAMS-0001: 150 {beats}/min
BAMS-0003: 70 {beats}/min
CHARGE TIME: 12 s
DEVICE MODEL ICD: 471058
HV IMPEDENCE: 50 Ohm
LV LEAD IMPEDENCE ICD: 475 Ohm
LV LEAD THRESHOLD: 0.75 V
MODE SWITCH EPISODES: 3
PACEART VT: 0
RV LEAD THRESHOLD: 1.75 V
TOT-0006: 20091119000000
TOT-0007: 1
TZAT-0001FASTVT: 1
TZAT-0001SLOWVT: 1
TZAT-0004FASTVT: 8
TZAT-0004SLOWVT: 8
TZAT-0012FASTVT: 200 ms
TZAT-0019SLOWVT: 7.5 V
TZAT-0020FASTVT: 1 ms
TZAT-0020SLOWVT: 1 ms
TZON-0004SLOWVT: 12
TZON-0010SLOWVT: 80 ms
TZST-0001FASTVT: 3
TZST-0001FASTVT: 5
TZST-0001SLOWVT: 3
TZST-0001SLOWVT: 4
TZST-0003FASTVT: 36 J
TZST-0003SLOWVT: 25 J
TZST-0003SLOWVT: 36 J

## 2011-06-06 LAB — LIPID PANEL
Cholesterol: 298 mg/dL — ABNORMAL HIGH (ref 0–200)
HDL: 38.1 mg/dL — ABNORMAL LOW (ref >39.00)
VLDL: 31.8 mg/dL (ref 0.0–40.0)

## 2011-06-06 LAB — HEPATIC FUNCTION PANEL
ALT: 15 U/L (ref 0–53)
Albumin: 4.2 g/dL (ref 3.5–5.2)
Bilirubin, Direct: 0 mg/dL (ref 0.0–0.3)
Total Protein: 7.4 g/dL (ref 6.0–8.3)

## 2011-06-06 NOTE — Progress Notes (Signed)
HPI  [name] [name] is a 78 y.o. male seen in followup for Ischemic cardiomyopathy congestive heart failure for which he is s/p CRT-D implantation.  He is s/p CABG and most recent EF was about 30-35% in fall of 2009.     He has low R waves ranging from 3.7-4.4 most recently; because of this he underwent repeat DFT testing to assess for adequate sensing. This was normal.  He says his breathing is much better since his AV optimization echo; he is hoping that there is greater benefit to be gained. He's not having chest pain or shortness of breath or edema   He was remarried in the fall.He is worried about sexual funciton   He takes isordil  Past Medical History  Diagnosis Date  . COPD (chronic obstructive pulmonary disease)   . CAD (coronary artery disease)     status post coronary bypass graft surgery in 1989 with documented occulsion of the vein graft to the diagonal branch of the left anterior descending in 2001.  . Ischemic cardiomyopathy     ejection fraction of 30% improved 35-40% 5/09  . Systolic congestive heart failure     Class III systolic congestive heart, improved to class II, euvolemic.  . Dual ICD (implantable cardiac defibrillator) in place     with recent upgrade to a biventricular defibrillator - St. Jude Promote [id number]  . Hyperlipidemia   . Hypertension     Past Surgical History  Procedure Date  . Cardiac assist device removal 12/15/03    Explantation of a previously implanted device, pocket revision and implantation of a new defibrillator with intraoperative defibrillation threshold testing. SURGEON: Duke Salvia, M.D. 12/15/03.  Dedra Skeens     contrast veinography and implantation of dual-chamber defibrillator by Nathen May, M.D. 11/15/99  . Cardiac defibrillator placement     St. Jude Promote [id number]  . Coronary artery bypass graft 1989    Current Outpatient Prescriptions  Medication Sig Dispense Refill  . aspirin 81 MG tablet Take 81 mg by  mouth daily.        . bisoprolol (ZEBETA) 5 MG tablet TAKE ONE TABLET BY MOUTH EVERY DAY  30 tablet  9  . furosemide (LASIX) 40 MG tablet TAKE ONE TABLET BY MOUTH EVERY DAY  30 tablet  12  . Multiple Vitamins-Minerals (MULTIVITAMIN WITH MINERALS) tablet Take 1 tablet by mouth daily.        . nitroGLYCERIN (NITROSTAT) 0.4 MG SL tablet Place 1 tablet (0.4 mg total) under the tongue every 5 (five) minutes as needed for chest pain.  90 tablet  12  . ramipril (ALTACE) 10 MG capsule Take 1 capsule (10 mg total) by mouth 2 (two) times daily.  60 capsule  6  . spironolactone (ALDACTONE) 25 MG tablet TAKE ONE-HALF TABLET BY MOUTH EVERY DAY  15 tablet  9    No Known Allergies  Review of Systems negative except from HPI and PMH BP 120/78  Pulse 60  Ht 5\' 7"  (1.702 m)  Wt 155 lb 12.8 oz (70.67 kg)  BMI 24.40 kg/m2  SpO2 95%  Physical Exam Well developed and well nourished in no acute distress HENT normal E scleral and icterus clear Neck Supple JVP flat;   Clear to ausculation Device pocket welllhealed Regular rate and rhythm, no murmurs gallops or rub Soft with active bowel sounds No clubbing cyanosis and edema Alert and oriented, grossly normal motor and sensory function Skin Warm and Dry  Assessment and  Plan

## 2011-06-06 NOTE — Assessment & Plan Note (Signed)
stabel  Continue meds

## 2011-06-06 NOTE — Assessment & Plan Note (Signed)
The patient's device was interrogated.  The information was reviewed.  Rate response was activated empiracally  And see if translates into improved exercise performance

## 2011-06-06 NOTE — Patient Instructions (Signed)

## 2011-06-06 NOTE — Assessment & Plan Note (Signed)
Stable continue ccurrent meds

## 2011-06-12 ENCOUNTER — Other Ambulatory Visit: Payer: Self-pay | Admitting: *Deleted

## 2011-06-12 DIAGNOSIS — E785 Hyperlipidemia, unspecified: Secondary | ICD-10-CM

## 2011-06-12 MED ORDER — EZETIMIBE 10 MG PO TABS: 10.0000 mg | ORAL_TABLET | Freq: Every day | ORAL | Status: DC

## 2011-06-18 ENCOUNTER — Telehealth: Payer: Self-pay | Admitting: Cardiovascular Disease

## 2011-06-18 DIAGNOSIS — E785 Hyperlipidemia, unspecified: Secondary | ICD-10-CM

## 2011-06-18 NOTE — Telephone Encounter (Signed)
Can we restart his Zocor at 40 mg po QHS? Thanks, chris

## 2011-06-18 NOTE — Telephone Encounter (Signed)
New Problem:    Patient called in to report that he is having some issues with his ezetimibe (ZETIA) 10 MG tablet. He is dizzy, headaches, burning sensation, numbness in his lips, and cramping, swelling and burning sensation in his ankles, trouble breathing.  Please call back.

## 2011-06-18 NOTE — Telephone Encounter (Signed)
Spoke with pt. He reports symptoms below after starting Zetia on June 12, 2011. He took it again on March 21 and 22 and then stopped.  He is not having any problems at this time and symptoms have resolved after stopping.  I told him he should not resume Zetia.  He states he may be able to tolerate Zocor as it only caused him to have leg cramping once or twice per week. Was taking 40 mg daily.   I told him I would send to Dr. Clifton James to see if he wanted to resume Zocor. Zetia added to allergies

## 2011-06-18 NOTE — Telephone Encounter (Signed)
Left message to call back  

## 2011-06-19 ENCOUNTER — Telehealth: Payer: Self-pay | Admitting: Cardiovascular Disease

## 2011-06-19 MED ORDER — SIMVASTATIN 40 MG PO TABS: 40.0000 mg | ORAL_TABLET | Freq: Every evening | ORAL | Status: DC

## 2011-06-19 NOTE — Telephone Encounter (Signed)
See phone note dated June 18, 2011

## 2011-06-19 NOTE — Telephone Encounter (Signed)
Spoke with pt. He will restart Zocor 40 mg daily and come in for fasting lipid and liver profile the last week of June.  He does not need new prescription at this time.  He is aware to call us if he has muscle pain.

## 2011-06-19 NOTE — Telephone Encounter (Signed)
Pt calling re medication that's not working, pls call

## 2011-07-18 ENCOUNTER — Other Ambulatory Visit: Payer: Self-pay | Admitting: Internal Medicine

## 2011-07-22 ENCOUNTER — Other Ambulatory Visit: Payer: Self-pay | Admitting: Internal Medicine

## 2011-07-22 MED ORDER — BISOPROLOL FUMARATE 5 MG PO TABS: 5.0000 mg | ORAL_TABLET | Freq: Every day | ORAL | Status: DC

## 2011-09-05 ENCOUNTER — Encounter: Payer: Medicare Other | Admitting: *Deleted

## 2011-09-10 ENCOUNTER — Other Ambulatory Visit: Payer: Medicare Other

## 2011-09-12 ENCOUNTER — Other Ambulatory Visit (INDEPENDENT_AMBULATORY_CARE_PROVIDER_SITE_OTHER): Payer: Medicare Other

## 2011-09-12 DIAGNOSIS — E785 Hyperlipidemia, unspecified: Secondary | ICD-10-CM

## 2011-09-12 LAB — HEPATIC FUNCTION PANEL
ALT: 16 U/L (ref 0–53)
AST: 18 U/L (ref 0–37)
Alkaline Phosphatase: 46 U/L (ref 39–117)
Bilirubin, Direct: 0.1 mg/dL (ref 0.0–0.3)
Total Bilirubin: 0.6 mg/dL (ref 0.3–1.2)

## 2011-09-12 LAB — LIPID PANEL
LDL Cholesterol: 93 mg/dL (ref 0–99)
Total CHOL/HDL Ratio: 4

## 2011-09-16 ENCOUNTER — Encounter: Payer: Self-pay | Admitting: *Deleted

## 2011-09-18 ENCOUNTER — Other Ambulatory Visit: Payer: Medicare Other

## 2011-09-24 ENCOUNTER — Ambulatory Visit (INDEPENDENT_AMBULATORY_CARE_PROVIDER_SITE_OTHER): Payer: Medicare Other | Admitting: *Deleted

## 2011-09-24 ENCOUNTER — Telehealth: Payer: Self-pay | Admitting: Internal Medicine

## 2011-09-24 ENCOUNTER — Encounter: Payer: Self-pay | Admitting: Internal Medicine

## 2011-09-24 ENCOUNTER — Other Ambulatory Visit: Payer: Self-pay | Admitting: Internal Medicine

## 2011-09-24 DIAGNOSIS — Z95810 Presence of automatic (implantable) cardiac defibrillator: Secondary | ICD-10-CM

## 2011-09-24 DIAGNOSIS — I5022 Chronic systolic (congestive) heart failure: Secondary | ICD-10-CM

## 2011-09-24 NOTE — Telephone Encounter (Signed)
Plz return call to patient at [phone], patient needs help with remote defib ck.  Patient sat in from of the device 3 times without any results 09/05/11

## 2011-09-24 NOTE — Telephone Encounter (Signed)
Transmissions received.  Left message with information.

## 2011-09-27 LAB — REMOTE ICD DEVICE
AL AMPLITUDE: 2.7 mv
AL IMPEDENCE ICD: 350 Ohm
ATRIAL PACING ICD: 47 pct
BAMS-0001: 150 {beats}/min
BATTERY VOLTAGE: 2.6 V
DEVICE MODEL ICD: 471058
HV IMPEDENCE: 43 Ohm
LV LEAD IMPEDENCE ICD: 510 Ohm
TZAT-0001FASTVT: 1
TZAT-0004FASTVT: 8
TZAT-0018SLOWVT: NEGATIVE
TZAT-0019FASTVT: 7.5 V
TZAT-0019SLOWVT: 7.5 V
TZAT-0020FASTVT: 1 ms
TZAT-0020SLOWVT: 1 ms
TZON-0003SLOWVT: 375 ms
TZON-0004FASTVT: 12
TZON-0004SLOWVT: 12
TZON-0005FASTVT: 6
TZON-0005SLOWVT: 6
TZON-0010FASTVT: 80 ms
TZON-0010SLOWVT: 80 ms
TZST-0001FASTVT: 4
TZST-0001SLOWVT: 2
TZST-0001SLOWVT: 3
TZST-0003FASTVT: 36 J
TZST-0003SLOWVT: 36 J
VENTRICULAR PACING ICD: 98 pct

## 2011-10-18 ENCOUNTER — Encounter: Payer: Self-pay | Admitting: *Deleted

## 2011-11-04 ENCOUNTER — Other Ambulatory Visit: Payer: Self-pay | Admitting: Cardiovascular Disease

## 2011-12-04 ENCOUNTER — Other Ambulatory Visit: Payer: Self-pay | Admitting: Cardiovascular Disease

## 2011-12-24 ENCOUNTER — Other Ambulatory Visit: Payer: Self-pay | Admitting: Cardiology

## 2011-12-30 ENCOUNTER — Encounter: Payer: Self-pay | Admitting: Internal Medicine

## 2011-12-30 ENCOUNTER — Encounter: Payer: Self-pay | Admitting: *Deleted

## 2011-12-30 ENCOUNTER — Ambulatory Visit (INDEPENDENT_AMBULATORY_CARE_PROVIDER_SITE_OTHER): Payer: Medicare Other | Admitting: *Deleted

## 2011-12-30 DIAGNOSIS — Z95810 Presence of automatic (implantable) cardiac defibrillator: Secondary | ICD-10-CM

## 2011-12-30 DIAGNOSIS — I2589 Other forms of chronic ischemic heart disease: Secondary | ICD-10-CM

## 2011-12-30 LAB — REMOTE ICD DEVICE
AL IMPEDENCE ICD: 390 Ohm
ATRIAL PACING ICD: 47 pct
BAMS-0001: 150 {beats}/min
BAMS-0003: 70 {beats}/min
BATTERY VOLTAGE: 2.59 V
DEVICE MODEL ICD: 471058
HV IMPEDENCE: 47 Ohm
LV LEAD IMPEDENCE ICD: 530 Ohm
PACEART VT: 1
RV LEAD IMPEDENCE ICD: 340 Ohm
TZAT-0001SLOWVT: 1
TZAT-0004FASTVT: 8
TZAT-0004SLOWVT: 8
TZAT-0012FASTVT: 200 ms
TZAT-0012SLOWVT: 200 ms
TZAT-0013FASTVT: 1
TZAT-0018FASTVT: NEGATIVE
TZAT-0018SLOWVT: NEGATIVE
TZAT-0019FASTVT: 7.5 V
TZAT-0020FASTVT: 1 ms
TZON-0003FASTVT: 320 ms
TZON-0003SLOWVT: 375 ms
TZON-0004FASTVT: 12
TZON-0004SLOWVT: 12
TZON-0005FASTVT: 6
TZON-0010SLOWVT: 80 ms
TZST-0001FASTVT: 3
TZST-0001FASTVT: 4
TZST-0001SLOWVT: 3
TZST-0001SLOWVT: 5
TZST-0003FASTVT: 36 J
TZST-0003FASTVT: 36 J
TZST-0003FASTVT: 36 J
TZST-0003SLOWVT: 25 J
TZST-0003SLOWVT: 36 J

## 2012-01-07 ENCOUNTER — Encounter: Payer: Self-pay | Admitting: *Deleted

## 2012-04-06 ENCOUNTER — Ambulatory Visit (INDEPENDENT_AMBULATORY_CARE_PROVIDER_SITE_OTHER): Payer: Medicare Other | Admitting: *Deleted

## 2012-04-06 ENCOUNTER — Encounter: Payer: Self-pay | Admitting: Internal Medicine

## 2012-04-06 DIAGNOSIS — I2589 Other forms of chronic ischemic heart disease: Secondary | ICD-10-CM

## 2012-04-06 DIAGNOSIS — Z95810 Presence of automatic (implantable) cardiac defibrillator: Secondary | ICD-10-CM

## 2012-04-09 LAB — REMOTE ICD DEVICE
AL AMPLITUDE: 2.4 mv
ATRIAL PACING ICD: 45 pct
BAMS-0001: 150 {beats}/min
BAMS-0003: 70 {beats}/min
HV IMPEDENCE: 51 Ohm
RV LEAD AMPLITUDE: 2.8 mv
TZAT-0001FASTVT: 1
TZAT-0001SLOWVT: 1
TZAT-0012FASTVT: 200 ms
TZAT-0019SLOWVT: 7.5 V
TZAT-0020FASTVT: 1 ms
TZAT-0020SLOWVT: 1 ms
TZON-0004SLOWVT: 12
TZON-0005FASTVT: 6
TZON-0005SLOWVT: 6
TZON-0010SLOWVT: 80 ms
TZST-0001FASTVT: 5
TZST-0001SLOWVT: 2
TZST-0001SLOWVT: 4
TZST-0003FASTVT: 36 J
TZST-0003FASTVT: 36 J
TZST-0003SLOWVT: 25 J
TZST-0003SLOWVT: 36 J

## 2012-04-16 ENCOUNTER — Ambulatory Visit: Payer: Medicare Other | Admitting: Cardiovascular Disease

## 2012-04-21 ENCOUNTER — Encounter: Payer: Self-pay | Admitting: *Deleted

## 2012-04-27 ENCOUNTER — Other Ambulatory Visit: Payer: Self-pay | Admitting: Internal Medicine

## 2012-05-25 ENCOUNTER — Ambulatory Visit: Payer: Medicare Other | Admitting: Cardiovascular Disease

## 2012-06-05 ENCOUNTER — Encounter: Payer: Self-pay | Admitting: Internal Medicine

## 2012-06-05 ENCOUNTER — Ambulatory Visit (INDEPENDENT_AMBULATORY_CARE_PROVIDER_SITE_OTHER): Payer: Medicare Other | Admitting: Internal Medicine

## 2012-06-05 VITALS — BP 143/77 | HR 60 | Ht 67.0 in | Wt 134.4 lb

## 2012-06-05 DIAGNOSIS — I5022 Chronic systolic (congestive) heart failure: Secondary | ICD-10-CM

## 2012-06-05 DIAGNOSIS — Z95810 Presence of automatic (implantable) cardiac defibrillator: Secondary | ICD-10-CM

## 2012-06-05 DIAGNOSIS — I2589 Other forms of chronic ischemic heart disease: Secondary | ICD-10-CM

## 2012-06-05 LAB — ICD DEVICE OBSERVATION
AL THRESHOLD: 0.75 V
ATRIAL PACING ICD: 48 pct
BAMS-0003: 70 {beats}/min
DEVICE MODEL ICD: 471058
RV LEAD IMPEDENCE ICD: 580 Ohm
TZAT-0001FASTVT: 1
TZAT-0004SLOWVT: 8
TZAT-0012FASTVT: 200 ms
TZAT-0012SLOWVT: 200 ms
TZAT-0013FASTVT: 1
TZAT-0018FASTVT: NEGATIVE
TZAT-0018SLOWVT: NEGATIVE
TZAT-0019FASTVT: 7.5 V
TZAT-0019SLOWVT: 7.5 V
TZAT-0020FASTVT: 1 ms
TZON-0003FASTVT: 320 ms
TZON-0003SLOWVT: 375 ms
TZON-0004FASTVT: 12
TZON-0004SLOWVT: 12
TZON-0005FASTVT: 6
TZST-0001FASTVT: 4
TZST-0001SLOWVT: 3
TZST-0001SLOWVT: 5
TZST-0003FASTVT: 36 J
TZST-0003FASTVT: 36 J
TZST-0003FASTVT: 36 J
TZST-0003SLOWVT: 25 J
TZST-0003SLOWVT: 36 J
VENTRICULAR PACING ICD: 96 pct

## 2012-06-05 NOTE — Assessment & Plan Note (Signed)
Stable continue current meds 

## 2012-06-05 NOTE — Progress Notes (Signed)
Patient Care Team: Lenora Boys, MD as PCP - General (Family Medicine)   HPI  [name] [name] is a 79 y.o. male seen in followup for Ischemic cardiomyopathy congestive heart failure for which he is s/p CRT-D implantation. He is s/p CABG and most recent EF was about 30-35% in fall of 2009.   He has stable low amplitude R waves  He says his breathing is much better since his AV optimization echo; he is hoping that there is greater benefit to be gained. He's not having chest pain or shortness of breath or edema     He is now married about 2-1/2 years.    Past Medical History  Diagnosis Date  . COPD (chronic obstructive pulmonary disease)   . CAD (coronary artery disease)     status post coronary bypass graft surgery in 1989 with documented occulsion of the vein graft to the diagonal branch of the left anterior descending in 2001.  . Ischemic cardiomyopathy     ejection fraction of 30% improved 35-40% 5/09  . Systolic congestive heart failure     Class III systolic congestive heart, improved to class II, euvolemic.  . Dual ICD (implantable cardiac defibrillator) in place     with recent upgrade to a biventricular defibrillator - St. Jude Promote [id number]  . Hyperlipidemia   . Hypertension     Past Surgical History  Procedure Laterality Date  . Cardiac assist device removal  12/15/03    Explantation of a previously implanted device, pocket revision and implantation of a new defibrillator with intraoperative defibrillation threshold testing. SURGEON: Duke Salvia, M.D. 12/15/03.  Dedra Skeens      contrast veinography and implantation of dual-chamber defibrillator by Nathen May, M.D. 11/15/99  . Cardiac defibrillator placement      St. Jude Promote [id number]  . Coronary artery bypass graft  1989    Current Outpatient Prescriptions  Medication Sig Dispense Refill  . aspirin 81 MG tablet Take 81 mg by mouth daily.        . bisoprolol (ZEBETA) 5 MG tablet Take 1 tablet  (5 mg total) by mouth daily.  30 tablet  8  . furosemide (LASIX) 40 MG tablet TAKE ONE TABLET BY MOUTH EVERY DAY.  30 tablet  6  . Multiple Vitamins-Minerals (MULTIVITAMIN WITH MINERALS) tablet Take 1 tablet by mouth daily.        . nitroGLYCERIN (NITROSTAT) 0.4 MG SL tablet Place 1 tablet (0.4 mg total) under the tongue every 5 (five) minutes as needed for chest pain.  90 tablet  12  . ramipril (ALTACE) 10 MG capsule TAKE ONE CAPSULE BY MOUTH TWICE DAILY  60 capsule  5  . simvastatin (ZOCOR) 40 MG tablet TAKE ONE TABLET BY MOUTH EVERY DAY AT BEDTIME  30 tablet  6  . spironolactone (ALDACTONE) 25 MG tablet TAKE ONE-HALF TABLET BY MOUTH EVERY DAY  15 tablet  5   No current facility-administered medications for this visit.    Allergies  Allergen Reactions  . Zetia (Ezetimibe) Other (See Comments)    Leg cramping, lips swelling, shortness of breath    Review of Systems negative except from HPI and PMH  Physical Exam BP 143/77  Pulse 60  Ht 5\' 7"  (1.702 m)  Wt 134 lb 6.4 oz (60.963 kg)  BMI 21.04 kg/m2 Well developed and well nourished in no acute distress HENT normal E scleral and icterus clear Neck Supple JVP 8-9; carotids brisk and full Clear to ausculation Device  pocket well healed; without hematoma or erythema   Regular rate and rhythm, 2/6 systolic murmur  Soft with active bowel sounds No clubbing cyanosis none Edema Alert and oriented, grossly normal motor and sensory function Skin Warm and Dry    Assessment and  Plan

## 2012-06-05 NOTE — Patient Instructions (Addendum)
Your physician wants you to follow-up in: ONE YEAR WITH DR KLEIN You will receive a reminder letter in the mail two months in advance. If you don't receive a letter, please call our office to schedule the follow-up appointment.    

## 2012-06-05 NOTE — Assessment & Plan Note (Addendum)
There is slight elevation in right-sided pressures However, in the absence of symptoms we'll continue him on his current indications

## 2012-06-05 NOTE — Assessment & Plan Note (Signed)
The patient's device was interrogated.  The information was reviewed. No changes were made in the programming.    

## 2012-06-11 ENCOUNTER — Other Ambulatory Visit: Payer: Self-pay

## 2012-06-11 MED ORDER — SIMVASTATIN 40 MG PO TABS: ORAL_TABLET | ORAL | Status: DC

## 2012-06-25 ENCOUNTER — Ambulatory Visit (INDEPENDENT_AMBULATORY_CARE_PROVIDER_SITE_OTHER): Payer: Medicare Other | Admitting: Cardiovascular Disease

## 2012-06-25 ENCOUNTER — Encounter: Payer: Self-pay | Admitting: Cardiovascular Disease

## 2012-06-25 VITALS — BP 120/66 | HR 61 | Ht 67.0 in | Wt 152.0 lb

## 2012-06-25 DIAGNOSIS — I2589 Other forms of chronic ischemic heart disease: Secondary | ICD-10-CM

## 2012-06-25 DIAGNOSIS — I2510 Atherosclerotic heart disease of native coronary artery without angina pectoris: Secondary | ICD-10-CM

## 2012-06-25 DIAGNOSIS — I255 Ischemic cardiomyopathy: Secondary | ICD-10-CM

## 2012-06-25 NOTE — Progress Notes (Signed)
History of Present Illness: 79 yo WM with history of CAD, ischemic CM s/p ICD, systolic CHF who has been followed in the past by Dr. Juanda Chance. He is here today for cardiac follow up. He had remote bypass surgery. He has an ischemic cardiomyopathy with an ejection fraction of 30% but improved to 35-40% following biventricular pacing. He had an ICD placed and in 2009 had an upgrade to a biventricular pacemaker. Because of symptoms of fatigue he underwent catheterization in July of 2011. He had total occlusion of the vein graft to the right coronary artery and total occlusion of the vein graft to the diagonal branch. Ejection fraction was 40%.  I last saw him in January 2013.    He is here today for follow up. He is doing well. NO chest pain. Breathing is ok. No dizziness, near syncope or syncope.   Primary Care Physician: Dr. Foy Guadalajara   Last Lipid Profile:Lipid Panel     Component Value Date/Time   CHOL 174 09/12/2011 0940   TRIG 189.0* 09/12/2011 0940   HDL 43.00 09/12/2011 0940   CHOLHDL 4 09/12/2011 0940   VLDL 37.8 09/12/2011 0940   LDLCALC 93 09/12/2011 0940     Past Medical History  Diagnosis Date  . COPD (chronic obstructive pulmonary disease)   . CAD (coronary artery disease)     status post coronary bypass graft surgery in 1989 with documented occulsion of the vein graft to the diagonal branch of the left anterior descending in 2001.  . Ischemic cardiomyopathy     ejection fraction of 30% improved 35-40% 5/09  . Systolic congestive heart failure     Class III systolic congestive heart, improved to class II, euvolemic.  . Dual ICD (implantable cardiac defibrillator) in place     with recent upgrade to a biventricular defibrillator - St. Jude Promote [id number]  . Hyperlipidemia   . Hypertension     Past Surgical History  Procedure Laterality Date  . Cardiac assist device removal  12/15/03    Explantation of a previously implanted device, pocket revision and implantation of a new  defibrillator with intraoperative defibrillation threshold testing. SURGEON: Duke Salvia, M.D. 12/15/03.  Dedra Skeens      contrast veinography and implantation of dual-chamber defibrillator by Nathen May, M.D. 11/15/99  . Cardiac defibrillator placement      St. Jude Promote [id number]  . Coronary artery bypass graft  1989    Current Outpatient Prescriptions  Medication Sig Dispense Refill  . aspirin 81 MG tablet Take 81 mg by mouth daily.        . bisoprolol (ZEBETA) 5 MG tablet Take 1 tablet (5 mg total) by mouth daily.  30 tablet  8  . furosemide (LASIX) 40 MG tablet TAKE ONE TABLET BY MOUTH EVERY DAY.  30 tablet  6  . Multiple Vitamins-Minerals (MULTIVITAMIN WITH MINERALS) tablet Take 1 tablet by mouth daily.        . nitroGLYCERIN (NITROSTAT) 0.4 MG SL tablet Place 0.4 mg under the tongue every 5 (five) minutes as needed for chest pain.      . ramipril (ALTACE) 10 MG capsule TAKE ONE CAPSULE BY MOUTH TWICE DAILY  60 capsule  5  . simvastatin (ZOCOR) 40 MG tablet TAKE ONE TABLET BY MOUTH EVERY DAY AT BEDTIME  30 tablet  6  . spironolactone (ALDACTONE) 25 MG tablet TAKE ONE-HALF TABLET BY MOUTH EVERY DAY  15 tablet  5   No current facility-administered medications for this visit.  Allergies  Allergen Reactions  . Zetia (Ezetimibe) Other (See Comments)    Leg cramping, lips swelling, shortness of breath    History   Social History  . Marital Status: Widowed    Spouse Name: N/A    Number of Children: 5  . Years of Education: N/A   Occupational History  .     Social History Main Topics  . Smoking status: Former Smoker -- 1.00 packs/day for 43 years    Types: Cigarettes    Quit date: 12/07/1987  . Smokeless tobacco: Not on file  . Alcohol Use: No  . Drug Use: No  . Sexually Active: Not on file   Other Topics Concern  . Not on file   Social History Narrative  . No narrative on file    No family history on file.  Review of Systems:  As stated in  the HPI and otherwise negative.   BP 120/66  Pulse 61  Ht 5\' 7"  (1.702 m)  Wt 152 lb (68.947 kg)  BMI 23.8 kg/m2  SpO2 96%  Physical Examination: General: Well developed, well nourished, NAD HEENT: OP clear, mucus membranes moist SKIN: warm, dry. No rashes. Neuro: No focal deficits Musculoskeletal: Muscle strength 5/5 all ext Psychiatric: Mood and affect normal Neck: No JVD, no carotid bruits, no thyromegaly, no lymphadenopathy. Lungs:Clear bilaterally, no wheezes, rhonci, crackles Cardiovascular: Regular rate and rhythm. No murmurs, gallops or rubs. Abdomen:Soft. Bowel sounds present. Non-tender.  Extremities: No lower extremity edema. Pulses are 2 + in the bilateral DP/PT.  EKG: AV paced.   Assessment and Plan:   1. CAD: Stable. Will continue current therapy. BP is well controlled.   2. Ischemic Cardiomyopathy: He is on good medications. Volume status is ok. Continue current meds. ICD followed by Dr. Graciela Husbands.

## 2012-06-25 NOTE — Patient Instructions (Addendum)
Your physician wants you to follow-up in:  12 months.  You will receive a reminder letter in the mail two months in advance. If you don't receive a letter, please call our office to schedule the follow-up appointment.   

## 2012-08-13 ENCOUNTER — Other Ambulatory Visit: Payer: Self-pay | Admitting: Cardiovascular Disease

## 2012-09-07 ENCOUNTER — Encounter: Payer: Medicare Other | Admitting: *Deleted

## 2012-09-08 ENCOUNTER — Encounter: Payer: Self-pay | Admitting: *Deleted

## 2012-09-14 ENCOUNTER — Encounter: Payer: Self-pay | Admitting: Internal Medicine

## 2012-09-14 ENCOUNTER — Other Ambulatory Visit: Payer: Self-pay | Admitting: Internal Medicine

## 2012-09-14 ENCOUNTER — Ambulatory Visit (INDEPENDENT_AMBULATORY_CARE_PROVIDER_SITE_OTHER): Payer: Medicare Other | Admitting: *Deleted

## 2012-09-14 DIAGNOSIS — I5022 Chronic systolic (congestive) heart failure: Secondary | ICD-10-CM

## 2012-09-14 DIAGNOSIS — I2589 Other forms of chronic ischemic heart disease: Secondary | ICD-10-CM

## 2012-09-14 DIAGNOSIS — Z95810 Presence of automatic (implantable) cardiac defibrillator: Secondary | ICD-10-CM

## 2012-09-14 LAB — REMOTE ICD DEVICE
AL AMPLITUDE: 2.1 mv
ATRIAL PACING ICD: 51 pct
BATTERY VOLTAGE: 2.57 V
DEVICE MODEL ICD: 471058
HV IMPEDENCE: 52 Ohm
RV LEAD IMPEDENCE ICD: 390 Ohm
TZAT-0001FASTVT: 1
TZAT-0004SLOWVT: 8
TZAT-0012FASTVT: 200 ms
TZAT-0012SLOWVT: 200 ms
TZAT-0018SLOWVT: NEGATIVE
TZAT-0019FASTVT: 7.5 V
TZAT-0019SLOWVT: 7.5 V
TZAT-0020FASTVT: 1 ms
TZON-0003SLOWVT: 375 ms
TZON-0004FASTVT: 12
TZON-0004SLOWVT: 12
TZON-0005FASTVT: 6
TZON-0005SLOWVT: 6
TZON-0010FASTVT: 80 ms
TZON-0010SLOWVT: 80 ms
TZST-0001FASTVT: 2
TZST-0001FASTVT: 4
TZST-0001SLOWVT: 3
TZST-0003FASTVT: 36 J
TZST-0003FASTVT: 36 J
VENTRICULAR PACING ICD: 97 pct

## 2012-09-14 NOTE — Telephone Encounter (Signed)
bisoprolol (ZEBETA) 5 MG tablet  Take 1 tablet (5 mg total) by mouth daily.   30 tablet   8   Duke Salvia, MD at 06/06/2012  2:55 PM

## 2012-09-30 ENCOUNTER — Encounter: Payer: Self-pay | Admitting: *Deleted

## 2012-10-19 ENCOUNTER — Other Ambulatory Visit: Payer: Self-pay | Admitting: Internal Medicine

## 2012-11-10 ENCOUNTER — Other Ambulatory Visit: Payer: Self-pay | Admitting: Cardiology

## 2012-11-12 ENCOUNTER — Other Ambulatory Visit: Payer: Self-pay

## 2012-11-12 MED ORDER — SPIRONOLACTONE 25 MG PO TABS: ORAL_TABLET | ORAL | Status: DC

## 2012-11-30 ENCOUNTER — Other Ambulatory Visit: Payer: Self-pay | Admitting: Internal Medicine

## 2012-12-21 ENCOUNTER — Ambulatory Visit (INDEPENDENT_AMBULATORY_CARE_PROVIDER_SITE_OTHER): Payer: Medicare Other | Admitting: *Deleted

## 2012-12-21 DIAGNOSIS — I428 Other cardiomyopathies: Secondary | ICD-10-CM

## 2012-12-21 LAB — REMOTE ICD DEVICE
AL AMPLITUDE: 2.6 mv
AL IMPEDENCE ICD: 430 Ohm
BAMS-0001: 150 {beats}/min
BAMS-0003: 70 {beats}/min
RV LEAD AMPLITUDE: 4.2 mv
RV LEAD IMPEDENCE ICD: 360 Ohm
TZAT-0001SLOWVT: 1
TZAT-0004FASTVT: 8
TZAT-0012FASTVT: 200 ms
TZAT-0012SLOWVT: 200 ms
TZAT-0013FASTVT: 1
TZAT-0018FASTVT: NEGATIVE
TZAT-0019SLOWVT: 7.5 V
TZAT-0020SLOWVT: 1 ms
TZON-0003FASTVT: 320 ms
TZON-0004FASTVT: 12
TZON-0005SLOWVT: 6
TZON-0010SLOWVT: 80 ms
TZST-0001FASTVT: 2
TZST-0001FASTVT: 3
TZST-0001FASTVT: 5
TZST-0001SLOWVT: 2
TZST-0001SLOWVT: 4
TZST-0003FASTVT: 36 J
TZST-0003FASTVT: 36 J
TZST-0003FASTVT: 36 J
TZST-0003SLOWVT: 25 J
TZST-0003SLOWVT: 36 J

## 2013-01-11 ENCOUNTER — Other Ambulatory Visit: Payer: Self-pay | Admitting: Internal Medicine

## 2013-01-13 ENCOUNTER — Encounter: Payer: Self-pay | Admitting: *Deleted

## 2013-01-29 ENCOUNTER — Encounter: Payer: Self-pay | Admitting: Internal Medicine

## 2013-02-01 ENCOUNTER — Other Ambulatory Visit: Payer: Self-pay | Admitting: Cardiovascular Disease

## 2013-02-01 ENCOUNTER — Other Ambulatory Visit: Payer: Self-pay | Admitting: Internal Medicine

## 2013-03-22 ENCOUNTER — Ambulatory Visit (INDEPENDENT_AMBULATORY_CARE_PROVIDER_SITE_OTHER): Payer: Medicare Other | Admitting: *Deleted

## 2013-03-22 ENCOUNTER — Encounter: Payer: Self-pay | Admitting: Internal Medicine

## 2013-03-22 DIAGNOSIS — I428 Other cardiomyopathies: Secondary | ICD-10-CM

## 2013-03-26 ENCOUNTER — Other Ambulatory Visit: Payer: Self-pay | Admitting: Cardiovascular Disease

## 2013-03-28 LAB — MDC_IDC_ENUM_SESS_TYPE_REMOTE
Battery Remaining Longevity: 13 mo
Battery Voltage: 2.56 V
Brady Statistic AP VP Percent: 58 %
Brady Statistic AP VS Percent: 1 %
Brady Statistic AS VP Percent: 39 %
Brady Statistic AS VS Percent: 1 %
Brady Statistic RA Percent Paced: 53 %
Date Time Interrogation Session: 20141229072307
HighPow Impedance: 56 Ohm
Implantable Pulse Generator Serial Number: 471058
Lead Channel Impedance Value: 360 Ohm
Lead Channel Impedance Value: 400 Ohm
Lead Channel Impedance Value: 540 Ohm
Lead Channel Pacing Threshold Amplitude: 0.5 V
Lead Channel Pacing Threshold Amplitude: 0.75 V
Lead Channel Pacing Threshold Amplitude: 1.75 V
Lead Channel Pacing Threshold Pulse Width: 0.5 ms
Lead Channel Pacing Threshold Pulse Width: 0.5 ms
Lead Channel Pacing Threshold Pulse Width: 1 ms
Lead Channel Sensing Intrinsic Amplitude: 2.6 mV
Lead Channel Sensing Intrinsic Amplitude: 3.6 mV
Lead Channel Setting Pacing Amplitude: 2 V
Lead Channel Setting Pacing Amplitude: 2 V
Lead Channel Setting Pacing Amplitude: 3.5 V
Lead Channel Setting Pacing Pulse Width: 0.5 ms
Lead Channel Setting Pacing Pulse Width: 1 ms
Lead Channel Setting Sensing Sensitivity: 0.3 mV
Zone Setting Detection Interval: 280 ms
Zone Setting Detection Interval: 320 ms
Zone Setting Detection Interval: 375 ms

## 2013-03-29 ENCOUNTER — Other Ambulatory Visit: Payer: Self-pay

## 2013-03-29 MED ORDER — RAMIPRIL 10 MG PO CAPS: ORAL_CAPSULE | ORAL | Status: DC

## 2013-04-08 ENCOUNTER — Encounter: Payer: Self-pay | Admitting: *Deleted

## 2013-05-31 ENCOUNTER — Other Ambulatory Visit: Payer: Self-pay | Admitting: Internal Medicine

## 2013-06-10 ENCOUNTER — Encounter: Payer: Self-pay | Admitting: Internal Medicine

## 2013-06-10 ENCOUNTER — Ambulatory Visit (INDEPENDENT_AMBULATORY_CARE_PROVIDER_SITE_OTHER): Payer: Medicare Other | Admitting: Internal Medicine

## 2013-06-10 VITALS — BP 137/67 | HR 60 | Ht 67.0 in | Wt 151.0 lb

## 2013-06-10 DIAGNOSIS — I2589 Other forms of chronic ischemic heart disease: Secondary | ICD-10-CM

## 2013-06-10 DIAGNOSIS — I255 Ischemic cardiomyopathy: Secondary | ICD-10-CM

## 2013-06-10 DIAGNOSIS — I5022 Chronic systolic (congestive) heart failure: Secondary | ICD-10-CM

## 2013-06-10 DIAGNOSIS — Z95810 Presence of automatic (implantable) cardiac defibrillator: Secondary | ICD-10-CM

## 2013-06-10 LAB — MDC_IDC_ENUM_SESS_TYPE_INCLINIC
Battery Remaining Longevity: 8.6 mo
Brady Statistic RA Percent Paced: 54 %
Brady Statistic RV Percent Paced: 96 %
Date Time Interrogation Session: 20150319123542
HIGH POWER IMPEDANCE MEASURED VALUE: 50.6207
Lead Channel Impedance Value: 412.5 Ohm
Lead Channel Impedance Value: 525 Ohm
Lead Channel Pacing Threshold Amplitude: 0.5 V
Lead Channel Pacing Threshold Amplitude: 0.5 V
Lead Channel Pacing Threshold Amplitude: 1 V
Lead Channel Pacing Threshold Amplitude: 2 V
Lead Channel Pacing Threshold Amplitude: 2 V
Lead Channel Pacing Threshold Pulse Width: 0.5 ms
Lead Channel Pacing Threshold Pulse Width: 0.5 ms
Lead Channel Pacing Threshold Pulse Width: 0.5 ms
Lead Channel Pacing Threshold Pulse Width: 1 ms
Lead Channel Sensing Intrinsic Amplitude: 2.4 mV
Lead Channel Sensing Intrinsic Amplitude: 3.3 mV
Lead Channel Setting Pacing Amplitude: 2 V
Lead Channel Setting Pacing Amplitude: 3.5 V
Lead Channel Setting Pacing Pulse Width: 0.5 ms
Lead Channel Setting Pacing Pulse Width: 1 ms
Lead Channel Setting Sensing Sensitivity: 0.3 mV
MDC IDC MSMT BATTERY VOLTAGE: 2.54 V
MDC IDC MSMT LEADCHNL LV PACING THRESHOLD PULSEWIDTH: 0.5 ms
MDC IDC MSMT LEADCHNL RA PACING THRESHOLD AMPLITUDE: 1 V
MDC IDC MSMT LEADCHNL RV IMPEDANCE VALUE: 337.5 Ohm
MDC IDC MSMT LEADCHNL RV PACING THRESHOLD PULSEWIDTH: 1 ms
MDC IDC PG SERIAL: 471058
MDC IDC SET LEADCHNL LV PACING AMPLITUDE: 2 V
MDC IDC SET ZONE DETECTION INTERVAL: 280 ms
MDC IDC SET ZONE DETECTION INTERVAL: 320 ms
Zone Setting Detection Interval: 375 ms

## 2013-06-10 LAB — BASIC METABOLIC PANEL
BUN: 19 mg/dL (ref 6–23)
CALCIUM: 9.6 mg/dL (ref 8.4–10.5)
CO2: 28 mEq/L (ref 19–32)
Chloride: 101 mEq/L (ref 96–112)
Creatinine, Ser: 1.5 mg/dL (ref 0.4–1.5)
GFR: 47.73 mL/min — ABNORMAL LOW (ref >60.00)
GLUCOSE: 99 mg/dL (ref 70–99)
POTASSIUM: 4.9 meq/L (ref 3.5–5.1)
Sodium: 139 mEq/L (ref 135–145)

## 2013-06-10 NOTE — Progress Notes (Signed)
Patient Care Team: Abigail Miyamoto, MD as PCP - General (Family Medicine)   HPI  [name] [name] is a 80 y.o. male seen in followup for Ischemic cardiomyopathy congestive heart failure for which he is s/p CRT-D implantation. He is s/p CABG and most recent EF was about 30-35% in fall of 2009.   He has stable low amplitude R waves  He says his breathing is much better since his AV optimization echo; he is hoping that there is greater benefit to be gained. He's not having chest pain or shortness of breath or edema  He is now married about a 3 years.  He had intercurrent ventricular tachycardia treated with antitachycardia pacing the patient was unaware of this.  The patient denies chest pain,  nocturnal dyspnea, orthopnea or peripheral edema.  There have been no palpitations, lightheadedness or syncope. He has cheronic shortness of breath    Past Medical History  Diagnosis Date  . COPD (chronic obstructive pulmonary disease)   . CAD (coronary artery disease)     status post coronary bypass graft surgery in 1989 with documented occulsion of the vein graft to the diagonal branch of the left anterior descending in 2001.  . Ischemic cardiomyopathy     ejection fraction of 30% improved 35-40% 5/09  . Systolic congestive heart failure     Class III systolic congestive heart, improved to class II, euvolemic.  . Dual ICD (implantable cardiac defibrillator) in place     with recent upgrade to a biventricular defibrillator - St. Jude Promote [id number]  . Hyperlipidemia   . Hypertension     Past Surgical History  Procedure Laterality Date  . Cardiac assist device removal  12/15/03    Explantation of a previously implanted device, pocket revision and implantation of a new defibrillator with intraoperative defibrillation threshold testing. SURGEON: [name], M.D. 12/15/03.  Hartley Barefoot      contrast veinography and implantation of dual-chamber defibrillator by Nikki Dom, M.D. 11/15/99  . Cardiac defibrillator placement      St. Jude Promote [id number]  . Coronary artery bypass graft  1989    Current Outpatient Prescriptions  Medication Sig Dispense Refill  . aspirin 81 MG tablet Take 81 mg by mouth daily.        . bisoprolol (ZEBETA) 5 MG tablet TAKE ONE TABLET BY MOUTH ONCE DAILY  30 tablet  0  . furosemide (LASIX) 40 MG tablet TAKE ONE TABLET BY MOUTH ONCE DAILY.  30 tablet  3  . Multiple Vitamins-Minerals (MULTIVITAMIN WITH MINERALS) tablet Take 1 tablet by mouth daily.        . nitroGLYCERIN (NITROSTAT) 0.4 MG SL tablet Place 0.4 mg under the tongue every 5 (five) minutes as needed for chest pain.      . ramipril (ALTACE) 10 MG capsule TAKE ONE CAPSULE BY MOUTH TWICE DAILY  60 capsule  6  . simvastatin (ZOCOR) 40 MG tablet TAKE ONE TABLET BY MOUTH AT BEDTIME  30 tablet  3  . spironolactone (ALDACTONE) 25 MG tablet TAKE ONE-HALF TABLET BY MOUTH EVERY DAY  45 tablet  4   No current facility-administered medications for this visit.    Allergies  Allergen Reactions  . Zetia [Ezetimibe] Other (See Comments)    Leg cramping, lips swelling, shortness of breath    Review of Systems negative except from HPI and PMH  Physical Exam BP 137/67  Pulse 60  Ht 5\' 7"  (1.702 m)  Wt  151 lb (68.493 kg)  BMI 23.64 kg/m2 Well developed and nourished in no acute distress HENT normal Neck supple with JVP-flat Clear The patient's device was interrogated.  The information was reviewed. No changes were made in the programming.   Regular rate and rhythm, no murmurs or gallops Abd-soft with active BS No Clubbing cyanosis edema Skin-warm and dry A & Oriented  Grossly normal sensory and motor function   ECG demonstrates P. synchronous pacing   Assessment and  Plan  Ischemic cardiomyopathy  Congestive heart failure-chronic-systolic  CRT-D-St. Jude  The patient's device was interrogated.  The information was reviewed. No changes were made in the  programming.     Sleep disturbance  The patient is doing well from a cardiac point of view is stable dyspnea and no chest discomfort.  His biggest complaint is that he can only sleep for hours. He wonders whether his related to his medications. We'll undertake a serial exclusion trial. Successive  months he will hold Ze Beta Aldactone  enalapril and simvastatin  We will check a metabolic profile today on his Aldactone.

## 2013-06-10 NOTE — Patient Instructions (Addendum)
Your physician recommends that you continue on your current medications as directed. Please refer to the Current Medication list given to you today.  Your physician recommends that you return for lab work today: BMET   Your physician recommends that you schedule a follow-up appointment in: 4 months with Dr. Caryl Comes.

## 2013-06-14 ENCOUNTER — Telehealth: Payer: Self-pay | Admitting: Internal Medicine

## 2013-06-14 NOTE — Telephone Encounter (Signed)
New message   Pt states that he has heart monitor that went off on 05/17/2013 and He states that he wasn't home, and that he was in [place]. He is requesting a call back.. Transferred to the Device clinic//SR

## 2013-06-14 NOTE — Telephone Encounter (Signed)
I explained to patient that the ICD captured his fast heart beats (VT), which did not have anything to do with the actual [employer] [facility] transmitter itself. Patient voiced understanding, but expressed how he did not have anyone to drive him around since he was told about his driving restriction. I explained to him that we could not lift that and an explaination was given for the reasons why he should not drive.

## 2013-06-15 ENCOUNTER — Other Ambulatory Visit: Payer: Self-pay | Admitting: Internal Medicine

## 2013-06-15 ENCOUNTER — Other Ambulatory Visit: Payer: Self-pay | Admitting: Cardiovascular Disease

## 2013-07-05 ENCOUNTER — Encounter: Payer: Self-pay | Admitting: Cardiovascular Disease

## 2013-07-05 ENCOUNTER — Ambulatory Visit (INDEPENDENT_AMBULATORY_CARE_PROVIDER_SITE_OTHER): Payer: Medicare Other | Admitting: Cardiovascular Disease

## 2013-07-05 VITALS — BP 122/74 | HR 70 | Ht 67.0 in | Wt 152.2 lb

## 2013-07-05 DIAGNOSIS — E785 Hyperlipidemia, unspecified: Secondary | ICD-10-CM

## 2013-07-05 DIAGNOSIS — I10 Essential (primary) hypertension: Secondary | ICD-10-CM

## 2013-07-05 DIAGNOSIS — I2589 Other forms of chronic ischemic heart disease: Secondary | ICD-10-CM

## 2013-07-05 DIAGNOSIS — I2510 Atherosclerotic heart disease of native coronary artery without angina pectoris: Secondary | ICD-10-CM

## 2013-07-05 DIAGNOSIS — I5022 Chronic systolic (congestive) heart failure: Secondary | ICD-10-CM

## 2013-07-05 DIAGNOSIS — I255 Ischemic cardiomyopathy: Secondary | ICD-10-CM

## 2013-07-05 LAB — HEPATIC FUNCTION PANEL
ALT: 17 U/L (ref 0–53)
AST: 21 U/L (ref 0–37)
Albumin: 4 g/dL (ref 3.5–5.2)
Alkaline Phosphatase: 36 U/L — ABNORMAL LOW (ref 39–117)
BILIRUBIN DIRECT: 0 mg/dL (ref 0.0–0.3)
TOTAL PROTEIN: 7.1 g/dL (ref 6.0–8.3)
Total Bilirubin: 0.4 mg/dL (ref 0.3–1.2)

## 2013-07-05 LAB — LIPID PANEL
CHOLESTEROL: 184 mg/dL (ref 0–200)
HDL: 38.5 mg/dL — AB (ref >39.00)
LDL CALC: 95 mg/dL (ref 0–99)
Total CHOL/HDL Ratio: 5
Triglycerides: 251 mg/dL — ABNORMAL HIGH (ref 0.0–149.0)
VLDL: 50.2 mg/dL — AB (ref 0.0–40.0)

## 2013-07-05 MED ORDER — NITROGLYCERIN 0.4 MG SL SUBL: 0.4000 mg | SUBLINGUAL_TABLET | SUBLINGUAL | Status: DC | PRN

## 2013-07-05 NOTE — Progress Notes (Signed)
History of Present Illness: 80 yo WM with history of CAD, ischemic CM s/p ICD, systolic CHF who has been followed in the past by Dr. Olevia Perches. He is here today for cardiac follow up. He had remote bypass surgery. He has an ischemic cardiomyopathy with an ejection fraction of 30% but improved to 35-40% following biventricular pacing. He had an ICD placed and in 2009 had an upgrade to a biventricular pacemaker. Because of symptoms of fatigue he underwent catheterization in July of 2011. He had total occlusion of the vein graft to the right coronary artery and total occlusion of the vein graft to the diagonal branch. Ejection fraction was 40%.     He is here today for follow up. He is doing well. No chest pain.  No dizziness, near syncope or syncope. He has some baseline dyspnea which is unchanged from before. No lower ext edema. Overall doing well.   Primary Care Physician: Dr. Maceo Pro   Last Lipid Profile:    Past Medical History  Diagnosis Date  . COPD (chronic obstructive pulmonary disease)   . CAD (coronary artery disease)     status post coronary bypass graft surgery in 1989 with documented occulsion of the vein graft to the diagonal branch of the left anterior descending in 2001.  . Ischemic cardiomyopathy     ejection fraction of 30% improved 35-40% 5/09  . Systolic congestive heart failure     Class III systolic congestive heart, improved to class II, euvolemic.  . Dual ICD (implantable cardiac defibrillator) in place     with recent upgrade to a biventricular defibrillator - St. Jude Promote [id number]  . Hyperlipidemia   . Hypertension     Past Surgical History  Procedure Laterality Date  . Cardiac assist device removal  12/15/03    Explantation of a previously implanted device, pocket revision and implantation of a new defibrillator with intraoperative defibrillation threshold testing. SURGEON: [name], M.D. 12/15/03.  Hartley Barefoot      contrast veinography and  implantation of dual-chamber defibrillator by Nikki Dom, M.D. 11/15/99  . Cardiac defibrillator placement      St. Jude Promote [id number]  . Coronary artery bypass graft  1989    Current Outpatient Prescriptions  Medication Sig Dispense Refill  . aspirin 81 MG tablet Take 81 mg by mouth daily.        . bisoprolol (ZEBETA) 5 MG tablet TAKE ONE TABLET BY MOUTH ONCE DAILY  30 tablet  0  . furosemide (LASIX) 40 MG tablet TAKE ONE TABLET BY MOUTH ONCE DAILY  30 tablet  0  . Multiple Vitamins-Minerals (MULTIVITAMIN WITH MINERALS) tablet Take 1 tablet by mouth daily.        . nitroGLYCERIN (NITROSTAT) 0.4 MG SL tablet Place 0.4 mg under the tongue every 5 (five) minutes as needed for chest pain.      . ramipril (ALTACE) 10 MG capsule TAKE ONE CAPSULE BY MOUTH TWICE DAILY  60 capsule  6  . simvastatin (ZOCOR) 40 MG tablet TAKE ONE TABLET BY MOUTH AT BEDTIME  30 tablet  0  . spironolactone (ALDACTONE) 25 MG tablet TAKE ONE-HALF TABLET BY MOUTH EVERY NIGHT       No current facility-administered medications for this visit.    Allergies  Allergen Reactions  . Pollen Extract   . Zetia [Ezetimibe] Other (See Comments)    Leg cramping, lips swelling, shortness of breath    History   Social History  . Marital Status:  Widowed    Spouse Name: N/A    Number of Children: 29  . Years of Education: N/A   Occupational History  .     Social History Main Topics  . Smoking status: Former Smoker -- 1.00 packs/day for 43 years    Types: Cigarettes    Quit date: 12/07/1987  . Smokeless tobacco: Not on file  . Alcohol Use: No  . Drug Use: No  . Sexual Activity: Not on file   Other Topics Concern  . Not on file   Social History Narrative  . No narrative on file    No family history on file.  Review of Systems:  As stated in the HPI and otherwise negative.   BP 122/74  Pulse 70  Ht 5\' 7"  (1.702 m)  Wt 152 lb 3.2 oz (69.037 kg)  BMI 23.83 kg/m2  Physical Examination: General:  Well developed, well nourished, NAD HEENT: OP clear, mucus membranes moist SKIN: warm, dry. No rashes. Neuro: No focal deficits Musculoskeletal: Muscle strength 5/5 all ext Psychiatric: Mood and affect normal Neck: No JVD, no carotid bruits, no thyromegaly, no lymphadenopathy. Lungs:Clear bilaterally, no wheezes, rhonci, crackles Cardiovascular: Regular rate and rhythm. No murmurs, gallops or rubs. Abdomen:Soft. Bowel sounds present. Non-tender.  Extremities: No lower extremity edema. Pulses are 2 + in the bilateral DP/PT.  Assessment and Plan:   1. CAD: Stable. He is known to have severe CAD with occlusion of the left main and proximal RCA with patent grafts to the OM/PLA and LAD. Occluded grafts to Diagonal and PDA by cath 2011. Not having active chest pain. Will continue conservative management with medical therapy for now.   2. Ischemic Cardiomyopathy: He is on good medications. Volume status is ok. Continue current meds. ICD followed by Dr. Caryl Comes.   3. HTN: BP controlled today. No changes in therapy.   4. HLD: Will repeat lipids and LFTs today. He is on a statin.   5. Chronic systolic CHF: No clinical signs of volume overload. Weight is stable. Continue current medication regimen.

## 2013-07-05 NOTE — Patient Instructions (Signed)
Your physician wants you to follow-up in:  12 months.  You will receive a reminder letter in the mail two months in advance. If you don't receive a letter, please call our office to schedule the follow-up appointment.   

## 2013-07-09 ENCOUNTER — Telehealth: Payer: Self-pay | Admitting: Cardiovascular Disease

## 2013-07-09 NOTE — Telephone Encounter (Signed)
New message ° ° ° ° °Want test results °

## 2013-07-09 NOTE — Telephone Encounter (Signed)
Left message to call back  

## 2013-07-14 NOTE — Telephone Encounter (Signed)
Spoke with pt and reviewed lipid and liver profile results with him.  

## 2013-07-14 NOTE — Telephone Encounter (Signed)
Follow up ° ° ° ° °Returned a nurses call °

## 2013-07-20 ENCOUNTER — Other Ambulatory Visit: Payer: Self-pay | Admitting: Cardiovascular Disease

## 2013-07-20 ENCOUNTER — Other Ambulatory Visit: Payer: Self-pay | Admitting: Internal Medicine

## 2013-10-08 ENCOUNTER — Encounter: Payer: Self-pay | Admitting: Internal Medicine

## 2013-10-08 ENCOUNTER — Ambulatory Visit (INDEPENDENT_AMBULATORY_CARE_PROVIDER_SITE_OTHER): Payer: Medicare Other | Admitting: Internal Medicine

## 2013-10-08 VITALS — BP 122/65 | HR 60 | Ht 67.0 in | Wt 150.0 lb

## 2013-10-08 DIAGNOSIS — Z95810 Presence of automatic (implantable) cardiac defibrillator: Secondary | ICD-10-CM

## 2013-10-08 DIAGNOSIS — I2589 Other forms of chronic ischemic heart disease: Secondary | ICD-10-CM

## 2013-10-08 DIAGNOSIS — I255 Ischemic cardiomyopathy: Secondary | ICD-10-CM

## 2013-10-08 DIAGNOSIS — I5022 Chronic systolic (congestive) heart failure: Secondary | ICD-10-CM

## 2013-10-08 LAB — MDC_IDC_ENUM_SESS_TYPE_INCLINIC
Battery Remaining Longevity: 0.1 mo
Brady Statistic RA Percent Paced: 48 %
Brady Statistic RV Percent Paced: 97 %
Date Time Interrogation Session: 20150717162057
HIGH POWER IMPEDANCE MEASURED VALUE: 52 Ohm
Lead Channel Impedance Value: 450 Ohm
Lead Channel Impedance Value: 575 Ohm
Lead Channel Pacing Threshold Amplitude: 0.5 V
Lead Channel Pacing Threshold Amplitude: 1 V
Lead Channel Pacing Threshold Amplitude: 1 V
Lead Channel Pacing Threshold Amplitude: 1.75 V
Lead Channel Pacing Threshold Pulse Width: 0.5 ms
Lead Channel Pacing Threshold Pulse Width: 0.5 ms
Lead Channel Pacing Threshold Pulse Width: 0.5 ms
Lead Channel Pacing Threshold Pulse Width: 1 ms
Lead Channel Pacing Threshold Pulse Width: 1 ms
Lead Channel Sensing Intrinsic Amplitude: 4.2 mV
Lead Channel Setting Pacing Amplitude: 2 V
Lead Channel Setting Pacing Amplitude: 2 V
Lead Channel Setting Pacing Amplitude: 3.5 V
Lead Channel Setting Pacing Pulse Width: 0.5 ms
Lead Channel Setting Sensing Sensitivity: 0.3 mV
MDC IDC MSMT BATTERY VOLTAGE: 2.45 V
MDC IDC MSMT LEADCHNL LV PACING THRESHOLD AMPLITUDE: 0.5 V
MDC IDC MSMT LEADCHNL LV PACING THRESHOLD PULSEWIDTH: 0.5 ms
MDC IDC MSMT LEADCHNL RA SENSING INTR AMPL: 2.3 mV
MDC IDC MSMT LEADCHNL RV IMPEDANCE VALUE: 362.5 Ohm
MDC IDC MSMT LEADCHNL RV PACING THRESHOLD AMPLITUDE: 1.75 V
MDC IDC PG SERIAL: 471058
MDC IDC SET LEADCHNL RV PACING PULSEWIDTH: 1 ms
Zone Setting Detection Interval: 280 ms
Zone Setting Detection Interval: 320 ms
Zone Setting Detection Interval: 375 ms

## 2013-10-08 LAB — BASIC METABOLIC PANEL
BUN: 20 mg/dL (ref 6–23)
CALCIUM: 9.9 mg/dL (ref 8.4–10.5)
CO2: 29 meq/L (ref 19–32)
CREATININE: 1.6 mg/dL — AB (ref 0.50–1.35)
Chloride: 98 mEq/L (ref 96–112)
GLUCOSE: 93 mg/dL (ref 70–99)
Potassium: 4.6 mEq/L (ref 3.5–5.3)
Sodium: 136 mEq/L (ref 135–145)

## 2013-10-08 LAB — MAGNESIUM: Magnesium: 2.4 mg/dL (ref 1.5–2.5)

## 2013-10-08 MED ORDER — RAMIPRIL 10 MG PO CAPS: 10.0000 mg | ORAL_CAPSULE | Freq: Every day | ORAL | Status: DC

## 2013-10-08 NOTE — Patient Instructions (Addendum)
Your physician has recommended you make the following change in your medication:  1) DECREASE  Ramipril to 10 mg daily  Labs today: BMET, Magnesium  (it depends on magnesium level as to whether or not we will start a magnesium supplement)   Remote monitoring is used to monitor your Pacemaker of ICD from home. This monitoring reduces the number of office visits required to check your device to one time per year. It allows [place] to keep an eye on the functioning of your device to ensure it is working properly. You are scheduled for a device check from home on 01/10/14. You may send your transmission at any time that day. If you have a wireless device, the transmission will be sent automatically. After your physician reviews your transmission, you will receive a postcard with your next transmission date.  Your physician wants you to follow-up in: 1 year with Dr. Caryl Comes.  You will receive a reminder letter in the mail two months in advance. If you don't receive a letter, please call our office to schedule the follow-up appointment.

## 2013-10-08 NOTE — Progress Notes (Signed)
Patient Care Team: Abigail Miyamoto, MD as PCP - General (Family Medicine)   HPI  [name] [name] is a 80 y.o. male seen in followup for Ischemic cardiomyopathy congestive heart failure for which he is s/p CRT-D implantation. He is s/p CABG and most recent EF was about 30-35% in fall of 2009.   He has stable low amplitude R waves  He says his breathing is much better since his AV optimization echo; he is hoping that there is greater benefit to be gained. He's not having chest pain or shortness of breath or edema  He is now married about a 3 years.  He had intercurrent ventricular tachycardia treated with antitachycardia pacing the patient was unaware of this.  The patient denies chest pain,  nocturnal dyspnea, orthopnea or peripheral edema.  There have been no palpitations, lightheadedness or syncope. He has chronic shortness of breath  There is a sleep disturbance issue that prompted a drug exclusion   trial   He thinksramapril was the culprit and he resumed it at  Once daily and he is sleeping better  He ahs having nocturnal leg cramps Last labs 3/15 showed K 4.9  No mag    Past Surgical History  Procedure Laterality Date  . Cardiac assist device removal  12/15/03    Explantation of a previously implanted device, pocket revision and implantation of a new defibrillator with intraoperative defibrillation threshold testing. SURGEON: [name], M.D. 12/15/03.  Hartley Barefoot      contrast veinography and implantation of dual-chamber defibrillator by Nikki Dom, M.D. 11/15/99  . Cardiac defibrillator placement      St. Jude Promote [id number]  . Coronary artery bypass graft  1989    Current Outpatient Prescriptions  Medication Sig Dispense Refill  . aspirin 81 MG tablet Take 81 mg by mouth daily.        . bisoprolol (ZEBETA) 5 MG tablet TAKE ONE TABLET BY MOUTH ONCE DAILY  30 tablet  2  . furosemide (LASIX) 40 MG tablet TAKE ONE TABLET BY MOUTH ONCE DAILY  30  tablet  5  . Multiple Vitamins-Minerals (MULTIVITAMIN WITH MINERALS) tablet Take 1 tablet by mouth daily.        . nitroGLYCERIN (NITROSTAT) 0.4 MG SL tablet Place 1 tablet (0.4 mg total) under the tongue every 5 (five) minutes as needed for chest pain.  25 tablet  6  . ramipril (ALTACE) 10 MG capsule TAKE ONE CAPSULE BY MOUTH TWICE DAILY  60 capsule  6  . simvastatin (ZOCOR) 40 MG tablet TAKE ONE TABLET BY MOUTH AT BEDTIME  30 tablet  5  . spironolactone (ALDACTONE) 25 MG tablet TAKE ONE-HALF TABLET BY MOUTH EVERY NIGHT       No current facility-administered medications for this visit.    Allergies  Allergen Reactions  . Pollen Extract   . Zetia [Ezetimibe] Other (See Comments)    Leg cramping, lips swelling, shortness of breath    Review of Systems negative except from HPI and PMH  Physical Exam BP 122/65  Pulse 60  Ht 5\' 7"  (1.702 m)  Wt 150 lb (68.04 kg)  BMI 23.49 kg/m2 Well developed and nourished in no acute distress HENT normal Neck supple with JVP-flat Clear The patient's device was interrogated.  The information was reviewed. No changes were made in the programming.   Regular rate and rhythm, no murmurs or gallops Abd-soft with active BS No Clubbing cyanosis edema Skin-warm and dry A &  Oriented  Grossly normal sensory and motor function   ECG demonstrates P. synchronous pacing   Assessment and  Plan  Ischemic cardiomyopathy  Congestive heart failure-chronic-systolic  CRT-D-St. Jude  The patient's device was interrogated.  The information was reviewed. No changes were made in the programming.     Sleep disturbance  The patient is doing well from a cardiac point of view is stable dyspnea and no chest discomfort.   We will check a metabolic profile today on his Aldactone as well as a mag level.   He was continue ramapril 10 mg daily as this seems to be better for his sleep   His device is approaching ERI. At this juncture he would anticipate CRT D.  reimplantation. Given his small stature on anticipate changing his St. Jude device for Medtronic device

## 2013-10-13 ENCOUNTER — Telehealth: Payer: Self-pay | Admitting: Internal Medicine

## 2013-10-13 NOTE — Telephone Encounter (Signed)
Spoke with pt on 10-12-2013 and scheudled appt with MD due to ERI with ICD. Pt wife called again today saying that the alert vibrated again this morning. I informed pt wife that the device would continue to work in the meantime and that when pt seen MD 11-17-13 they would discuss the generator change and set up the procedure. Pt wanted primary cardiologist to know as well that device has reached ERI. Will route note to primary cardiologist as a FYI.

## 2013-10-13 NOTE — Telephone Encounter (Signed)
thanks

## 2013-10-13 NOTE — Telephone Encounter (Signed)
New message     Patient wife calling     C/O de fib beeping x 3 times.

## 2013-10-27 ENCOUNTER — Other Ambulatory Visit: Payer: Self-pay | Admitting: Internal Medicine

## 2013-11-01 ENCOUNTER — Telehealth: Payer: Self-pay | Admitting: Internal Medicine

## 2013-11-01 NOTE — Telephone Encounter (Signed)
Contacted pt to inform him of his recent lab results.  Informed pt that per Dr Caryl Comes, his lab results were all normal.  Pt verbalized understanding and pleased with this news.

## 2013-11-01 NOTE — Telephone Encounter (Signed)
New message     Returning a nurses call from friday

## 2013-11-17 ENCOUNTER — Ambulatory Visit (INDEPENDENT_AMBULATORY_CARE_PROVIDER_SITE_OTHER): Payer: Medicare Other | Admitting: Internal Medicine

## 2013-11-17 ENCOUNTER — Encounter: Payer: Self-pay | Admitting: *Deleted

## 2013-11-17 ENCOUNTER — Encounter: Payer: Self-pay | Admitting: Internal Medicine

## 2013-11-17 VITALS — BP 110/42 | HR 59 | Ht 67.0 in | Wt 149.8 lb

## 2013-11-17 DIAGNOSIS — I2589 Other forms of chronic ischemic heart disease: Secondary | ICD-10-CM

## 2013-11-17 DIAGNOSIS — I255 Ischemic cardiomyopathy: Secondary | ICD-10-CM

## 2013-11-17 DIAGNOSIS — I5022 Chronic systolic (congestive) heart failure: Secondary | ICD-10-CM

## 2013-11-17 DIAGNOSIS — Z95810 Presence of automatic (implantable) cardiac defibrillator: Secondary | ICD-10-CM

## 2013-11-17 NOTE — Patient Instructions (Addendum)
Your physician recommends that you continue on your current medications as directed. Please refer to the Current Medication list given to you today.  Your physician has requested that you have a lexiscan myoview. For further information please visit [email or web address]. Please follow instruction sheet, as given.  Your physician has recommended that you have a defibrillator generator change.  Ezell Poke, RN will call you to arrange this procedure.

## 2013-11-17 NOTE — Progress Notes (Signed)
Patient Care Team: Abigail Miyamoto, MD as PCP - General (Family Medicine)   HPI  [name] [name] is a 80 y.o. male seen in followup for Ischemic cardiomyopathy congestive heart failure for which he is s/p CRT-D implantation. He is s/p CABG and most recent EF was about 30-35% in fall of 2009.   He has stable low amplitude R waves  He says his breathing is much better since his AV optimization echo; he is hoping that there is greater benefit to be gained. He's not having chest pain or shortness of breath or edema  He is now married about a 3 years.  He underwent catheterization in July of 2011. He had total occlusion of the vein graft to the right coronary artery and total occlusion of the vein graft to the diagonal branch. Ejection fraction was 40%.  SR  He had intercurrent ventricular tachycardia treated with antitachycardia pacing; the patient was unaware of this.  The patient denies chest pain,  nocturnal dyspnea, orthopnea or peripheral edema.  There have been no palpitations, lightheadedness or syncope. He has chronic shortness of breath  There is a sleep disturbance issue that prompted a drug exclusion   trial   He thinksramapril was the culprit and he resumed it at  Once daily and he is sleeping better  He ahs having nocturnal leg cramps Last labs 3/15 showed K 4.9  No mag    Past Surgical History  Procedure Laterality Date  . Cardiac assist device removal  12/15/03    Explantation of a previously implanted device, pocket revision and implantation of a new defibrillator with intraoperative defibrillation threshold testing. SURGEON: [name], M.D. 12/15/03.  [name] [name]      contrast veinography and implantation of dual-chamber defibrillator by Nikki Dom, M.D. 11/15/99  . Cardiac defibrillator placement      St. Jude Promote [id number]  . Coronary artery bypass graft  1989    Current Outpatient Prescriptions  Medication Sig Dispense Refill  .  aspirin 81 MG tablet Take 81 mg by mouth daily.        . bisoprolol (ZEBETA) 5 MG tablet TAKE ONE TABLET BY MOUTH ONCE DAILY  30 tablet  5  . furosemide (LASIX) 40 MG tablet TAKE ONE TABLET BY MOUTH ONCE DAILY  30 tablet  5  . Multiple Vitamins-Minerals (MULTIVITAMIN WITH MINERALS) tablet Take 1 tablet by mouth daily.        . nitroGLYCERIN (NITROSTAT) 0.4 MG SL tablet Place 1 tablet (0.4 mg total) under the tongue every 5 (five) minutes as needed for chest pain.  25 tablet  6  . ramipril (ALTACE) 10 MG capsule Take 1 capsule (10 mg total) by mouth daily.  30 capsule  6  . simvastatin (ZOCOR) 40 MG tablet TAKE ONE TABLET BY MOUTH AT BEDTIME  30 tablet  5  . spironolactone (ALDACTONE) 25 MG tablet TAKE ONE-HALF TABLET BY MOUTH EVERY NIGHT       No current facility-administered medications for this visit.    Allergies  Allergen Reactions  . Pollen Extract   . Zetia [Ezetimibe] Other (See Comments)    Leg cramping, lips swelling, shortness of breath    Review of Systems negative except from HPI and PMH  Physical Exam BP 110/42  Pulse 59  Ht 5\' 7"  (1.702 m)  Wt 149 lb 12.8 oz (67.949 kg)  BMI 23.46 kg/m2 Well developed and nourished in no acute distress HENT normal Neck supple  with JVP-flat Clear The patient's device was interrogated.  The information was reviewed. No changes were made in the programming.   Regular rate and rhythm, no murmurs or gallops Abd-soft with active BS No Clubbing cyanosis edema Skin-warm and dry A & Oriented  Grossly normal sensory and motor function   ECG demonstrates P. synchronous pacing   Assessment and  Plan  Ischemic cardiomyopathy  Congestive heart failure-chronic-systolic  CRT-D-St. Jude  The patient's device was interrogated.  The information was reviewed. No changes were made in the programming.     Sleep disturbance   His device has reached ERI.  We'll undertake a stress scan in anticipation as it has been since 2011 since he  had a catheterization  He struggles with shortness of breath and will have to see whether there is any thing that we can do with device reprogramming.  We have reviewed the benefits and risks of generator replacement.  These include but are not limited to lead fracture and infection.  The patient understands, agrees and is willing to proceed.   And will anticipate the use of any his pouch as well as the device being placed in a submuscular pocket

## 2013-11-19 ENCOUNTER — Encounter: Payer: Self-pay | Admitting: Internal Medicine

## 2013-11-19 ENCOUNTER — Telehealth: Payer: Self-pay | Admitting: *Deleted

## 2013-11-19 NOTE — Telephone Encounter (Signed)
Pt's spouse concerned ERI alert tone continues to notify them. I offered to create an appt to turn off the alert tone but spouse declined. I let her know the alert tone will continue to notify them for potentially two more weeks unless we program it off. Pt said they would deal with it. Stated they were more concerned it was indicative of a worse problem. I offered for them to come in next week if they change their mind over the wknd. I also told spouse the tone will continue to alert them through the night causing sleep disruption. Pt expressed gratitude and suggested having the tone turned off during the stress test appt on 9/8.

## 2013-11-24 ENCOUNTER — Encounter: Payer: Self-pay | Admitting: Internal Medicine

## 2013-11-26 ENCOUNTER — Other Ambulatory Visit: Payer: Self-pay | Admitting: *Deleted

## 2013-11-26 ENCOUNTER — Encounter: Payer: Self-pay | Admitting: *Deleted

## 2013-11-26 ENCOUNTER — Telehealth: Payer: Self-pay | Admitting: *Deleted

## 2013-11-26 DIAGNOSIS — Z01812 Encounter for preprocedural laboratory examination: Secondary | ICD-10-CM

## 2013-11-26 DIAGNOSIS — I255 Ischemic cardiomyopathy: Secondary | ICD-10-CM

## 2013-11-26 NOTE — Telephone Encounter (Signed)
Scheduled BiV generator change for 12/13/13. Pre procedure labs 12/06/13. Wound check 10/01. Letter of instructions reviewed with patient and left at front desk for patient to pick up when he comes for stress testing on 9/8. Patient verbalized understanding and agreeable to plan.

## 2013-11-30 ENCOUNTER — Ambulatory Visit (HOSPITAL_COMMUNITY): Payer: Medicare Other | Attending: Internal Medicine | Admitting: Radiology

## 2013-11-30 VITALS — BP 117/75 | Ht 67.0 in | Wt 146.0 lb

## 2013-11-30 DIAGNOSIS — E785 Hyperlipidemia, unspecified: Secondary | ICD-10-CM | POA: Diagnosis not present

## 2013-11-30 DIAGNOSIS — I2589 Other forms of chronic ischemic heart disease: Secondary | ICD-10-CM | POA: Insufficient documentation

## 2013-11-30 DIAGNOSIS — I5022 Chronic systolic (congestive) heart failure: Secondary | ICD-10-CM | POA: Insufficient documentation

## 2013-11-30 DIAGNOSIS — R0602 Shortness of breath: Secondary | ICD-10-CM | POA: Diagnosis not present

## 2013-11-30 DIAGNOSIS — J449 Chronic obstructive pulmonary disease, unspecified: Secondary | ICD-10-CM | POA: Diagnosis not present

## 2013-11-30 DIAGNOSIS — Z95810 Presence of automatic (implantable) cardiac defibrillator: Secondary | ICD-10-CM | POA: Insufficient documentation

## 2013-11-30 DIAGNOSIS — I10 Essential (primary) hypertension: Secondary | ICD-10-CM | POA: Insufficient documentation

## 2013-11-30 DIAGNOSIS — I255 Ischemic cardiomyopathy: Secondary | ICD-10-CM

## 2013-11-30 DIAGNOSIS — I2510 Atherosclerotic heart disease of native coronary artery without angina pectoris: Secondary | ICD-10-CM | POA: Insufficient documentation

## 2013-11-30 DIAGNOSIS — R002 Palpitations: Secondary | ICD-10-CM | POA: Insufficient documentation

## 2013-11-30 DIAGNOSIS — I252 Old myocardial infarction: Secondary | ICD-10-CM | POA: Diagnosis not present

## 2013-11-30 DIAGNOSIS — I4949 Other premature depolarization: Secondary | ICD-10-CM

## 2013-11-30 DIAGNOSIS — I509 Heart failure, unspecified: Secondary | ICD-10-CM | POA: Diagnosis not present

## 2013-11-30 DIAGNOSIS — J4489 Other specified chronic obstructive pulmonary disease: Secondary | ICD-10-CM | POA: Insufficient documentation

## 2013-11-30 MED ORDER — REGADENOSON 0.4 MG/5ML IV SOLN
0.4000 mg | Freq: Once | INTRAVENOUS | Status: AC
  Administered 2013-11-30: 0.4 mg via INTRAVENOUS

## 2013-11-30 MED ORDER — TECHNETIUM TC 99M SESTAMIBI GENERIC - CARDIOLITE
33.0000 | Freq: Once | INTRAVENOUS | Status: AC | PRN
  Administered 2013-11-30: 33 via INTRAVENOUS

## 2013-11-30 MED ORDER — TECHNETIUM TC 99M SESTAMIBI GENERIC - CARDIOLITE
11.0000 | Freq: Once | INTRAVENOUS | Status: AC | PRN
  Administered 2013-11-30: 11 via INTRAVENOUS

## 2013-11-30 NOTE — Progress Notes (Signed)
[facility] [address] [place], [place] [address] [phone]    Cardiology Nuclear Med Study  [name] [name] is a 80 y.o. male     MRN : [id number]     DOB: 28-May-1930  Procedure Date: 11/30/2013  Nuclear Med Background Indication for Stress Test:  Evaluation for Ischemia, Surgical Clearance:ICD change by Dr. Caryl Comes and Follow up CAD History:  COPD and CAD, '09 MPI: Scar EF: 27% AICD, PTVP Cardiac Risk Factors: Hypertension and Lipids  Symptoms:  Palpitations and SOB   Nuclear Pre-Procedure Caffeine/Decaff Intake:  None NPO After: 7:00am   Lungs:  clear O2 Sat: 95% on room air. IV 0.9% NS with Angio Cath:  22g  IV Site: R Wrist  IV Started by:  Matilde Haymaker, RN  Chest Size (in):  38 Cup Size: n/a  Height: 5\' 7"  (1.702 m)  Weight:  146 lb (66.225 kg)  BMI:  Body mass index is 22.86 kg/(m^2). Tech Comments:  No Zebeta x 24 hrs    Nuclear Med Study 1 or 2 day study: 1 day  Stress Test Type:  Lexiscan  Reading MD: n/a  Order Authorizing Provider:  Herbert Pun  Resting Radionuclide: Technetium 62m Sestamibi  Resting Radionuclide Dose: 11.0 mCi   Stress Radionuclide:  Technetium 62m Sestamibi  Stress Radionuclide Dose: 33.0 mCi           Stress Protocol Rest HR: 60 Stress HR: 88  Rest BP: 117/75 Stress BP: 120/78  Exercise Time (min): n/a METS: n/a   Predicted Max HR: 139 bpm % Max HR: 63.31 bpm Rate Pressure Product: 10560   Dose of Adenosine (mg):  n/a Dose of Lexiscan: 0.4 mg  Dose of Atropine (mg): n/a Dose of Dobutamine: n/a mcg/kg/min (at max HR)  Stress Test Technologist: Perrin Maltese, EMT-P  Nuclear Technologist:  Annye Rusk, CNMT     Rest Procedure:  Myocardial perfusion imaging was performed at rest 45 minutes following the intravenous administration of Technetium 42m Sestamibi. Rest ECG: Sinus rhythm rate 67 with ventricular pacing, appears biventricular  Stress Procedure:  The patient received IV Lexiscan 0.4  mg over 15-seconds.  Technetium 39m Sestamibi injected at 30-seconds. This patient had a weird feeling and chest pressure with the Lexiscan injection.  Quantitative spect images were obtained after a 45 minute delay. Stress ECG: No significant ST segment change suggestive of ischemia.  QPS Raw Data Images:  Normal; no motion artifact; normal heart/lung ratio. Stress Images:  There is a base to mid inferolateral scar consistent with old infarct. There is mild peri-infarct ischemia mostly noted in the basilar inferior portion. Otherwise, homogeneous radiotracer uptake. Rest Images:  As described above. Basal inferior infarct pattern. Moderate-sized. Subtraction (SDS):  2 Transient Ischemic Dilatation (Normal <1.22):  0.88 Lung/Heart Ratio (Normal <0.45):  0.26  Quantitative Gated Spect Images QGS EDV:  98 ml QGS ESV:  56 ml  Impression Exercise Capacity:  Lexiscan with no exercise. BP Response:  Normal blood pressure response. Clinical Symptoms:  Typical symptoms with infusion ECG Impression:  No ischemic changes, occasional PVCs Comparison with Prior Nuclear Study: No images to compare  Overall Impression:  Intermediate risk stress nuclear study with old inferior infarct pattern from base to mid noted with associated mild peri-infarct ischemia.  LV Ejection Fraction: 43%.  LV Wall Motion:  Mildly asynchronous contraction, inferior wall demonstrates minimal hypokinesis  Candee Furbish, MD

## 2013-12-06 ENCOUNTER — Other Ambulatory Visit (INDEPENDENT_AMBULATORY_CARE_PROVIDER_SITE_OTHER): Payer: Medicare Other

## 2013-12-06 DIAGNOSIS — Z01812 Encounter for preprocedural laboratory examination: Secondary | ICD-10-CM

## 2013-12-06 DIAGNOSIS — I2589 Other forms of chronic ischemic heart disease: Secondary | ICD-10-CM

## 2013-12-06 DIAGNOSIS — I255 Ischemic cardiomyopathy: Secondary | ICD-10-CM

## 2013-12-06 LAB — CBC WITH DIFFERENTIAL/PLATELET
BASOS PCT: 0.4 % (ref 0.0–3.0)
Basophils Absolute: 0 10*3/uL (ref 0.0–0.1)
Eosinophils Absolute: 0.4 10*3/uL (ref 0.0–0.7)
Eosinophils Relative: 3.8 % (ref 0.0–5.0)
HCT: 41.9 % (ref 39.0–52.0)
Hemoglobin: 14.1 g/dL (ref 13.0–17.0)
Lymphocytes Relative: 24.2 % (ref 12.0–46.0)
Lymphs Abs: 2.3 10*3/uL (ref 0.7–4.0)
MCHC: 33.7 g/dL (ref 30.0–36.0)
MCV: 91 fl (ref 78.0–100.0)
Monocytes Absolute: 0.6 10*3/uL (ref 0.1–1.0)
Monocytes Relative: 6.5 % (ref 3.0–12.0)
NEUTROS PCT: 65.1 % (ref 43.0–77.0)
Neutro Abs: 6.3 10*3/uL (ref 1.4–7.7)
PLATELETS: 164 10*3/uL (ref 150.0–400.0)
RBC: 4.6 Mil/uL (ref 4.22–5.81)
RDW: 13.6 % (ref 11.5–15.5)
WBC: 9.7 10*3/uL (ref 4.0–10.5)

## 2013-12-06 LAB — BASIC METABOLIC PANEL
BUN: 24 mg/dL — AB (ref 6–23)
CHLORIDE: 103 meq/L (ref 96–112)
CO2: 26 meq/L (ref 19–32)
Calcium: 9.2 mg/dL (ref 8.4–10.5)
Creatinine, Ser: 1.5 mg/dL (ref 0.4–1.5)
GFR: 49.57 mL/min — ABNORMAL LOW (ref >60.00)
Glucose, Bld: 102 mg/dL — ABNORMAL HIGH (ref 70–99)
Potassium: 4.3 mEq/L (ref 3.5–5.1)
Sodium: 138 mEq/L (ref 135–145)

## 2013-12-07 ENCOUNTER — Encounter: Payer: Self-pay | Admitting: Internal Medicine

## 2013-12-07 NOTE — Telephone Encounter (Signed)
Follow up:      Per pt's wife pt would like a call back about his test results please.

## 2013-12-07 NOTE — Telephone Encounter (Signed)
This encounter was created in error - please disregard.

## 2013-12-12 DIAGNOSIS — I2589 Other forms of chronic ischemic heart disease: Secondary | ICD-10-CM | POA: Diagnosis not present

## 2013-12-12 DIAGNOSIS — G479 Sleep disorder, unspecified: Secondary | ICD-10-CM | POA: Diagnosis not present

## 2013-12-12 DIAGNOSIS — I509 Heart failure, unspecified: Secondary | ICD-10-CM | POA: Diagnosis not present

## 2013-12-12 DIAGNOSIS — Z7982 Long term (current) use of aspirin: Secondary | ICD-10-CM | POA: Diagnosis not present

## 2013-12-12 DIAGNOSIS — Z4502 Encounter for adjustment and management of automatic implantable cardiac defibrillator: Secondary | ICD-10-CM | POA: Diagnosis present

## 2013-12-12 DIAGNOSIS — I25810 Atherosclerosis of coronary artery bypass graft(s) without angina pectoris: Secondary | ICD-10-CM | POA: Diagnosis not present

## 2013-12-12 DIAGNOSIS — I5022 Chronic systolic (congestive) heart failure: Secondary | ICD-10-CM | POA: Diagnosis not present

## 2013-12-12 MED ORDER — SODIUM CHLORIDE 0.9 % IR SOLN
80.0000 mg | Status: DC
  Filled 2013-12-12: qty 2

## 2013-12-12 MED ORDER — CEFAZOLIN SODIUM-DEXTROSE 2-3 GM-% IV SOLR: 2.0000 g | INTRAVENOUS | Status: DC

## 2013-12-13 ENCOUNTER — Encounter (HOSPITAL_COMMUNITY)
Admission: RE | Disposition: A | Discharge status: Home / Self Care | Payer: Self-pay | Source: Ambulatory Visit | Attending: Internal Medicine

## 2013-12-13 ENCOUNTER — Ambulatory Visit (HOSPITAL_COMMUNITY)
Admission: RE | Admit: 2013-12-13 | Discharge: 2013-12-13 | Disposition: A | Discharge status: Home / Self Care | Payer: Medicare Other | Source: Ambulatory Visit | Attending: Internal Medicine | Admitting: Internal Medicine

## 2013-12-13 DIAGNOSIS — I5022 Chronic systolic (congestive) heart failure: Secondary | ICD-10-CM

## 2013-12-13 DIAGNOSIS — I255 Ischemic cardiomyopathy: Secondary | ICD-10-CM

## 2013-12-13 DIAGNOSIS — Z95810 Presence of automatic (implantable) cardiac defibrillator: Secondary | ICD-10-CM

## 2013-12-13 DIAGNOSIS — I509 Heart failure, unspecified: Secondary | ICD-10-CM | POA: Diagnosis not present

## 2013-12-13 DIAGNOSIS — I472 Ventricular tachycardia: Secondary | ICD-10-CM

## 2013-12-13 DIAGNOSIS — Z4502 Encounter for adjustment and management of automatic implantable cardiac defibrillator: Secondary | ICD-10-CM | POA: Diagnosis not present

## 2013-12-13 DIAGNOSIS — Z7982 Long term (current) use of aspirin: Secondary | ICD-10-CM | POA: Insufficient documentation

## 2013-12-13 DIAGNOSIS — I25810 Atherosclerosis of coronary artery bypass graft(s) without angina pectoris: Secondary | ICD-10-CM | POA: Insufficient documentation

## 2013-12-13 DIAGNOSIS — G479 Sleep disorder, unspecified: Secondary | ICD-10-CM | POA: Insufficient documentation

## 2013-12-13 DIAGNOSIS — I2589 Other forms of chronic ischemic heart disease: Secondary | ICD-10-CM | POA: Insufficient documentation

## 2013-12-13 DIAGNOSIS — I4729 Other ventricular tachycardia: Secondary | ICD-10-CM

## 2013-12-13 HISTORY — PX: BI-VENTRICULAR IMPLANTABLE CARDIOVERTER DEFIBRILLATOR UPGRADE: SHX5461

## 2013-12-13 LAB — SURGICAL PCR SCREEN
MRSA, PCR: NEGATIVE
STAPHYLOCOCCUS AUREUS: NEGATIVE

## 2013-12-13 SURGERY — BI-VENTRICULAR IMPLANTABLE CARDIOVERTER DEFIBRILLATOR UPGRADE
Anesthesia: LOCAL

## 2013-12-13 MED ORDER — MIDAZOLAM HCL 5 MG/5ML IJ SOLN
INTRAMUSCULAR | Status: AC
  Filled 2013-12-13: qty 5

## 2013-12-13 MED ORDER — MUPIROCIN 2 % EX OINT
TOPICAL_OINTMENT | CUTANEOUS | Status: AC
  Administered 2013-12-13: 1 via TOPICAL
  Filled 2013-12-13: qty 22

## 2013-12-13 MED ORDER — MUPIROCIN 2 % EX OINT
1.0000 "application " | TOPICAL_OINTMENT | Freq: Once | CUTANEOUS | Status: AC
  Administered 2013-12-13: 1 via TOPICAL

## 2013-12-13 MED ORDER — SODIUM CHLORIDE 0.9 % IV SOLN: INTRAVENOUS | Status: DC

## 2013-12-13 MED ORDER — ACETAMINOPHEN 325 MG PO TABS
325.0000 mg | ORAL_TABLET | ORAL | Status: DC | PRN
  Filled 2013-12-13: qty 2

## 2013-12-13 MED ORDER — FENTANYL CITRATE 0.05 MG/ML IJ SOLN
INTRAMUSCULAR | Status: AC
  Filled 2013-12-13: qty 2

## 2013-12-13 MED ORDER — LIDOCAINE HCL (PF) 1 % IJ SOLN
INTRAMUSCULAR | Status: AC
  Filled 2013-12-13: qty 60

## 2013-12-13 MED ORDER — ONDANSETRON HCL 4 MG/2ML IJ SOLN: 4.0000 mg | Freq: Four times a day (QID) | INTRAMUSCULAR | Status: DC | PRN

## 2013-12-13 MED ORDER — CEFAZOLIN SODIUM-DEXTROSE 2-3 GM-% IV SOLR
INTRAVENOUS | Status: AC
  Filled 2013-12-13: qty 50

## 2013-12-13 MED ORDER — SODIUM CHLORIDE 0.9 % IV SOLN
INTRAVENOUS | Status: DC
  Administered 2013-12-13: 06:00:00 via INTRAVENOUS

## 2013-12-13 MED ORDER — CHLORHEXIDINE GLUCONATE 4 % EX LIQD: 60.0000 mL | Freq: Once | CUTANEOUS | Status: DC

## 2013-12-13 NOTE — Discharge Instructions (Signed)
Pacemaker Battery Change, Care After °Refer to this sheet in the next few weeks. These instructions provide you with information on caring for yourself after your procedure. Your health care provider may also give you more specific instructions. Your treatment has been planned according to current medical practices, but problems sometimes occur. Call your health care provider if you have any problems or questions after your procedure. °WHAT TO EXPECT AFTER THE PROCEDURE °After your procedure, it is typical to have the following sensations: °· Soreness at the pacemaker site. °HOME CARE INSTRUCTIONS  °· Keep the incision clean and dry. °· Unless advised otherwise, you may shower beginning 48 hours after your procedure. °· For the first week after the replacement, avoid stretching motions that pull at the incision site, and avoid heavy exercise with the arm that is on the same side as the incision. °· Take medicines only as directed by your health care provider. °· Keep all follow-up visits as directed by your health care provider. °SEEK MEDICAL CARE IF:  °· You have pain at the incision site that is not relieved by over-the-counter or prescription medicine. °· There is drainage or pus from the incision site. °· There is swelling larger than a lime at the incision site. °· You develop red streaking that extends above or below the incision site. °· You feel brief, intermittent palpitations, light-headedness, or any symptoms that you feel might be related to your heart. °SEEK IMMEDIATE MEDICAL CARE IF:  °· You experience chest pain that is different than the pain at the pacemaker site. °· You experience shortness of breath. °· You have palpitations or irregular heartbeat. °· You have light-headedness that does not go away quickly. °· You faint. °· You have pain that gets worse and is not relieved by medicine. °Document Released: 12/30/2012 Document Revised: 07/26/2013 Document Reviewed: 12/30/2012 °Exit[employer]® Patient  Information ©2015 [employer]. This information is not intended to replace advice given to you by your health care provider. Make sure you discuss any questions you have with your health care provider. ° °

## 2013-12-13 NOTE — CV Procedure (Signed)
Preoperative diagnosis ischemic cardiomyopathy prev ICD with Rx VT Postoperative diagnosis same/   Procedure: Generator replacement; Assessment of HV leads  And revision of pocket to submuscular space;   Following informed consent the patient was brought to the electrophysiology laboratory in place of the fluoroscopic table in the supine position after routine prep and drape lidocaine was infiltrated in the region of the previous incision and carried down to later the device pocket using sharp dissection and electrocautery. The pocket was opened the device was freed up and was explanted.  Interrogation of the previously implanted ICD ventricular lead St Jude rate sense  demonstrated an R wave of 3.8  millivolts., and impedance of 427 ohms, and a pacing threshold of 2.2 volts at 0.5 msec.     Interrogation of the previously implanted Left ventricular lead St Jude   demonstrated an R wave of 21.7  millivolts., and impedance of 1197 ohms, and a pacing threshold of 1.5 volts at 0.5 msec.    The previously implanted atrial lead Medtronic  demonstrated a P-wave amplitude of  2.6 milllivolts and impedance of  736 ohms, and a pacing threshold of  0.7 volts at @ 0.47milliseconds.  The leads were inspected. Repair was not  Needed.  Because of significant subcutaneous atrophy, elected to create a submuscular pocket.   The leads were then attached to a Medtronic  pulse generator, serial number [id number].    Through the device the P-wave amplitude  Was  1.6 milllivolts and impedance of  437 ohms, and a pacing threshold of  0.5 volts at @ 0.77milliseconds; the ICD ventricular lead demonstrated an R wave of  3.4  millivolts., and impedance of 380 ohms, and a pacing threshold of  2.0 volts at 1.0 msec and the  Left ventricular lead demonstrated an  impedance of  532 ohms, and a pacing threshold of  0.5 volts at 0.4 msec.    High voltage impedances were 67/49 ohms An aegis antimicrobial pouch was utilized with  half the material placed on the anterior aspect of the submuscular pocket adjacent to the device and the other half in the abandoned subcutaneous pocket  The device was secured tot he prepectoral pocket  The pocket was irrigated with antibiotic containing saline solution hemostasis was assured and the leads and the device were placed in the pocket. The wound was then closed in 2 layers in normal fashion.  The patient tolerated the procedure without apparent complication.   DFT testing was not  performed  EBL Minimal    Virl Axe   \

## 2013-12-13 NOTE — H&P (View-Only) (Signed)
Patient Care Team: Abigail Miyamoto, MD as PCP - General (Family Medicine)   HPI  [name] [name] is a 80 y.o. male seen in followup for Ischemic cardiomyopathy congestive heart failure for which he is s/p CRT-D implantation. He is s/p CABG and most recent EF was about 30-35% in fall of 2009.   He has stable low amplitude R waves  He says his breathing is much better since his AV optimization echo; he is hoping that there is greater benefit to be gained. He's not having chest pain or shortness of breath or edema  He is now married about a 3 years.  He underwent catheterization in July of 2011. He had total occlusion of the vein graft to the right coronary artery and total occlusion of the vein graft to the diagonal branch. Ejection fraction was 40%.  SR  He had intercurrent ventricular tachycardia treated with antitachycardia pacing; the patient was unaware of this.  The patient denies chest pain,  nocturnal dyspnea, orthopnea or peripheral edema.  There have been no palpitations, lightheadedness or syncope. He has chronic shortness of breath  There is a sleep disturbance issue that prompted a drug exclusion   trial   He thinksramapril was the culprit and he resumed it at  Once daily and he is sleeping better  He ahs having nocturnal leg cramps Last labs 3/15 showed K 4.9  No mag    Past Surgical History  Procedure Laterality Date  . Cardiac assist device removal  12/15/03    Explantation of a previously implanted device, pocket revision and implantation of a new defibrillator with intraoperative defibrillation threshold testing. SURGEON: [name], M.D. 12/15/03.  Hartley Barefoot      contrast veinography and implantation of dual-chamber defibrillator by Nikki Dom, M.D. 11/15/99  . Cardiac defibrillator placement      St. Jude Promote [id number]  . Coronary artery bypass graft  1989    Current Outpatient Prescriptions  Medication Sig Dispense Refill  .  aspirin 81 MG tablet Take 81 mg by mouth daily.        . bisoprolol (ZEBETA) 5 MG tablet TAKE ONE TABLET BY MOUTH ONCE DAILY  30 tablet  5  . furosemide (LASIX) 40 MG tablet TAKE ONE TABLET BY MOUTH ONCE DAILY  30 tablet  5  . Multiple Vitamins-Minerals (MULTIVITAMIN WITH MINERALS) tablet Take 1 tablet by mouth daily.        . nitroGLYCERIN (NITROSTAT) 0.4 MG SL tablet Place 1 tablet (0.4 mg total) under the tongue every 5 (five) minutes as needed for chest pain.  25 tablet  6  . ramipril (ALTACE) 10 MG capsule Take 1 capsule (10 mg total) by mouth daily.  30 capsule  6  . simvastatin (ZOCOR) 40 MG tablet TAKE ONE TABLET BY MOUTH AT BEDTIME  30 tablet  5  . spironolactone (ALDACTONE) 25 MG tablet TAKE ONE-HALF TABLET BY MOUTH EVERY NIGHT       No current facility-administered medications for this visit.    Allergies  Allergen Reactions  . Pollen Extract   . Zetia [Ezetimibe] Other (See Comments)    Leg cramping, lips swelling, shortness of breath    Review of Systems negative except from HPI and PMH  Physical Exam BP 110/42  Pulse 59  Ht 5\' 7"  (1.702 m)  Wt 149 lb 12.8 oz (67.949 kg)  BMI 23.46 kg/m2 Well developed and nourished in no acute distress HENT normal Neck supple  with JVP-flat Clear The patient's device was interrogated.  The information was reviewed. No changes were made in the programming.   Regular rate and rhythm, no murmurs or gallops Abd-soft with active BS No Clubbing cyanosis edema Skin-warm and dry A & Oriented  Grossly normal sensory and motor function   ECG demonstrates P. synchronous pacing   Assessment and  Plan  Ischemic cardiomyopathy  Congestive heart failure-chronic-systolic  CRT-D-St. Jude  The patient's device was interrogated.  The information was reviewed. No changes were made in the programming.     Sleep disturbance   His device has reached ERI.  We'll undertake a stress scan in anticipation as it has been since 2011 since he  had a catheterization  He struggles with shortness of breath and will have to see whether there is any thing that we can do with device reprogramming.  We have reviewed the benefits and risks of generator replacement.  These include but are not limited to lead fracture and infection.  The patient understands, agrees and is willing to proceed.   And will anticipate the use of any his pouch as well as the device being placed in a submuscular pocket

## 2013-12-13 NOTE — Interval H&P Note (Signed)
ICD Criteria  Current LVEF:40% ;Obtained < 1 month ago.  NYHA Functional Classification: Class II  Heart Failure History:  Yes, Duration of heart failure since onset is > 9 months  Non-Ischemic Dilated Cardiomyopathy History:  No.  Atrial Fibrillation/Atrial Flutter:  No.  Ventricular Tachycardia History:  Yes, No hemodynamic instability. VT Type:  SVT - Monomorphic.  Cardiac Arrest History:  No  History of Syndromes with Risk of Sudden Death:  No.  Previous ICD:  Yes, ICD Type:  CRT-D, Reason for ICD:  Primary prevention.  LVEF is not available  Electrophysiology Study: No.  Prior MI: Yes, Most recent MI timeframe is > 40 days.  PPM: No.  OSA:  No  Patient Life Expectancy of >=1 year: Yes.  Anticoagulation Therapy:  Patient is NOT on anticoagulation therapy.   Beta Blocker Therapy:  Yes.   Ace Inhibitor/ARB Therapy:  Yes.History and Physical Interval Note:  12/13/2013 7:12 AM  [name] [name]  has presented today for surgery, with the diagnosis of asm  The various methods of treatment have been discussed with the patient and family. After consideration of risks, benefits and other options for treatment, the patient has consented to  Procedure(s): BI-VENTRICULAR IMPLANTABLE CARDIOVERTER DEFIBRILLATOR UPGRADE (N/A) as a surgical intervention .  The patient's history has been reviewed, patient examined, no change in status, stable for surgery.  I have reviewed the patient's chart and labs.  Questions were answered to the patient's satisfaction.     Virl Axe

## 2013-12-23 ENCOUNTER — Ambulatory Visit (INDEPENDENT_AMBULATORY_CARE_PROVIDER_SITE_OTHER): Payer: Medicare Other | Admitting: *Deleted

## 2013-12-23 ENCOUNTER — Encounter: Payer: Self-pay | Admitting: Internal Medicine

## 2013-12-23 DIAGNOSIS — I5022 Chronic systolic (congestive) heart failure: Secondary | ICD-10-CM

## 2013-12-23 LAB — MDC_IDC_ENUM_SESS_TYPE_INCLINIC
Battery Voltage: 3.13 V
Brady Statistic AP VP Percent: 29.84 %
Brady Statistic AP VS Percent: 1.2 %
Brady Statistic AS VP Percent: 67.8 %
Brady Statistic RA Percent Paced: 31.03 %
Date Time Interrogation Session: 20151001150359
HIGH POWER IMPEDANCE MEASURED VALUE: 247 Ohm
HighPow Impedance: 45 Ohm
HighPow Impedance: 73 Ohm
Lead Channel Impedance Value: 1159 Ohm
Lead Channel Impedance Value: 380 Ohm
Lead Channel Impedance Value: 456 Ohm
Lead Channel Impedance Value: 570 Ohm
Lead Channel Pacing Threshold Amplitude: 0.5 V
Lead Channel Pacing Threshold Amplitude: 0.75 V
Lead Channel Pacing Threshold Pulse Width: 0.4 ms
Lead Channel Pacing Threshold Pulse Width: 1 ms
Lead Channel Sensing Intrinsic Amplitude: 1.625 mV
Lead Channel Sensing Intrinsic Amplitude: 2 mV
Lead Channel Setting Pacing Amplitude: 1.5 V
Lead Channel Setting Pacing Pulse Width: 1 ms
Lead Channel Setting Sensing Sensitivity: 0.3 mV
MDC IDC MSMT BATTERY REMAINING LONGEVITY: 114 mo
MDC IDC MSMT LEADCHNL LV IMPEDANCE VALUE: 722 Ohm
MDC IDC MSMT LEADCHNL LV PACING THRESHOLD PULSEWIDTH: 0.4 ms
MDC IDC MSMT LEADCHNL RV PACING THRESHOLD AMPLITUDE: 1.75 V
MDC IDC MSMT LEADCHNL RV SENSING INTR AMPL: 3.125 mV
MDC IDC MSMT LEADCHNL RV SENSING INTR AMPL: 3.625 mV
MDC IDC SET LEADCHNL LV PACING AMPLITUDE: 1.5 V
MDC IDC SET LEADCHNL LV PACING PULSEWIDTH: 0.4 ms
MDC IDC SET LEADCHNL RV PACING AMPLITUDE: 3.5 V
MDC IDC SET ZONE DETECTION INTERVAL: 350 ms
MDC IDC SET ZONE DETECTION INTERVAL: 450 ms
MDC IDC STAT BRADY AS VS PERCENT: 1.17 %
MDC IDC STAT BRADY RV PERCENT PACED: 5.69 %
Zone Setting Detection Interval: 280 ms
Zone Setting Detection Interval: 370 ms

## 2013-12-23 NOTE — Progress Notes (Signed)
Wound check appointment.  Wound without redness or edema. Incision edges approximated, wound well healed. Normal device function. Thresholds, sensing, and impedances consistent with implant measurements.  R-waves chronic @ 3.68mV.   Device programmed at 3.5V for extra safety margin until 3 month visit. Histogram distribution appropriate for patient and level of activity. No mode switches or ventricular arrhythmias noted. Patient educated about wound care, arm mobility, lifting restrictions, shock plan. ROV in 3 months with implanting physician.

## 2013-12-25 ENCOUNTER — Encounter (HOSPITAL_COMMUNITY): Payer: Self-pay | Admitting: *Deleted

## 2014-01-07 NOTE — Telephone Encounter (Signed)
Nothing was typed/tmj 

## 2014-03-03 ENCOUNTER — Encounter (HOSPITAL_COMMUNITY): Payer: Self-pay | Admitting: Internal Medicine

## 2014-03-03 ENCOUNTER — Other Ambulatory Visit: Payer: Self-pay | Admitting: Cardiovascular Disease

## 2014-03-03 ENCOUNTER — Other Ambulatory Visit: Payer: Self-pay | Admitting: Internal Medicine

## 2014-03-31 ENCOUNTER — Encounter: Payer: Self-pay | Admitting: Internal Medicine

## 2014-03-31 ENCOUNTER — Ambulatory Visit (INDEPENDENT_AMBULATORY_CARE_PROVIDER_SITE_OTHER): Payer: Medicare Other | Admitting: Internal Medicine

## 2014-03-31 VITALS — BP 110/60 | HR 75 | Ht 67.0 in | Wt 151.2 lb

## 2014-03-31 DIAGNOSIS — I5022 Chronic systolic (congestive) heart failure: Secondary | ICD-10-CM

## 2014-03-31 DIAGNOSIS — I255 Ischemic cardiomyopathy: Secondary | ICD-10-CM

## 2014-03-31 DIAGNOSIS — Z4502 Encounter for adjustment and management of automatic implantable cardiac defibrillator: Secondary | ICD-10-CM

## 2014-03-31 LAB — MDC_IDC_ENUM_SESS_TYPE_INCLINIC
Battery Voltage: 3.07 V
Brady Statistic AP VP Percent: 20.74 %
Brady Statistic AP VS Percent: 0.77 %
Brady Statistic AS VS Percent: 2.02 %
Brady Statistic RA Percent Paced: 21.5 %
Brady Statistic RV Percent Paced: 8.47 %
HIGH POWER IMPEDANCE MEASURED VALUE: 247 Ohm
HighPow Impedance: 51 Ohm
HighPow Impedance: 71 Ohm
Lead Channel Impedance Value: 1083 Ohm
Lead Channel Impedance Value: 665 Ohm
Lead Channel Pacing Threshold Amplitude: 0.75 V
Lead Channel Pacing Threshold Pulse Width: 0.4 ms
Lead Channel Pacing Threshold Pulse Width: 0.4 ms
Lead Channel Sensing Intrinsic Amplitude: 1.875 mV
Lead Channel Setting Pacing Amplitude: 2.5 V
Lead Channel Setting Pacing Pulse Width: 0.4 ms
Lead Channel Setting Pacing Pulse Width: 1 ms
MDC IDC MSMT BATTERY REMAINING LONGEVITY: 111 mo
MDC IDC MSMT LEADCHNL LV IMPEDANCE VALUE: 532 Ohm
MDC IDC MSMT LEADCHNL LV PACING THRESHOLD AMPLITUDE: 0.5 V
MDC IDC MSMT LEADCHNL RA IMPEDANCE VALUE: 494 Ohm
MDC IDC MSMT LEADCHNL RA PACING THRESHOLD PULSEWIDTH: 0.4 ms
MDC IDC MSMT LEADCHNL RV IMPEDANCE VALUE: 380 Ohm
MDC IDC MSMT LEADCHNL RV PACING THRESHOLD AMPLITUDE: 2.375 V
MDC IDC MSMT LEADCHNL RV SENSING INTR AMPL: 2.875 mV
MDC IDC SESS DTM: 20160107133708
MDC IDC SET LEADCHNL LV PACING AMPLITUDE: 1.5 V
MDC IDC SET LEADCHNL RA PACING AMPLITUDE: 2 V
MDC IDC SET LEADCHNL RV SENSING SENSITIVITY: 0.3 mV
MDC IDC SET ZONE DETECTION INTERVAL: 370 ms
MDC IDC STAT BRADY AS VP PERCENT: 76.48 %
Zone Setting Detection Interval: 280 ms
Zone Setting Detection Interval: 350 ms
Zone Setting Detection Interval: 450 ms

## 2014-03-31 NOTE — Progress Notes (Signed)
Patient Care Team: Abigail Miyamoto, MD as PCP - General (Family Medicine)   HPI  [name] [name] is a 81 y.o. male seen in followup for Ischemic cardiomyopathy congestive heart failure for which he is s/p CRT-D implantation. He is s/p CABG and most recent EF was about 30-35% in fall of 2009.   He has stable low amplitude R waves  He says his breathing is much better since his AV optimization echo; he is hoping that there is greater benefit to be gained.    His device reached ERI 9/15 he underwent generator replacement at that time choosing to have CRT-D implanted is a focus the SVC.  He's not having chest pain or shortness of breath or edema  He is now married about a 4 years.  He underwent catheterization in July of 2011. He had total occlusion of the vein graft to the right coronary artery and total occlusion of the vein graft to the diagonal branch. Ejection fraction was 40%.  SR  He had intercurrent ventricular tachycardia treated with antitachycardia pacing; the patient was unaware of this.  The patient denies chest pain,  nocturnal dyspnea, orthopnea or peripheral edema.  There have been no palpitations, lightheadedness or syncope. He has chronic shortness of breath    Last labs 3/15 showed K 4.9  No mag    Past Surgical History  Procedure Laterality Date  . Bi-ventricular implantable cardioverter defibrillator  (crt-d)  2005; 2015    STJ CRTD implanted by Dr Caryl Comes  . Coronary artery bypass graft  1989  . Bi-ventricular implantable cardioverter defibrillator upgrade N/A 12/13/2013    Procedure: BI-VENTRICULAR IMPLANTABLE CARDIOVERTER DEFIBRILLATOR UPGRADE;  Surgeon: [name], MD;  Location: [facility] CATH LAB;  Service: Cardiovascular;  Laterality: N/A;    Current Outpatient Prescriptions  Medication Sig Dispense Refill  . acetaminophen (TYLENOL) 500 MG tablet Take 1,000 mg by mouth daily as needed for headache.    Marland Kitchen aspirin 81 MG tablet Take 81 mg by mouth daily.      . bisoprolol (ZEBETA) 5 MG tablet Take 5 mg by mouth daily.    . furosemide (LASIX) 40 MG tablet Take 40 mg by mouth.    . Multiple Vitamins-Minerals (MULTIVITAMIN WITH MINERALS) tablet Take 1 tablet by mouth daily.     . nitroGLYCERIN (NITROSTAT) 0.4 MG SL tablet Place 1 tablet (0.4 mg total) under the tongue every 5 (five) minutes as needed for chest pain. 25 tablet 6  . ramipril (ALTACE) 10 MG capsule Take 10 mg by mouth daily.    . simvastatin (ZOCOR) 40 MG tablet Take 40 mg by mouth daily.    Marland Kitchen spironolactone (ALDACTONE) 25 MG tablet Take 12.5 mg by mouth daily.     No current facility-administered medications for this visit.    Allergies  Allergen Reactions  . Pollen Extract   . Crestor [Rosuvastatin] Anxiety    Review of Systems negative except from HPI and PMH  Physical Exam BP 110/60 mmHg  Pulse 75  Ht 5\' 7"  (1.702 m)  Wt 151 lb 3.2 oz (68.584 kg)  BMI 23.68 kg/m2 Well developed and nourished in no acute distress HENT normal Neck supple with JVP-flat Clear The patient's device was interrogated.  The information was reviewed. No changes were made in the programming.   Regular rate and rhythm, no murmurs or gallops Abd-soft with active BS No Clubbing cyanosis edema Skin-warm and dry A & Oriented  Grossly normal sensory and motor function  ECG demonstrates P. synchronous pacing   Assessment and  Plan  Ischemic cardiomyopathy  Congestive heart failure-chronic-systolic  CRT-D-St. Jude  The patient's device was interrogated.  The information was reviewed. No changes were made in the programming.    Without symptoms of ischemia  Euvolemic continue current meds

## 2014-03-31 NOTE — Patient Instructions (Signed)
Your physician recommends that you continue on your current medications as directed. Please refer to the Current Medication list given to you today.  Remote monitoring is used to monitor your Pacemaker of ICD from home. This monitoring reduces the number of office visits required to check your device to one time per year. It allows [place] to keep an eye on the functioning of your device to ensure it is working properly. You are scheduled for a device check from home on 06/30/14. You may send your transmission at any time that day. If you have a wireless device, the transmission will be sent automatically. After your physician reviews your transmission, you will receive a postcard with your next transmission date.  Your physician wants you to follow-up in: 9 months with Dr. Caryl Comes.  You will receive a reminder letter in the mail two months in advance. If you don't receive a letter, please call our office to schedule the follow-up appointment.

## 2014-04-18 ENCOUNTER — Other Ambulatory Visit: Payer: Self-pay | Admitting: Cardiology

## 2014-06-30 ENCOUNTER — Ambulatory Visit (INDEPENDENT_AMBULATORY_CARE_PROVIDER_SITE_OTHER): Payer: Medicare Other | Admitting: *Deleted

## 2014-06-30 DIAGNOSIS — I255 Ischemic cardiomyopathy: Secondary | ICD-10-CM | POA: Diagnosis not present

## 2014-06-30 DIAGNOSIS — I5022 Chronic systolic (congestive) heart failure: Secondary | ICD-10-CM | POA: Diagnosis not present

## 2014-06-30 LAB — MDC_IDC_ENUM_SESS_TYPE_REMOTE
Battery Remaining Longevity: 109 mo
Brady Statistic AP VP Percent: 19.63 %
Brady Statistic AP VS Percent: 1.48 %
Brady Statistic AS VP Percent: 75.61 %
Brady Statistic AS VS Percent: 3.28 %
Brady Statistic RA Percent Paced: 21.11 %
Brady Statistic RV Percent Paced: 2.68 %
Date Time Interrogation Session: 20160407140906
HighPow Impedance: 48 Ohm
HighPow Impedance: 67 Ohm
Lead Channel Impedance Value: 399 Ohm
Lead Channel Impedance Value: 513 Ohm
Lead Channel Impedance Value: 532 Ohm
Lead Channel Impedance Value: 665 Ohm
Lead Channel Pacing Threshold Amplitude: 0.5 V
Lead Channel Pacing Threshold Amplitude: 1.125 V
Lead Channel Pacing Threshold Amplitude: 2.375 V
Lead Channel Pacing Threshold Pulse Width: 0.4 ms
Lead Channel Sensing Intrinsic Amplitude: 2.125 mV
Lead Channel Sensing Intrinsic Amplitude: 4 mV
Lead Channel Sensing Intrinsic Amplitude: 4 mV
Lead Channel Setting Pacing Amplitude: 1.5 V
Lead Channel Setting Pacing Amplitude: 4.25 V
Lead Channel Setting Pacing Pulse Width: 0.4 ms
Lead Channel Setting Pacing Pulse Width: 0.4 ms
Lead Channel Setting Sensing Sensitivity: 0.3 mV
MDC IDC MSMT BATTERY VOLTAGE: 3.03 V
MDC IDC MSMT LEADCHNL LV IMPEDANCE VALUE: 1045 Ohm
MDC IDC MSMT LEADCHNL LV PACING THRESHOLD PULSEWIDTH: 0.4 ms
MDC IDC MSMT LEADCHNL RA SENSING INTR AMPL: 2.125 mV
MDC IDC MSMT LEADCHNL RV IMPEDANCE VALUE: 247 Ohm
MDC IDC MSMT LEADCHNL RV PACING THRESHOLD PULSEWIDTH: 0.4 ms
MDC IDC SET LEADCHNL RA PACING AMPLITUDE: 2.25 V
MDC IDC SET ZONE DETECTION INTERVAL: 350 ms
MDC IDC SET ZONE DETECTION INTERVAL: 370 ms
Zone Setting Detection Interval: 280 ms
Zone Setting Detection Interval: 450 ms

## 2014-06-30 NOTE — Progress Notes (Signed)
Remote ICD transmission.   

## 2014-07-05 ENCOUNTER — Ambulatory Visit (INDEPENDENT_AMBULATORY_CARE_PROVIDER_SITE_OTHER): Payer: Medicare Other | Admitting: Cardiovascular Disease

## 2014-07-05 ENCOUNTER — Encounter: Payer: Self-pay | Admitting: Cardiovascular Disease

## 2014-07-05 VITALS — BP 130/62 | HR 71 | Ht 67.0 in | Wt 153.6 lb

## 2014-07-05 DIAGNOSIS — I255 Ischemic cardiomyopathy: Secondary | ICD-10-CM | POA: Diagnosis not present

## 2014-07-05 DIAGNOSIS — I10 Essential (primary) hypertension: Secondary | ICD-10-CM

## 2014-07-05 DIAGNOSIS — I5022 Chronic systolic (congestive) heart failure: Secondary | ICD-10-CM | POA: Diagnosis not present

## 2014-07-05 DIAGNOSIS — I2510 Atherosclerotic heart disease of native coronary artery without angina pectoris: Secondary | ICD-10-CM | POA: Diagnosis not present

## 2014-07-05 DIAGNOSIS — E785 Hyperlipidemia, unspecified: Secondary | ICD-10-CM

## 2014-07-05 MED ORDER — LOSARTAN POTASSIUM 50 MG PO TABS: 50.0000 mg | ORAL_TABLET | Freq: Every day | ORAL | Status: DC

## 2014-07-05 NOTE — Patient Instructions (Signed)
Medication Instructions:  Your physician has recommended you make the following change in your medication:  Stop Ramipril. Start Cozaar 50 mg by mouth daily.    Labwork: none  Testing/Procedures: none  Follow-Up: Your physician wants you to follow-up in:  12 months.  You will receive a reminder letter in the mail two months in advance. If you don't receive a letter, please call our office to schedule the follow-up appointment.

## 2014-07-05 NOTE — Progress Notes (Signed)
Chief Complaint  Patient presents with  . Shortness of Breath    History of Present Illness: 81 yo WM with history of CAD, ischemic CM s/p ICD, systolic CHF who is here today for cardiac follow up. He had remote bypass surgery. He has an ischemic cardiomyopathy with an ejection fraction of 30% but improved to 35-40% following biventricular pacing. He had an ICD placed and in 2009 had an upgrade to a biventricular pacemaker. Because of symptoms of fatigue he underwent catheterization in July of 2011. He had total occlusion of the vein graft to the right coronary artery and total occlusion of the vein graft to the diagonal branch. Ejection fraction was 40%. Stress myoview September 2015 with LVEF=43%, scar but no large areas of ischemia. ICD generator change September 2015 per Dr. Caryl Comes.    He is here today for follow up. He is doing well. No chest pain.  No dizziness, near syncope or syncope. He has some baseline dyspnea which is unchanged from before. No lower ext edema. He reports lip swelling after taking ramipril.   Primary Care Physician: Dr. Maceo Pro    Past Medical History  Diagnosis Date  . COPD (chronic obstructive pulmonary disease)   . CAD (coronary artery disease)     status post coronary bypass graft surgery in 1989 with documented occulsion of the vein graft to the diagonal branch of the left anterior descending in 2001.  . Ischemic cardiomyopathy     ejection fraction of 30% improved 35-40% 5/09  . Systolic congestive heart failure     Class III systolic congestive heart, improved to class II, euvolemic.  Marland Kitchen Hyperlipidemia   . Hypertension     Past Surgical History  Procedure Laterality Date  . Bi-ventricular implantable cardioverter defibrillator  (crt-d)  2005; 2015    STJ CRTD implanted by Dr Caryl Comes  . Coronary artery bypass graft  1989  . Bi-ventricular implantable cardioverter defibrillator upgrade N/A 12/13/2013    Procedure: BI-VENTRICULAR IMPLANTABLE CARDIOVERTER  DEFIBRILLATOR UPGRADE;  Surgeon: [name], MD;  Location: [facility] CATH LAB;  Service: Cardiovascular;  Laterality: N/A;    Current Outpatient Prescriptions  Medication Sig Dispense Refill  . acetaminophen (TYLENOL) 500 MG tablet Take 1,000 mg by mouth daily as needed for headache.    Marland Kitchen aspirin 81 MG tablet Take 81 mg by mouth daily.     . bisoprolol (ZEBETA) 5 MG tablet Take 5 mg by mouth daily.    . furosemide (LASIX) 40 MG tablet Take 40 mg by mouth.    . Multiple Vitamins-Minerals (MULTIVITAMIN WITH MINERALS) tablet Take 1 tablet by mouth daily.     . nitroGLYCERIN (NITROSTAT) 0.4 MG SL tablet Place 1 tablet (0.4 mg total) under the tongue every 5 (five) minutes as needed for chest pain. 25 tablet 6  . ramipril (ALTACE) 10 MG capsule Take 10 mg by mouth daily.    . ramipril (ALTACE) 10 MG capsule TAKE ONE CAPSULE BY MOUTH TWICE DAILY 60 capsule 3  . simvastatin (ZOCOR) 40 MG tablet Take 40 mg by mouth daily.    Marland Kitchen spironolactone (ALDACTONE) 25 MG tablet Take 12.5 mg by mouth daily.     No current facility-administered medications for this visit.    Allergies  Allergen Reactions  . Pollen Extract   . Ramipril Hives and Swelling  . Crestor [Rosuvastatin] Anxiety    History   Social History  . Marital Status: Widowed    Spouse Name: N/A  . Number of Children: 5  .  Years of Education: N/A   Occupational History  .     Social History Main Topics  . Smoking status: Former Smoker -- 1.00 packs/day for 43 years    Types: Cigarettes    Quit date: 12/07/1987  . Smokeless tobacco: Not on file  . Alcohol Use: No  . Drug Use: No  . Sexual Activity: Not on file   Other Topics Concern  . Not on file   Social History Narrative    Family History  Problem Relation Age of Onset  . Heart failure Mother     Review of Systems:  As stated in the HPI and otherwise negative.   BP 130/62 mmHg  Pulse 71  Ht 5\' 7"  (1.702 m)  Wt 153 lb 9.6 oz (69.673 kg)  BMI 24.05  kg/m2  Physical Examination: General: Well developed, well nourished, NAD HEENT: OP clear, mucus membranes moist SKIN: warm, dry. No rashes. Neuro: No focal deficits Musculoskeletal: Muscle strength 5/5 all ext Psychiatric: Mood and affect normal Neck: No JVD, no carotid bruits, no thyromegaly, no lymphadenopathy. Lungs:Clear bilaterally, no wheezes, rhonci, crackles Cardiovascular: Regular rate and rhythm. No murmurs, gallops or rubs. Abdomen:Soft. Bowel sounds present. Non-tender.  Extremities: No lower extremity edema. Pulses are 2 + in the bilateral DP/PT.  EKG:  EKG is not ordered today. The ekg ordered today demonstrates   Recent Labs: 10/08/2013: Magnesium 2.4 12/06/2013: BUN 24*; Creatinine 1.5; Hemoglobin 14.1; Platelets 164.0; Potassium 4.3; Sodium 138   Lipid Panel    Component Value Date/Time   CHOL 184 07/05/2013 1215   TRIG 251.0* 07/05/2013 1215   TRIG 71    HDL 38.50* 07/05/2013 1215   CHOLHDL 5 07/05/2013 1215   CHOLHDL 3.6 CALC    VLDL 50.2* 07/05/2013 1215   LDLCALC 95 07/05/2013 1215   LDLDIRECT       Wt Readings from Last 3 Encounters:  07/05/14 153 lb 9.6 oz (69.673 kg)  03/31/14 151 lb 3.2 oz (68.584 kg)  12/13/13 145 lb (65.772 kg)     Other studies Reviewed: Additional studies/ records that were reviewed today include: Review of the above records demonstrates:   Assessment and Plan:   1. CAD: Stable. He is known to have severe CAD with occlusion of the left main and proximal RCA with patent grafts to the OM/PLA and LAD. Occluded grafts to Diagonal and PDA by cath 2011. Stress myoview without ischemia Sept 2015. Not having active chest pain. Will continue conservative management with medical therapy for now.   2. Ischemic Cardiomyopathy: He is on good medications but will stop Ramipril with possible side effects. Start Cozaar 50 mg daily. Volume status is ok. ICD followed by Dr. Caryl Comes.   3. HTN: BP controlled today. No changes in therapy.    4. HLD: He is on a statin.   5. Chronic systolic CHF: No clinical signs of volume overload. Weight is stable. Continue current medication regimen.   Current medicines are reviewed at length with the patient today.  The patient does not have concerns regarding medicines.  The following changes have been made:  no change  Labs/ tests ordered today include:  No orders of the defined types were placed in this encounter.    Disposition:   FU with me in 12  months  Signed, Lauree Chandler, MD 07/05/2014 2:34 PM    [address], [place], [place]  [address] Phone: [phone]; Fax: [phone]

## 2014-08-01 ENCOUNTER — Other Ambulatory Visit: Payer: Self-pay | Admitting: Cardiovascular Disease

## 2014-08-02 ENCOUNTER — Encounter: Payer: Self-pay | Admitting: Cardiology

## 2014-08-05 ENCOUNTER — Encounter: Payer: Self-pay | Admitting: Internal Medicine

## 2014-09-05 ENCOUNTER — Other Ambulatory Visit: Payer: Self-pay | Admitting: Internal Medicine

## 2014-10-03 ENCOUNTER — Ambulatory Visit (INDEPENDENT_AMBULATORY_CARE_PROVIDER_SITE_OTHER): Payer: Medicare Other | Admitting: *Deleted

## 2014-10-03 ENCOUNTER — Encounter: Payer: Self-pay | Admitting: Internal Medicine

## 2014-10-03 DIAGNOSIS — I255 Ischemic cardiomyopathy: Secondary | ICD-10-CM

## 2014-10-03 DIAGNOSIS — I5022 Chronic systolic (congestive) heart failure: Secondary | ICD-10-CM

## 2014-10-04 NOTE — Progress Notes (Signed)
Remote ICD transmission.   

## 2014-10-06 LAB — CUP PACEART REMOTE DEVICE CHECK
Battery Remaining Longevity: 106 mo
Brady Statistic AP VP Percent: 50.43 %
Brady Statistic AS VS Percent: 4.37 %
Brady Statistic RA Percent Paced: 55.34 %
Brady Statistic RV Percent Paced: 8.74 %
HighPow Impedance: 50 Ohm
HighPow Impedance: 72 Ohm
Lead Channel Impedance Value: 1045 Ohm
Lead Channel Impedance Value: 266 Ohm
Lead Channel Impedance Value: 380 Ohm
Lead Channel Pacing Threshold Amplitude: 2 V
Lead Channel Pacing Threshold Pulse Width: 0.4 ms
Lead Channel Pacing Threshold Pulse Width: 0.4 ms
Lead Channel Sensing Intrinsic Amplitude: 2 mV
Lead Channel Sensing Intrinsic Amplitude: 2 mV
Lead Channel Sensing Intrinsic Amplitude: 2.375 mV
Lead Channel Sensing Intrinsic Amplitude: 2.375 mV
Lead Channel Setting Pacing Amplitude: 1.5 V
MDC IDC MSMT BATTERY VOLTAGE: 3.01 V
MDC IDC MSMT LEADCHNL LV IMPEDANCE VALUE: 513 Ohm
MDC IDC MSMT LEADCHNL LV IMPEDANCE VALUE: 665 Ohm
MDC IDC MSMT LEADCHNL LV PACING THRESHOLD AMPLITUDE: 0.5 V
MDC IDC MSMT LEADCHNL LV PACING THRESHOLD PULSEWIDTH: 0.4 ms
MDC IDC MSMT LEADCHNL RA IMPEDANCE VALUE: 513 Ohm
MDC IDC MSMT LEADCHNL RA PACING THRESHOLD AMPLITUDE: 0.875 V
MDC IDC SESS DTM: 20160711063331
MDC IDC SET LEADCHNL LV PACING AMPLITUDE: 1.5 V
MDC IDC SET LEADCHNL LV PACING PULSEWIDTH: 0.4 ms
MDC IDC SET LEADCHNL RV PACING AMPLITUDE: 4.5 V
MDC IDC SET LEADCHNL RV PACING PULSEWIDTH: 0.4 ms
MDC IDC SET LEADCHNL RV SENSING SENSITIVITY: 0.3 mV
MDC IDC SET ZONE DETECTION INTERVAL: 350 ms
MDC IDC SET ZONE DETECTION INTERVAL: 370 ms
MDC IDC SET ZONE DETECTION INTERVAL: 450 ms
MDC IDC STAT BRADY AP VS PERCENT: 4.91 %
MDC IDC STAT BRADY AS VP PERCENT: 40.29 %
Zone Setting Detection Interval: 280 ms

## 2014-10-28 ENCOUNTER — Encounter: Payer: Self-pay | Admitting: Cardiology

## 2015-01-03 ENCOUNTER — Encounter: Payer: Self-pay | Admitting: Internal Medicine

## 2015-01-03 ENCOUNTER — Ambulatory Visit (INDEPENDENT_AMBULATORY_CARE_PROVIDER_SITE_OTHER): Payer: Medicare Other | Admitting: Internal Medicine

## 2015-01-03 VITALS — BP 134/62 | HR 65 | Ht 67.0 in | Wt 150.4 lb

## 2015-01-03 DIAGNOSIS — I5022 Chronic systolic (congestive) heart failure: Secondary | ICD-10-CM | POA: Diagnosis not present

## 2015-01-03 DIAGNOSIS — I255 Ischemic cardiomyopathy: Secondary | ICD-10-CM

## 2015-01-03 NOTE — Progress Notes (Signed)
Patient Care Team: Briscoe Deutscher, MD as PCP - General (Family Medicine)   HPI  [name] [name] is a 81 y.o. male seen in followup for Ischemic cardiomyopathy congestive heart failure for which he is s/p CRT-D implantation. He is s/p CABG and most recent EF was about 30-35% in fall of 2009.   He has stable low amplitude R waves  He says his breathing is much better since his AV optimization echo; he is hoping that there is greater benefit to be gained.    His device reached ERI 9/15 he underwent generator replacement at that time choosing to have CRT-D implanted    Is having some shortness of breath but he is wife both think it stable over the last year.The patient denies chest pain,  nocturnal dyspnea, orthopnea or peripheral edema.   He is now married about a 5 years.  He underwent catheterization in July of 2011. He had total occlusion of the vein graft to the right coronary artery and total occlusion of the vein graft to the diagonal branch. Ejection fraction was 40%.  SR  He had intercurrent ventricular tachycardia treated with antitachycardia pacing; the patient was unaware of this.   Last labs 3/15 showed K 4.9  No mag    Past Surgical History  Procedure Laterality Date  . Bi-ventricular implantable cardioverter defibrillator  (crt-d)  2005; 2015    STJ CRTD implanted by Dr Caryl Comes  . Coronary artery bypass graft  1989  . Bi-ventricular implantable cardioverter defibrillator upgrade N/A 12/13/2013    Procedure: BI-VENTRICULAR IMPLANTABLE CARDIOVERTER DEFIBRILLATOR UPGRADE;  Surgeon: [name], MD;  Location: [facility] CATH LAB;  Service: Cardiovascular;  Laterality: N/A;    Current Outpatient Prescriptions  Medication Sig Dispense Refill  . acetaminophen (TYLENOL) 500 MG tablet Take 1,000 mg by mouth daily as needed for headache.    Marland Kitchen aspirin 81 MG tablet Take 81 mg by mouth daily.     . bisoprolol (ZEBETA) 5 MG tablet Take 5 mg by mouth daily.    . furosemide  (LASIX) 40 MG tablet TAKE ONE TABLET BY MOUTH ONCE DAILY 30 tablet 11  . losartan (COZAAR) 50 MG tablet Take 1 tablet (50 mg total) by mouth daily. 30 tablet 11  . Multiple Vitamins-Minerals (MULTIVITAMIN WITH MINERALS) tablet Take 1 tablet by mouth daily.     . nitroGLYCERIN (NITROSTAT) 0.4 MG SL tablet Place 1 tablet (0.4 mg total) under the tongue every 5 (five) minutes as needed for chest pain. 25 tablet 6  . simvastatin (ZOCOR) 40 MG tablet Take 40 mg by mouth daily.    Marland Kitchen spironolactone (ALDACTONE) 25 MG tablet TAKE ONE-HALF TABLET BY MOUTH ONCE DAILY 45 tablet 3   No current facility-administered medications for this visit.    Allergies  Allergen Reactions  . Pollen Extract   . Ramipril Hives and Swelling  . Crestor [Rosuvastatin] Anxiety    Review of Systems negative except from HPI and PMH  Physical Exam BP 134/62 mmHg  Pulse 65  Ht 5\' 7"  (1.702 m)  Wt 150 lb 6.4 oz (68.221 kg)  BMI 23.55 kg/m2 Well developed and nourished in no acute distress HENT normal Neck supple with [id number] Clear Device pocket well healed; without hematoma or erythema.  There is a superficial erythematous plaque at the base distal caudal aspect of his defibrillator. There is no tethering The patient's device was interrogated.  The information was reviewed. No changes were made in the programming.  Regular rate and rhythm, no murmurs or gallops Abd-soft with active BS No Clubbing cyanosis tr edema Skin-warm and dry A & Oriented  Grossly normal sensory and motor function   ECG demonstrates P. synchronous pacing with frequent PVCs   Assessment and  Plan  Ischemic cardiomyopathy  Congestive heart failure-chronic-systolic  CRT-D- [employer] The patient's device was interrogated.  The information was reviewed. No changes were made in the programming.  PVCs     Without symptoms of ischemia  There is evidence of volume overload.  His symptoms of dyspnea while he says are not worse are  different from my note. In addition, electrocardiogram demonstrates a significant increase in ventricular ectopy certain and his device interrogation demonstrates about 20% PVCs. It is possible that these are contributing to her cardiomyopathy and hence his shortness of breath being worse.  We will check his laboratories when he goes to see his PCP on 10/24. We will need to get a copy of these.  We will also a  2-D echo to see if there is been interval deterioration of LV function  Therapeutic options for his PVCs will be clearer once we know his electrolytes are.

## 2015-01-03 NOTE — Patient Instructions (Signed)
Medication Instructions: - no changes  Labwork: - none  Procedures/Testing: - none  Follow-Up: - Remote monitoring is used to monitor your Pacemaker of ICD from home. This monitoring reduces the number of office visits required to check your device to one time per year. It allows [place] to keep an eye on the functioning of your device to ensure it is working properly. You are scheduled for a device check from home on 04/04/15. You may send your transmission at any time that day. If you have a wireless device, the transmission will be sent automatically. After your physician reviews your transmission, you will receive a postcard with your next transmission date.  - Your physician wants you to follow-up in: 6 months with Chanetta Marshall, NP for Dr. Caryl Comes & 1 year with Dr. Caryl Comes. You will receive a reminder letter in the mail two months in advance. If you don't receive a letter, please call our office to schedule the follow-up appointment.  Any Additional Special Instructions Will Be Listed Below (If Applicable). - none

## 2015-01-05 LAB — CUP PACEART INCLINIC DEVICE CHECK
Battery Remaining Longevity: 102 mo
Battery Voltage: 3 V
Brady Statistic RA Percent Paced: 41.81 %
Brady Statistic RV Percent Paced: 8.76 %
HighPow Impedance: 50 Ohm
HighPow Impedance: 71 Ohm
Implantable Lead Implant Date: 20010823
Implantable Lead Implant Date: 20010823
Implantable Lead Implant Date: 20091119
Implantable Lead Location: 753858
Implantable Lead Model: 148
Implantable Lead Model: 6940
Implantable Lead Serial Number: 110961
Lead Channel Impedance Value: 437 Ohm
Lead Channel Pacing Threshold Amplitude: 2.25 V
Lead Channel Pacing Threshold Pulse Width: 0.4 ms
Lead Channel Pacing Threshold Pulse Width: 0.4 ms
Lead Channel Sensing Intrinsic Amplitude: 2.125 mV
Lead Channel Sensing Intrinsic Amplitude: 2.25 mV
Lead Channel Sensing Intrinsic Amplitude: 3.125 mV
Lead Channel Setting Pacing Pulse Width: 0.4 ms
MDC IDC LEAD LOCATION: 753859
MDC IDC LEAD LOCATION: 753860
MDC IDC MSMT LEADCHNL LV IMPEDANCE VALUE: 1083 Ohm
MDC IDC MSMT LEADCHNL LV IMPEDANCE VALUE: 532 Ohm
MDC IDC MSMT LEADCHNL LV IMPEDANCE VALUE: 665 Ohm
MDC IDC MSMT LEADCHNL LV PACING THRESHOLD AMPLITUDE: 0.5 V
MDC IDC MSMT LEADCHNL LV PACING THRESHOLD PULSEWIDTH: 0.4 ms
MDC IDC MSMT LEADCHNL RA PACING THRESHOLD AMPLITUDE: 0.75 V
MDC IDC MSMT LEADCHNL RV IMPEDANCE VALUE: 266 Ohm
MDC IDC MSMT LEADCHNL RV IMPEDANCE VALUE: 380 Ohm
MDC IDC MSMT LEADCHNL RV SENSING INTR AMPL: 5.375 mV
MDC IDC SESS DTM: 20161011190849
MDC IDC SET LEADCHNL LV PACING AMPLITUDE: 1.5 V
MDC IDC SET LEADCHNL RA PACING AMPLITUDE: 2.5 V
MDC IDC SET LEADCHNL RV SENSING SENSITIVITY: 0.3 mV
MDC IDC STAT BRADY AP VP PERCENT: 38.93 %
MDC IDC STAT BRADY AP VS PERCENT: 2.88 %
MDC IDC STAT BRADY AS VP PERCENT: 54.89 %
MDC IDC STAT BRADY AS VS PERCENT: 3.3 %
Zone Setting Detection Interval: 280 ms
Zone Setting Detection Interval: 350 ms
Zone Setting Detection Interval: 370 ms

## 2015-01-30 ENCOUNTER — Encounter: Payer: Self-pay | Admitting: Internal Medicine

## 2015-04-04 ENCOUNTER — Ambulatory Visit (INDEPENDENT_AMBULATORY_CARE_PROVIDER_SITE_OTHER): Payer: Medicare Other | Admitting: *Deleted

## 2015-04-04 DIAGNOSIS — I5022 Chronic systolic (congestive) heart failure: Secondary | ICD-10-CM | POA: Diagnosis not present

## 2015-04-04 DIAGNOSIS — I255 Ischemic cardiomyopathy: Secondary | ICD-10-CM | POA: Diagnosis not present

## 2015-04-05 NOTE — Progress Notes (Signed)
Remote ICD transmission.   

## 2015-04-16 LAB — CUP PACEART REMOTE DEVICE CHECK
Battery Voltage: 3 V
Brady Statistic AP VP Percent: 32.69 %
Brady Statistic AS VP Percent: 63.81 %
Brady Statistic RA Percent Paced: 33.91 %
Brady Statistic RV Percent Paced: 7.79 %
Date Time Interrogation Session: 20170110072827
HighPow Impedance: 52 Ohm
HighPow Impedance: 75 Ohm
Implantable Lead Implant Date: 20010823
Implantable Lead Implant Date: 20091119
Implantable Lead Location: 753858
Implantable Lead Location: 753859
Implantable Lead Location: 753860
Implantable Lead Model: 6940
Implantable Lead Serial Number: 110961
Lead Channel Impedance Value: 1102 Ohm
Lead Channel Impedance Value: 532 Ohm
Lead Channel Pacing Threshold Amplitude: 0.75 V
Lead Channel Pacing Threshold Pulse Width: 0.4 ms
Lead Channel Pacing Threshold Pulse Width: 0.4 ms
Lead Channel Sensing Intrinsic Amplitude: 2.25 mV
Lead Channel Sensing Intrinsic Amplitude: 2.25 mV
Lead Channel Sensing Intrinsic Amplitude: 3.25 mV
Lead Channel Setting Pacing Pulse Width: 0.4 ms
MDC IDC LEAD IMPLANT DT: 20010823
MDC IDC MSMT BATTERY REMAINING LONGEVITY: 102 mo
MDC IDC MSMT LEADCHNL LV IMPEDANCE VALUE: 722 Ohm
MDC IDC MSMT LEADCHNL LV PACING THRESHOLD AMPLITUDE: 0.5 V
MDC IDC MSMT LEADCHNL LV PACING THRESHOLD PULSEWIDTH: 0.4 ms
MDC IDC MSMT LEADCHNL RA IMPEDANCE VALUE: 513 Ohm
MDC IDC MSMT LEADCHNL RV IMPEDANCE VALUE: 266 Ohm
MDC IDC MSMT LEADCHNL RV IMPEDANCE VALUE: 399 Ohm
MDC IDC MSMT LEADCHNL RV PACING THRESHOLD AMPLITUDE: 2.25 V
MDC IDC MSMT LEADCHNL RV SENSING INTR AMPL: 3.25 mV
MDC IDC SET LEADCHNL LV PACING AMPLITUDE: 1.5 V
MDC IDC SET LEADCHNL RA PACING AMPLITUDE: 1.5 V
MDC IDC SET LEADCHNL RV SENSING SENSITIVITY: 0.3 mV
MDC IDC STAT BRADY AP VS PERCENT: 1.22 %
MDC IDC STAT BRADY AS VS PERCENT: 2.28 %

## 2015-04-17 ENCOUNTER — Encounter: Payer: Self-pay | Admitting: Family Medicine

## 2015-04-17 ENCOUNTER — Ambulatory Visit (INDEPENDENT_AMBULATORY_CARE_PROVIDER_SITE_OTHER): Payer: Medicare Other | Admitting: Family Medicine

## 2015-04-17 VITALS — BP 139/81 | HR 74 | Temp 97.4°F | Ht 67.0 in | Wt 151.0 lb

## 2015-04-17 DIAGNOSIS — I5022 Chronic systolic (congestive) heart failure: Secondary | ICD-10-CM | POA: Diagnosis not present

## 2015-04-17 DIAGNOSIS — L989 Disorder of the skin and subcutaneous tissue, unspecified: Secondary | ICD-10-CM | POA: Diagnosis not present

## 2015-04-17 DIAGNOSIS — I10 Essential (primary) hypertension: Secondary | ICD-10-CM | POA: Diagnosis not present

## 2015-04-17 DIAGNOSIS — E785 Hyperlipidemia, unspecified: Secondary | ICD-10-CM

## 2015-04-17 NOTE — Progress Notes (Signed)
BP 139/81 mmHg  Pulse 74  Temp(Src) 97.4 F (36.3 C) (Oral)  Ht 5\' 7"  (1.702 m)  Wt 151 lb (68.493 kg)  BMI 23.64 kg/m2   Subjective:    Patient ID: [name] [name], male    DOB: 07-02-1930, 82 y.o.   MRN: [id number]  HPI: [name] [name] is a 82 y.o. male presenting on 04/17/2015 for Establish Care   HPI Hypertension/hyperlipidemia/chronic CHF Patient is coming here to get primary care physician as recommended by his cardiologist for her normal labs and other issues. His hypertension and hyperlipidemia and chronic CHF of been managed by cardiology for quite some time and he would like him to continue that way. He denies any issues with these at this point and is doing well today.  Skin lesions On exam we noted the patient has multiple skin lesions over his face that some of which could be concerning for precancerous lesions. Discussed this with the patient and he has decided not to pursue when just rather leave them there.  Relevant past medical, surgical, family and social history reviewed and updated as indicated. Interim medical history since our last visit reviewed. Allergies and medications reviewed and updated.  Review of Systems  Constitutional: Negative for fever and chills.  HENT: Negative for ear discharge and ear pain.   Eyes: Negative for discharge and visual disturbance.  Respiratory: Negative for shortness of breath and wheezing.   Cardiovascular: Negative for chest pain and leg swelling.  Gastrointestinal: Negative for abdominal pain, diarrhea and constipation.  Genitourinary: Negative for difficulty urinating.  Musculoskeletal: Negative for back pain and gait problem.  Skin: Positive for rash.  Neurological: Negative for dizziness, syncope, light-headedness and headaches.  All other systems reviewed and are negative.   Per HPI unless specifically indicated above  Social History   Social History  . Marital Status: Married    Spouse Name: N/A  . Number of  Children: 5  . Years of Education: N/A   Occupational History  .     Social History Main Topics  . Smoking status: Former Smoker -- 1.00 packs/day for 43 years    Types: Cigarettes    Quit date: 12/07/1987  . Smokeless tobacco: Not on file  . Alcohol Use: No  . Drug Use: No  . Sexual Activity: Not on file   Other Topics Concern  . Not on file   Social History Narrative    Past Surgical History  Procedure Laterality Date  . Bi-ventricular implantable cardioverter defibrillator  (crt-d)  2005; 2015    STJ CRTD implanted by Dr Caryl Comes  . Coronary artery bypass graft  1989  . Bi-ventricular implantable cardioverter defibrillator upgrade N/A 12/13/2013    Procedure: BI-VENTRICULAR IMPLANTABLE CARDIOVERTER DEFIBRILLATOR UPGRADE;  Surgeon: [name], MD;  Location: [facility] CATH LAB;  Service: Cardiovascular;  Laterality: N/A;    Family History  Problem Relation Age of Onset  . Heart failure Mother       Medication List       This list is accurate as of: 04/17/15  3:04 PM.  Always use your most recent med list.               acetaminophen 500 MG tablet  Commonly known as:  TYLENOL  Take 1,000 mg by mouth daily as needed for headache.     aspirin 81 MG tablet  Take 81 mg by mouth daily.     furosemide 40 MG tablet  Commonly known as:  LASIX  TAKE ONE TABLET BY MOUTH ONCE DAILY     losartan 50 MG tablet  Commonly known as:  COZAAR  Take 1 tablet (50 mg total) by mouth daily.     multivitamin with minerals tablet  Take 1 tablet by mouth daily.     nitroGLYCERIN 0.4 MG SL tablet  Commonly known as:  NITROSTAT  Place 1 tablet (0.4 mg total) under the tongue every 5 (five) minutes as needed for chest pain.     spironolactone 25 MG tablet  Commonly known as:  ALDACTONE  TAKE ONE-HALF TABLET BY MOUTH ONCE DAILY           Objective:    BP 139/81 mmHg  Pulse 74  Temp(Src) 97.4 F (36.3 C) (Oral)  Ht 5\' 7"  (1.702 m)  Wt 151 lb (68.493 kg)  BMI 23.64 kg/m2    Wt Readings from Last 3 Encounters:  04/17/15 151 lb (68.493 kg)  01/03/15 150 lb 6.4 oz (68.221 kg)  07/05/14 153 lb 9.6 oz (69.673 kg)    Physical Exam  Constitutional: He is oriented to person, place, and time. He appears well-developed and well-nourished. No distress.  Eyes: Conjunctivae and EOM are normal. Pupils are equal, round, and reactive to light. Right eye exhibits no discharge. No scleral icterus.  Neck: Neck supple.  Cardiovascular: Normal rate, regular rhythm, normal heart sounds and intact distal pulses.   No murmur heard. Pulmonary/Chest: Effort normal. No respiratory distress. He has no wheezes. He has no rales.  Abdominal: He exhibits no distension.  Musculoskeletal: Normal range of motion. He exhibits no edema.  Neurological: He is alert and oriented to person, place, and time. Coordination normal.  Skin: Skin is warm and dry. Rash (Patient has multiple small hyperkeratotic lesions on his face that could be concerning for precancerous/squamous cell presentation) noted. He is not diaphoretic.  Psychiatric: He has a normal mood and affect. His behavior is normal.  Nursing note and vitals reviewed.       Assessment & Plan:   Problem List Items Addressed This Visit      Cardiovascular and Mediastinum   Essential hypertension    Mostly managed by cardiology, patient here to establish with a PCP for other issues      SYSTOLIC HEART FAILURE, CHRONIC - Primary    Mostly managed by cardiology, patient here to establish with a PCP for other issues        Other   Hyperlipidemia LDL goal <100    Mostly managed by cardiology, patient here to establish with a PCP for other issues       Other Visit Diagnoses    Skin lesion of face        Patient has a few skin lesions on his face that are concerning for squamous cell. He did not want to go see a dermatologist and is content with leaving them        Follow up plan: Return in about 1 year (around 04/16/2016), or  if symptoms worsen or fail to improve.  Caryl Pina, MD [facility] 04/17/2015, 3:04 PM

## 2015-04-20 NOTE — Assessment & Plan Note (Signed)
Mostly managed by cardiology, patient here to establish with a PCP for other issues

## 2015-04-21 ENCOUNTER — Encounter: Payer: Self-pay | Admitting: Cardiology

## 2015-06-08 ENCOUNTER — Encounter: Payer: Self-pay | Admitting: Physician Assistant

## 2015-06-30 ENCOUNTER — Encounter: Payer: Medicare Other | Admitting: Physician Assistant

## 2015-07-07 ENCOUNTER — Other Ambulatory Visit: Payer: Self-pay | Admitting: Cardiovascular Disease

## 2015-07-13 ENCOUNTER — Other Ambulatory Visit: Payer: Self-pay | Admitting: Cardiovascular Disease

## 2015-07-13 NOTE — Telephone Encounter (Signed)
losartan (COZAAR) 50 MG tablet  Medication   Date: 07/07/2015  Department: [address] Office  Ordering/Authorizing: Burnell Blanks, MD      Order Providers    Prescribing Provider Encounter Provider   Burnell Blanks, MD Burnell Blanks, MD    Medication Detail      Disp Refills Start End     losartan (COZAAR) 50 MG tablet 30 tablet 1 07/07/2015     Sig: TAKE ONE TABLET BY MOUTH ONCE DAILY    Notes to Pharmacy: Please call [phone] to schedule annual appointment thanks.    E-Prescribing Status: Receipt confirmed by pharmacy (07/07/2015 1:34 PM EDT)     Pharmacy    [facility] [place], [place] [phone]

## 2015-08-21 ENCOUNTER — Other Ambulatory Visit: Payer: Self-pay | Admitting: Cardiovascular Disease

## 2015-08-22 ENCOUNTER — Encounter: Payer: Self-pay | Admitting: Physician Assistant

## 2015-08-22 ENCOUNTER — Ambulatory Visit (INDEPENDENT_AMBULATORY_CARE_PROVIDER_SITE_OTHER): Payer: Medicare Other | Admitting: Physician Assistant

## 2015-08-22 VITALS — BP 114/80 | HR 70 | Ht 67.0 in | Wt 148.0 lb

## 2015-08-22 DIAGNOSIS — I429 Cardiomyopathy, unspecified: Secondary | ICD-10-CM | POA: Diagnosis not present

## 2015-08-22 DIAGNOSIS — Z79899 Other long term (current) drug therapy: Secondary | ICD-10-CM | POA: Diagnosis not present

## 2015-08-22 DIAGNOSIS — I10 Essential (primary) hypertension: Secondary | ICD-10-CM

## 2015-08-22 DIAGNOSIS — I2510 Atherosclerotic heart disease of native coronary artery without angina pectoris: Secondary | ICD-10-CM

## 2015-08-22 DIAGNOSIS — I5022 Chronic systolic (congestive) heart failure: Secondary | ICD-10-CM

## 2015-08-22 DIAGNOSIS — I472 Ventricular tachycardia: Secondary | ICD-10-CM

## 2015-08-22 DIAGNOSIS — I4729 Other ventricular tachycardia: Secondary | ICD-10-CM

## 2015-08-22 LAB — LIPID PANEL
CHOL/HDL RATIO: 6.2 ratio — AB (ref <=5.0)
CHOLESTEROL: 267 mg/dL — AB (ref 125–200)
HDL: 43 mg/dL (ref >=40)
LDL CALC: 195 mg/dL — AB (ref <130)
TRIGLYCERIDES: 145 mg/dL (ref <150)
VLDL: 29 mg/dL (ref <30)

## 2015-08-22 LAB — MAGNESIUM: Magnesium: 2.4 mg/dL (ref 1.5–2.5)

## 2015-08-22 LAB — BASIC METABOLIC PANEL
BUN: 19 mg/dL (ref 7–25)
CALCIUM: 9.6 mg/dL (ref 8.6–10.3)
CO2: 28 mmol/L (ref 20–31)
Chloride: 101 mmol/L (ref 98–110)
Creat: 1.27 mg/dL — ABNORMAL HIGH (ref 0.70–1.11)
GLUCOSE: 90 mg/dL (ref 65–99)
Potassium: 4.1 mmol/L (ref 3.5–5.3)
SODIUM: 138 mmol/L (ref 135–146)

## 2015-08-22 MED ORDER — FUROSEMIDE 40 MG PO TABS: 40.0000 mg | ORAL_TABLET | Freq: Every day | ORAL | Status: DC

## 2015-08-22 NOTE — Progress Notes (Signed)
Cardiology Office Note Date:  08/22/2015  Patient ID:  [name], [name] Sep 25, 1930, MRN [id number] PCP:  [name] Rancher, MD  Cardiologist:  Dr. Julianne Handler Electrophysiologist: Dr. Caryl Comes   Chief Complaint: regular check in visit  History of Present Illness: [name] [name] is a 82 y.o. male with history of ICM, CHF (systolic) CRT-D in place, CAD (CABG), HTN, hypothyroidism, HLD.  He comes in today to be seen for Dr. Caryl Comes, last vcisit with him was in October and doing pretty well, noting increase in PVC's at that time. He saw Dr. Julianne Handler April 2016 doing well at that time  [name] January recommended investigation of skin lesions concerning for possible pre-cancerous but patient declined. He reports no labs being done.  He feels very well today, no CP, palpitations or SOB, no dizziness, near syncope or syncope, no shocks from his device.  He reports that he sleeps well, denies symptoms of PND or orthopnea, says he does all the activities around the house he wants/needs to without difficulty and walks on the treadmill every day with changes in his exertional capacity.   Past Medical History  Diagnosis Date  . COPD (chronic obstructive pulmonary disease) ([place])   . CAD (coronary artery disease)     status post coronary bypass graft surgery in 1989 with documented occulsion of the vein graft to the diagonal branch of the left anterior descending in 2001.  . Ischemic cardiomyopathy     ejection fraction of 30% improved 35-40% 5/09  . Systolic congestive heart failure (HCC)     Class III systolic congestive heart, improved to class II, euvolemic.  Marland Kitchen Hyperlipidemia   . Hypertension     Past Surgical History  Procedure Laterality Date  . Bi-ventricular implantable cardioverter defibrillator  (crt-d)  2005; 2015    STJ CRTD implanted by Dr Caryl Comes  . Coronary artery bypass graft  1989  . Bi-ventricular implantable cardioverter defibrillator upgrade N/A 12/13/2013    Procedure:  BI-VENTRICULAR IMPLANTABLE CARDIOVERTER DEFIBRILLATOR UPGRADE;  Surgeon: [name], MD;  Location: [facility] CATH LAB;  Service: Cardiovascular;  Laterality: N/A;    Current Outpatient Prescriptions  Medication Sig Dispense Refill  . acetaminophen (TYLENOL) 500 MG tablet Take 500 mg by mouth every 6 (six) hours as needed for headache.     Marland Kitchen aspirin 81 MG tablet Take 81 mg by mouth daily.     . furosemide (LASIX) 40 MG tablet TAKE ONE TABLET BY MOUTH ONCE DAILY 30 tablet 11  . losartan (COZAAR) 50 MG tablet TAKE ONE TABLET BY MOUTH ONCE DAILY 30 tablet 1  . Multiple Vitamins-Minerals (MULTIVITAMIN WITH MINERALS) tablet Take 1 tablet by mouth daily.     . nitroGLYCERIN (NITROSTAT) 0.4 MG SL tablet Place 1 tablet (0.4 mg total) under the tongue every 5 (five) minutes as needed for chest pain. 25 tablet 6  . spironolactone (ALDACTONE) 25 MG tablet TAKE ONE-HALF TABLET BY MOUTH ONCE DAILY 45 tablet 3   No current facility-administered medications for this visit.    Allergies:   Pollen extract; Ramipril; and Crestor   Social History:  The patient  reports that he quit smoking about 27 years ago. His smoking use included Cigarettes. He has a 43 pack-year smoking history. He does not have any smokeless tobacco history on file. He reports that he does not drink alcohol or use illicit drugs.   Family History:  The patient's family history includes Heart failure in his mother.  ROS:  Please see the history  of present illness.   All other systems are reviewed and otherwise negative.   PHYSICAL EXAM:  VS:  BP 114/80 mmHg  Pulse 70  Ht 5\' 7"  (1.702 m)  Wt 148 lb (67.132 kg)  BMI 23.17 kg/m2 BMI: Body mass index is 23.17 kg/(m^2). Thin, though well nourished, well developed, in no acute distress HEENT: normocephalic, atraumatic Neck: no JVD, carotid bruits or masses Cardiac:  normal S1, S2; RRR; no significant murmurs, no rubs, or gallops Lungs:  clear to auscultation bilaterally, no wheezing,  rhonchi or rales Abd: soft, nontender MS: no deformity , age appropriate atrophy Ext: no edema Skin: warm and dry, no rash, erythematous plaque area inferior to his device, this the patient reports unchanged for years,  Neuro:  No gross deficits appreciated Psych: euthymic mood, full affect  ICD site is stable, no tethering or discomfort   EKG:  Done today shows AV paced, PVCs/couplet ICD check : battery status is good, RV thresholds increased from prior, 2 NSVT episodes since last remote, one appears to have started below detection, unclear the actual duration of this episode.  92% BiVe pacing (93% is LV only)  05/09/09: Echocardiogram Study Conclusions - Left ventricle: There is akinesis of the inferior and posterior  walls. There is aneurysmal dilitation of the base of the inferior  wall. The EF is in the 30% range. - Aortic valve: Sclerosis without stenosis. - Mitral valve: Mild regurgitation. - Right ventricle: Pacer wire or catheter noted in right ventricle.  catheterization in July of 2011. He had total occlusion of the vein graft to the right coronary artery and total occlusion of the vein graft to the diagonal branch Stress myoview without ischemia Sept 2015  Recent Labs: No results found for requested labs within last 365 days.  No results found for requested labs within last 365 days.   CrCl cannot be calculated (Patient has no serum creatinine result on file.).   Wt Readings from Last 3 Encounters:  08/22/15 148 lb (67.132 kg)  04/17/15 151 lb (68.493 kg)  01/03/15 150 lb 6.4 oz (68.221 kg)     Other studies reviewed: Additional studies/records reviewed today include: summarized above  DEVICE information: MDT CRT-D, original device PPM 2001,  upgraded to CRT-D 2009, gen change 2015, Dr. Caryl Comes  ASSESSMENT AND PLAN:   1. CRT-D     Hx of ATP for VT historically, no shocks     No AAD hx     Interrogation findings above, and reprogrammed as noted  below  2.  ICM, chronic CHF      Exam appears well compensated with no evidence of fluid OL      He is on lasix and ARB, his list no longer includes BB, unclear when/why stopped  3. HTN     Stable, follow  4. HLD     Not on a statin, he reports severe myalgias/cramping with at least 2 medicines historically (appears Crestor and zocor)      He reports labs done with PMD      resistent to trying another  5. CAD     On ASA, has PRN s/l NTG though reports never having to use     No anginal c/o  Disposition: Reviewed with Dr. Caryl Comes, monitor zone is added, reprogrammed RV pacing RV tip to coil.  I had a lengthy discussion regarding trying another statin for his CAD and adding/resuming BB to his tx for his NSVT episodes and CM and CAD, though he is  very reluctant, and ultimately unagreeable to making any changes at this time.  stating he really feels well, his wife adds that her medicines were changed  Some months ago by her MD and she has never felt worse.  We will get BMET, mag level and lipid profile.   Current medicines are reviewed at length with the patient today.  The patient did not have any concerns regarding medicines.  [name] Lasso, PA-C 08/22/2015 2:11 PM     [facility] [address] [place] [address] [phone] (office)  [phone] (fax)

## 2015-08-22 NOTE — Patient Instructions (Signed)
Medication Instructions:   Your physician recommends that you continue on your current medications as directed. Please refer to the Current Medication list given to you today.   If you need a refill on your cardiac medications before your next appointment, please call your pharmacy.  Labwork:  BMET MAG AND  LIPID    Testing/Procedures:  NONE ORDER TODAY    Follow-Up:  WITH URSUY IN ONE MONTH    Any Other Special Instructions Will Be Listed Below (If Applicable).

## 2015-08-24 ENCOUNTER — Telehealth: Payer: Self-pay | Admitting: *Deleted

## 2015-08-24 NOTE — Telephone Encounter (Signed)
-----   Message from [place], [place] sent at 08/23/2015  7:33 AM EDT ----- Please let the patient know his potassium, magnesium kidney function are fine, his cholesterol is high and will need to continue to discuss revisiting medicine for this at his next visit.  Thanks [employer]

## 2015-08-31 ENCOUNTER — Telehealth: Payer: Self-pay | Admitting: *Deleted

## 2015-08-31 NOTE — Telephone Encounter (Signed)
-----   Message from [place], [place] sent at 08/23/2015  7:33 AM EDT ----- Please let the patient know his potassium, magnesium kidney function are fine, his cholesterol is high and will need to continue to discuss revisiting medicine for this at his next visit.  Thanks [employer]

## 2015-09-19 ENCOUNTER — Other Ambulatory Visit: Payer: Self-pay | Admitting: Cardiovascular Disease

## 2015-10-11 ENCOUNTER — Other Ambulatory Visit: Payer: Self-pay | Admitting: Internal Medicine

## 2015-10-30 ENCOUNTER — Encounter: Payer: Self-pay | Admitting: Internal Medicine

## 2015-10-30 ENCOUNTER — Ambulatory Visit (HOSPITAL_COMMUNITY)
Admission: RE | Admit: 2015-10-30 | Discharge: 2015-10-30 | Disposition: A | Discharge status: Home / Self Care | Payer: Medicare Other | Source: Ambulatory Visit | Attending: Internal Medicine | Admitting: Internal Medicine

## 2015-10-30 ENCOUNTER — Ambulatory Visit (INDEPENDENT_AMBULATORY_CARE_PROVIDER_SITE_OTHER): Payer: Medicare Other | Admitting: *Deleted

## 2015-10-30 ENCOUNTER — Telehealth: Payer: Self-pay | Admitting: Internal Medicine

## 2015-10-30 DIAGNOSIS — I5022 Chronic systolic (congestive) heart failure: Secondary | ICD-10-CM | POA: Diagnosis not present

## 2015-10-30 DIAGNOSIS — Z95810 Presence of automatic (implantable) cardiac defibrillator: Secondary | ICD-10-CM

## 2015-10-30 DIAGNOSIS — I472 Ventricular tachycardia: Secondary | ICD-10-CM

## 2015-10-30 DIAGNOSIS — I4729 Other ventricular tachycardia: Secondary | ICD-10-CM

## 2015-10-30 DIAGNOSIS — I429 Cardiomyopathy, unspecified: Secondary | ICD-10-CM

## 2015-10-30 DIAGNOSIS — R079 Chest pain, unspecified: Secondary | ICD-10-CM | POA: Diagnosis not present

## 2015-10-30 LAB — CUP PACEART INCLINIC DEVICE CHECK
Battery Remaining Longevity: 89 mo
Brady Statistic AP VS Percent: 1.61 %
Brady Statistic AS VS Percent: 5.68 %
Brady Statistic RA Percent Paced: 43.83 %
Brady Statistic RV Percent Paced: 15.54 %
Date Time Interrogation Session: 20170807172735
HighPow Impedance: 48 Ohm
HighPow Impedance: 70 Ohm
Implantable Lead Implant Date: 20010823
Implantable Lead Implant Date: 20010823
Implantable Lead Implant Date: 20091119
Implantable Lead Location: 753858
Implantable Lead Location: 753859
Implantable Lead Model: 6940
Lead Channel Impedance Value: 380 Ohm
Lead Channel Impedance Value: 627 Ohm
Lead Channel Impedance Value: 665 Ohm
Lead Channel Pacing Threshold Pulse Width: 0.4 ms
Lead Channel Sensing Intrinsic Amplitude: 1.625 mV
Lead Channel Sensing Intrinsic Amplitude: 2 mV
Lead Channel Sensing Intrinsic Amplitude: 3.875 mV
Lead Channel Setting Pacing Amplitude: 1.5 V
Lead Channel Setting Sensing Sensitivity: 0.3 mV
MDC IDC LEAD LOCATION: 753860
MDC IDC LEAD SERIAL: 110961
MDC IDC MSMT BATTERY VOLTAGE: 2.96 V
MDC IDC MSMT LEADCHNL LV IMPEDANCE VALUE: 1159 Ohm
MDC IDC MSMT LEADCHNL LV PACING THRESHOLD AMPLITUDE: 0.5 V
MDC IDC MSMT LEADCHNL LV PACING THRESHOLD PULSEWIDTH: 0.4 ms
MDC IDC MSMT LEADCHNL RA IMPEDANCE VALUE: 494 Ohm
MDC IDC MSMT LEADCHNL RA PACING THRESHOLD AMPLITUDE: 0.75 V
MDC IDC MSMT LEADCHNL RV IMPEDANCE VALUE: 247 Ohm
MDC IDC MSMT LEADCHNL RV PACING THRESHOLD AMPLITUDE: 1.75 V
MDC IDC MSMT LEADCHNL RV PACING THRESHOLD PULSEWIDTH: 1 ms
MDC IDC MSMT LEADCHNL RV SENSING INTR AMPL: 2.875 mV
MDC IDC SET LEADCHNL LV PACING PULSEWIDTH: 0.4 ms
MDC IDC SET LEADCHNL RA PACING AMPLITUDE: 1.5 V
MDC IDC STAT BRADY AP VP PERCENT: 42.22 %
MDC IDC STAT BRADY AS VP PERCENT: 50.49 %

## 2015-10-30 NOTE — Telephone Encounter (Signed)
Called pts wife back and let her know that we received the transmission. Pts wife described the noise as an ambulance noise and stated that it went off at the same time. Informed pts wife that a measurement was out of range and that he would need to come to the office today. Pt has an apt at 2:00pm for device clinic. Pts wife voiced understanding.

## 2015-10-30 NOTE — Telephone Encounter (Signed)
Pt wife called and stated that pt device was making a noise. Instructed her to send a remote transmission. A device tech would review and call her back. Pt wife verbalized understanding.

## 2015-10-30 NOTE — Progress Notes (Signed)
CRT-D device check in office for patient notifier. Thresholds and sensing consistent with previous device measurements. RA/RV lead impedance trends stable over time. LV lead impedance >1000ohms x 2--informed WC who recommended that upper limit be increased to 1500ohms. CXR also ordered per WC. No mode switch episodes recorded. (9) brief ventricular arrhythmia episodes recorded. Patient bi-ventricularly pacing 87.9% of the time with 6.6% as VSRp--frequent PVCs. Device programmed with appropriate safety margins. Heart failure diagnostics reviewed and trends are stable for patient. Audible alerts demonstrated for patient. Estimated longevity 7.3 years.  Patient will follow up with [facility] on 8/8 (overdue w/RU; patient prefers [facility]). Patient education completed including shock plan.

## 2015-10-30 NOTE — Telephone Encounter (Signed)
error 

## 2015-10-31 ENCOUNTER — Ambulatory Visit (INDEPENDENT_AMBULATORY_CARE_PROVIDER_SITE_OTHER): Payer: Medicare Other | Admitting: Internal Medicine

## 2015-10-31 ENCOUNTER — Encounter: Payer: Self-pay | Admitting: Internal Medicine

## 2015-10-31 ENCOUNTER — Encounter (INDEPENDENT_AMBULATORY_CARE_PROVIDER_SITE_OTHER): Payer: Self-pay

## 2015-10-31 VITALS — BP 130/70 | HR 86 | Ht 67.0 in | Wt 148.4 lb

## 2015-10-31 DIAGNOSIS — I255 Ischemic cardiomyopathy: Secondary | ICD-10-CM | POA: Diagnosis not present

## 2015-10-31 DIAGNOSIS — I472 Ventricular tachycardia, unspecified: Secondary | ICD-10-CM

## 2015-10-31 DIAGNOSIS — I5022 Chronic systolic (congestive) heart failure: Secondary | ICD-10-CM

## 2015-10-31 DIAGNOSIS — Z95810 Presence of automatic (implantable) cardiac defibrillator: Secondary | ICD-10-CM

## 2015-10-31 DIAGNOSIS — I493 Ventricular premature depolarization: Secondary | ICD-10-CM | POA: Diagnosis not present

## 2015-10-31 MED ORDER — AMIODARONE HCL 400 MG PO TABS: ORAL_TABLET | ORAL | 0 refills | Status: DC

## 2015-10-31 NOTE — Patient Instructions (Signed)
Medication Instructions: - Your physician has recommended you make the following change in your medication:  1) Start amiodarone 400 mg one tablet by mouth twice daily x 2 weeks, then 2) Decrease amiodarone to 400 mg one tablet by mouth once daily x 2 weeks, then 3) Decrease amiodarone to 200 mg one tablet by mouth once daily   Labwork: - none  Procedures/Testing: - Your physician has requested that you have an echocardiogram- in about 5 weeks. Echocardiography is a painless test that uses sound waves to create images of your heart. It provides your doctor with information about the size and shape of your heart and how well your heart's chambers and valves are working. This procedure takes approximately one hour. There are no restrictions for this procedure.  Follow-Up: - Your physician recommends that you schedule a follow-up appointment in: about 6 weeks with Dr. Caryl Comes.   Any Additional Special Instructions Will Be Listed Below (If Applicable).     If you need a refill on your cardiac medications before your next appointment, please call your pharmacy.

## 2015-10-31 NOTE — Progress Notes (Signed)
Patient Care Team: [name], MD as PCP - General (Family Medicine)   HPI  [name] [name] is a 82 y.o. male seen in followup for Ischemic cardiomyopathy congestive heart failure for which he is s/p CRT-D implantation. He is s/p CABG and most recent EF was about 30-35% in fall of 2009.   He has stable low amplitude R waves  He says his breathing is much better since his AV optimization echo; he is hoping that there is greater benefit to be gained.    His device reached ERI 9/15 he underwent generator replacement at that time choosing to have CRT-D implanted     He is now married about a 5 years.  He underwent catheterization in July of 2011. He had total occlusion of the vein graft to the right coronary artery and total occlusion of the vein graft to the diagonal branch. Ejection fraction was 40%.  SR  He has struggled with fatigue. He has significant breathlessness. It is his and his wife's impression that is worsening. He's had this enormous PVC burden (15-20%) and has been in the past reluctant to undertake therapy for this.  He had intercurrent ventricular tachycardia treated with antitachycardia pacing; the patient was unaware of this.   Last labs 3/15 showed K 4.9  No mag    Past Surgical History:  Procedure Laterality Date  . BI-VENTRICULAR IMPLANTABLE CARDIOVERTER DEFIBRILLATOR  (CRT-D)  2005; 2015   STJ CRTD implanted by Dr Caryl Comes  . BI-VENTRICULAR IMPLANTABLE CARDIOVERTER DEFIBRILLATOR UPGRADE N/A 12/13/2013   Procedure: BI-VENTRICULAR IMPLANTABLE CARDIOVERTER DEFIBRILLATOR UPGRADE;  Surgeon: [name], MD;  Location: [facility] CATH LAB;  Service: Cardiovascular;  Laterality: N/A;  . CORONARY ARTERY BYPASS GRAFT  1989    Current Outpatient Prescriptions  Medication Sig Dispense Refill  . acetaminophen (TYLENOL) 500 MG tablet Take 500 mg by mouth every 6 (six) hours as needed for headache.     Marland Kitchen aspirin 81 MG tablet Take 81 mg by mouth daily.     .  furosemide (LASIX) 40 MG tablet Take 1 tablet (40 mg total) by mouth daily. 30 tablet 6  . losartan (COZAAR) 50 MG tablet TAKE ONE TABLET BY MOUTH ONCE DAILY 30 tablet 10  . Multiple Vitamins-Minerals (MULTIVITAMIN WITH MINERALS) tablet Take 1 tablet by mouth daily.     . nitroGLYCERIN (NITROSTAT) 0.4 MG SL tablet Place 1 tablet (0.4 mg total) under the tongue every 5 (five) minutes as needed for chest pain. 25 tablet 6  . spironolactone (ALDACTONE) 25 MG tablet TAKE ONE-HALF TABLET BY MOUTH ONCE DAILY 45 tablet 3   No current facility-administered medications for this visit.     Allergies  Allergen Reactions  . Pollen Extract   . Ramipril Hives and Swelling  . Crestor [Rosuvastatin] Anxiety    Review of Systems negative except from HPI and PMH  Physical Exam BP 130/70 (BP Location: Left Arm, Patient Position: Sitting, Cuff Size: Normal)   Pulse 86   Ht 5\' 7"  (1.702 m)   Wt 148 lb 6.4 oz (67.3 kg)   BMI 23.24 kg/m  Well developed and nourished in no acute distress HENT normal Neck supple with JVP8-9 Clear Device pocket well healed; without hematoma or erythema.  There is a superficial erythematous plaque at the base distal caudal aspect of his defibrillator. There is no tethering The patient's device was interrogated.  The information was reviewed. No changes were made in the programming.   Regular rate and  rhythm, no murmurs or gallops Abd-soft with active BS No Clubbing cyanosis tr edema Skin-warm and dry A & Oriented  Grossly normal sensory and motor function   ECG demonstrates P. synchronous pacing with frequent PVCs   Assessment and  Plan  Ischemic cardiomyopathy  Congestive heart failure-chronic-systolic  CRT-D- Medtronic The patient's device was interrogated.  The information was reviewed. No changes were made in the programming.  PVCs     Without symptoms of ischemia  There is evidence of volume overload.  He continues with frequent PVCs. This has been  a ongoing problem and he has been reluctant in the past to take medication.  We have discussed amiodaraone and will begin 400 bid x 2 weeks then 400 qd x 2 weeks then 200 daily I will see him afte that and will anticipate ECG CRT optimization  We will get an echo at that point

## 2015-11-02 LAB — CUP PACEART INCLINIC DEVICE CHECK
Battery Remaining Longevity: 88 mo
Brady Statistic AP VS Percent: 1.25 %
Brady Statistic RA Percent Paced: 44.83 %
HighPow Impedance: 53 Ohm
HighPow Impedance: 77 Ohm
Implantable Lead Implant Date: 20010823
Implantable Lead Implant Date: 20091119
Implantable Lead Location: 753858
Implantable Lead Location: 753859
Implantable Lead Model: 148
Implantable Lead Serial Number: 110961
Lead Channel Impedance Value: 380 Ohm
Lead Channel Impedance Value: 703 Ohm
Lead Channel Pacing Threshold Amplitude: 1.75 V
Lead Channel Pacing Threshold Pulse Width: 0.4 ms
Lead Channel Pacing Threshold Pulse Width: 0.4 ms
Lead Channel Sensing Intrinsic Amplitude: 1.75 mV
Lead Channel Sensing Intrinsic Amplitude: 2.125 mV
Lead Channel Sensing Intrinsic Amplitude: 4.125 mV
Lead Channel Setting Pacing Amplitude: 1.5 V
Lead Channel Setting Pacing Pulse Width: 1 ms
MDC IDC LEAD IMPLANT DT: 20010823
MDC IDC LEAD LOCATION: 753860
MDC IDC LEAD MODEL: 6940
MDC IDC MSMT BATTERY VOLTAGE: 2.94 V
MDC IDC MSMT LEADCHNL LV IMPEDANCE VALUE: 1083 Ohm
MDC IDC MSMT LEADCHNL LV IMPEDANCE VALUE: 513 Ohm
MDC IDC MSMT LEADCHNL LV PACING THRESHOLD AMPLITUDE: 0.5 V
MDC IDC MSMT LEADCHNL RA IMPEDANCE VALUE: 532 Ohm
MDC IDC MSMT LEADCHNL RA PACING THRESHOLD AMPLITUDE: 1.75 V
MDC IDC MSMT LEADCHNL RV IMPEDANCE VALUE: 266 Ohm
MDC IDC MSMT LEADCHNL RV PACING THRESHOLD PULSEWIDTH: 0.4 ms
MDC IDC MSMT LEADCHNL RV SENSING INTR AMPL: 2.875 mV
MDC IDC SESS DTM: 20170808204542
MDC IDC SET LEADCHNL LV PACING AMPLITUDE: 1.5 V
MDC IDC SET LEADCHNL LV PACING PULSEWIDTH: 0.4 ms
MDC IDC SET LEADCHNL RV PACING AMPLITUDE: 3.5 V
MDC IDC SET LEADCHNL RV SENSING SENSITIVITY: 0.3 mV
MDC IDC STAT BRADY AP VP PERCENT: 43.58 %
MDC IDC STAT BRADY AS VP PERCENT: 51.75 %
MDC IDC STAT BRADY AS VS PERCENT: 3.42 %
MDC IDC STAT BRADY RV PERCENT PACED: 18.48 %

## 2015-11-03 ENCOUNTER — Telehealth: Payer: Self-pay | Admitting: Internal Medicine

## 2015-11-03 ENCOUNTER — Telehealth: Payer: Self-pay | Admitting: *Deleted

## 2015-11-03 NOTE — Telephone Encounter (Signed)
Pt notified of results Verbalizes understanding 

## 2015-11-03 NOTE — Telephone Encounter (Signed)
-----   Message from [name], MD sent at 11/02/2015  9:43 PM EDT ----- Please notify patient that radiology results were normal.

## 2015-11-03 NOTE — Telephone Encounter (Signed)
I spoke with the patient. 

## 2015-11-03 NOTE — Telephone Encounter (Signed)
New Message  Pt verbalized Nurse Nira Conn called pt about his defib and pt verbalized she could barely understand what was left on her vm.  Please follow up with pt.

## 2015-11-22 ENCOUNTER — Other Ambulatory Visit: Payer: Self-pay | Admitting: *Deleted

## 2015-11-22 MED ORDER — AMIODARONE HCL 200 MG PO TABS: 200.0000 mg | ORAL_TABLET | Freq: Every day | ORAL | 6 refills | Status: DC

## 2015-11-28 ENCOUNTER — Ambulatory Visit (HOSPITAL_COMMUNITY): Payer: Medicare Other | Attending: Cardiovascular Disease

## 2015-11-28 ENCOUNTER — Other Ambulatory Visit: Payer: Self-pay

## 2015-11-28 DIAGNOSIS — I255 Ischemic cardiomyopathy: Secondary | ICD-10-CM | POA: Diagnosis not present

## 2015-11-28 DIAGNOSIS — I5022 Chronic systolic (congestive) heart failure: Secondary | ICD-10-CM | POA: Insufficient documentation

## 2015-11-28 DIAGNOSIS — Z951 Presence of aortocoronary bypass graft: Secondary | ICD-10-CM | POA: Diagnosis not present

## 2015-11-28 DIAGNOSIS — I472 Ventricular tachycardia, unspecified: Secondary | ICD-10-CM

## 2015-11-28 DIAGNOSIS — I350 Nonrheumatic aortic (valve) stenosis: Secondary | ICD-10-CM | POA: Insufficient documentation

## 2015-12-01 ENCOUNTER — Encounter: Payer: Self-pay | Admitting: Internal Medicine

## 2015-12-15 ENCOUNTER — Encounter: Payer: Self-pay | Admitting: Internal Medicine

## 2015-12-15 ENCOUNTER — Ambulatory Visit (INDEPENDENT_AMBULATORY_CARE_PROVIDER_SITE_OTHER): Payer: Medicare Other | Admitting: Internal Medicine

## 2015-12-15 VITALS — BP 130/60 | HR 77 | Ht 67.0 in | Wt 144.8 lb

## 2015-12-15 DIAGNOSIS — Z95810 Presence of automatic (implantable) cardiac defibrillator: Secondary | ICD-10-CM | POA: Diagnosis not present

## 2015-12-15 DIAGNOSIS — I493 Ventricular premature depolarization: Secondary | ICD-10-CM

## 2015-12-15 DIAGNOSIS — I255 Ischemic cardiomyopathy: Secondary | ICD-10-CM | POA: Diagnosis not present

## 2015-12-15 DIAGNOSIS — I5022 Chronic systolic (congestive) heart failure: Secondary | ICD-10-CM

## 2015-12-15 LAB — CUP PACEART INCLINIC DEVICE CHECK
Battery Remaining Longevity: 83 mo
Battery Voltage: 2.96 V
Brady Statistic AP VP Percent: 55.74 %
Brady Statistic AS VS Percent: 0.7 %
Date Time Interrogation Session: 20170922172847
HIGH POWER IMPEDANCE MEASURED VALUE: 53 Ohm
HIGH POWER IMPEDANCE MEASURED VALUE: 76 Ohm
Implantable Lead Location: 753860
Implantable Lead Model: 148
Implantable Lead Model: 6940
Implantable Lead Serial Number: 110961
Lead Channel Impedance Value: 1159 Ohm
Lead Channel Impedance Value: 266 Ohm
Lead Channel Impedance Value: 399 Ohm
Lead Channel Pacing Threshold Amplitude: 0.5 V
Lead Channel Pacing Threshold Amplitude: 2 V
Lead Channel Pacing Threshold Pulse Width: 0.4 ms
Lead Channel Pacing Threshold Pulse Width: 1 ms
Lead Channel Sensing Intrinsic Amplitude: 3.625 mV
Lead Channel Setting Pacing Amplitude: 1.75 V
Lead Channel Setting Pacing Amplitude: 2.5 V
Lead Channel Setting Pacing Pulse Width: 0.03 ms
Lead Channel Setting Pacing Pulse Width: 0.4 ms
MDC IDC LEAD IMPLANT DT: 20010823
MDC IDC LEAD IMPLANT DT: 20010823
MDC IDC LEAD IMPLANT DT: 20091119
MDC IDC LEAD LOCATION: 753858
MDC IDC LEAD LOCATION: 753859
MDC IDC MSMT LEADCHNL LV IMPEDANCE VALUE: 570 Ohm
MDC IDC MSMT LEADCHNL LV IMPEDANCE VALUE: 760 Ohm
MDC IDC MSMT LEADCHNL RA IMPEDANCE VALUE: 494 Ohm
MDC IDC MSMT LEADCHNL RA PACING THRESHOLD AMPLITUDE: 1.5 V
MDC IDC MSMT LEADCHNL RA PACING THRESHOLD PULSEWIDTH: 0.8 ms
MDC IDC MSMT LEADCHNL RA SENSING INTR AMPL: 1.875 mV
MDC IDC SET LEADCHNL RV PACING AMPLITUDE: 0.5 V
MDC IDC SET LEADCHNL RV SENSING SENSITIVITY: 0.3 mV
MDC IDC STAT BRADY AP VS PERCENT: 0.46 %
MDC IDC STAT BRADY AS VP PERCENT: 43.1 %
MDC IDC STAT BRADY RA PERCENT PACED: 56.2 %
MDC IDC STAT BRADY RV PERCENT PACED: 63.64 %

## 2015-12-15 MED ORDER — NITROGLYCERIN 0.4 MG SL SUBL: 0.4000 mg | SUBLINGUAL_TABLET | SUBLINGUAL | 6 refills | Status: DC | PRN

## 2015-12-15 NOTE — Patient Instructions (Signed)
Medication Instructions: - Your physician recommends that you continue on your current medications as directed. Please refer to the Current Medication list given to you today.  Labwork: - Your physician recommends that you have lab work today: CMET/ TSH  Procedures/Testing: - none ordered  Follow-Up: - Remote monitoring is used to monitor your Pacemaker of ICD from home. This monitoring reduces the number of office visits required to check your device to one time per year. It allows [place] to keep an eye on the functioning of your device to ensure it is working properly. You are scheduled for a device check from home on 03/19/16. You may send your transmission at any time that day. If you have a wireless device, the transmission will be sent automatically. After your physician reviews your transmission, you will receive a postcard with your next transmission date.  - Your physician wants you to follow-up in: 6 months with Tommye Standard, PA for Dr. Caryl Comes. You will receive a reminder letter in the mail two months in advance. If you don't receive a letter, please call our office to schedule the follow-up appointment.  Any Additional Special Instructions Will Be Listed Below (If Applicable).     If you need a refill on your cardiac medications before your next appointment, please call your pharmacy.

## 2015-12-15 NOTE — Progress Notes (Signed)
Patient Care Team: [name], MD as PCP - General (Family Medicine)   HPI  [name] [name] is a 82 y.o. male seen in followup for Ischemic cardiomyopathy congestive heart failure for which he is s/p CRT-D implantation. He is s/p CABG and most recent EF was about 30-35% in fall of 2009.   He has stable low amplitude R waves  He says his breathing is much better since his AV optimization echo; he is hoping that there is greater benefit to be gained.   His device reached ERI 9/15 he underwent generator replacement at that time choosing to have CRT-D implanted     He has struggled with fatigue. He has significant breathlessness. It is his and his wife's impression that is worsening. He's had this enormous PVC burden (15-20%) and has been in the past reluctant to undertake therapy for this.  More recently we started him on amiodarone. He was unable to tolerate the 40 mg dose but has tolerated 200 mg dose. This is been associated with significant improvement in exercise tolerance.  Patient denies symptoms of GI intolerance, sun sensitivity, neurological symptoms attributable to amiodarone.     He underwent catheterization in July of 2011. He had total occlusion of the vein graft to the right coronary artery and total occlusion of the vein graft to the diagonal branch. Ejection fraction was 40%.  SR       Last labs 3/15 showed K 4.9  No mag    Past Surgical History:  Procedure Laterality Date  . BI-VENTRICULAR IMPLANTABLE CARDIOVERTER DEFIBRILLATOR  (CRT-D)  2005; 2015   STJ CRTD implanted by Dr Caryl Comes  . BI-VENTRICULAR IMPLANTABLE CARDIOVERTER DEFIBRILLATOR UPGRADE N/A 12/13/2013   Procedure: BI-VENTRICULAR IMPLANTABLE CARDIOVERTER DEFIBRILLATOR UPGRADE;  Surgeon: [name], MD;  Location: [facility] CATH LAB;  Service: Cardiovascular;  Laterality: N/A;  . CORONARY ARTERY BYPASS GRAFT  1989    Current Outpatient Prescriptions  Medication Sig Dispense Refill  .  acetaminophen (TYLENOL) 500 MG tablet Take 500 mg by mouth every 6 (six) hours as needed for headache.     Marland Kitchen amiodarone (PACERONE) 200 MG tablet Take 1 tablet (200 mg total) by mouth daily. 30 tablet 6  . aspirin 81 MG tablet Take 81 mg by mouth daily.     . furosemide (LASIX) 40 MG tablet Take 1 tablet (40 mg total) by mouth daily. 30 tablet 6  . losartan (COZAAR) 50 MG tablet TAKE ONE TABLET BY MOUTH ONCE DAILY 30 tablet 10  . Multiple Vitamins-Minerals (MULTIVITAMIN WITH MINERALS) tablet Take 1 tablet by mouth daily.     . nitroGLYCERIN (NITROSTAT) 0.4 MG SL tablet Place 1 tablet (0.4 mg total) under the tongue every 5 (five) minutes as needed for chest pain. 25 tablet 6  . spironolactone (ALDACTONE) 25 MG tablet TAKE ONE-HALF TABLET BY MOUTH ONCE DAILY 45 tablet 3   No current facility-administered medications for this visit.     Allergies  Allergen Reactions  . Pollen Extract   . Ramipril Hives and Swelling  . Crestor [Rosuvastatin] Anxiety    Review of Systems negative except from HPI and PMH  Physical Exam BP 130/60   Pulse 77   Ht 5\' 7"  (1.702 m)   Wt 144 lb 12.8 oz (65.7 kg)   SpO2 94%   BMI 22.68 kg/m  Well developed and nourished in no acute distress HENT normal Neck supple with JVP flat Clear Device pocket well healed; without hematoma or erythema.  There is a superficial erythematous plaque at the base distal caudal aspect of his defibrillator. There is no tethering The patient's device was interrogated.  The information was reviewed. No changes were made in the programming.   Regular rate and rhythm, no murmurs or gallops Abd-soft with active BS No Clubbing cyanosis tr edema Skin-warm and dry A & Oriented  Grossly normal sensory and motor function   ECG demonstrates P. synchronous pacing with no pVCs QRS morphology was negative in lead V1 and positive in lead 1. With multiple appropriate interventions ultimately LV pacing only any AV delay shortened by 20 ms  the QRS morphology became upright in lead V1 overall QRS with increased however from 140 ms to 180 ms.   Assessment and  Plan  Ischemic cardiomyopathy  Congestive heart failure-chronic-systolic  CRT-D- Medtronic The patient's device was interrogated.  The information was reviewed. Programming changes were made as below   PVCs      PVC burden by ECG and device interrogation seems markedly suppressed. Amiodarone surveillance labs will be checked today.  Euvolemic continue current meds  Without symptoms of ischemia  Device reprogramming was ultimately able to accomplish an upright QRS in lead V1 (biphasic) hopefully this will translate into further improvement in functional status.  More than 50% of 45 min was spent in counseling related to the above

## 2015-12-16 LAB — COMPREHENSIVE METABOLIC PANEL
ALT: 12 U/L (ref 9–46)
AST: 14 U/L (ref 10–35)
Albumin: 4.4 g/dL (ref 3.6–5.1)
Alkaline Phosphatase: 44 U/L (ref 40–115)
BUN: 26 mg/dL — AB (ref 7–25)
CALCIUM: 9.7 mg/dL (ref 8.6–10.3)
CHLORIDE: 99 mmol/L (ref 98–110)
CO2: 23 mmol/L (ref 20–31)
Creat: 1.81 mg/dL — ABNORMAL HIGH (ref 0.70–1.11)
Glucose, Bld: 92 mg/dL (ref 65–99)
POTASSIUM: 4.3 mmol/L (ref 3.5–5.3)
Sodium: 135 mmol/L (ref 135–146)
TOTAL PROTEIN: 6.9 g/dL (ref 6.1–8.1)
Total Bilirubin: 0.6 mg/dL (ref 0.2–1.2)

## 2015-12-16 LAB — TSH: TSH: 2.47 m[IU]/L (ref 0.40–4.50)

## 2015-12-20 ENCOUNTER — Other Ambulatory Visit: Payer: Self-pay | Admitting: *Deleted

## 2015-12-20 DIAGNOSIS — I10 Essential (primary) hypertension: Secondary | ICD-10-CM

## 2015-12-26 ENCOUNTER — Ambulatory Visit (INDEPENDENT_AMBULATORY_CARE_PROVIDER_SITE_OTHER): Payer: Medicare Other

## 2015-12-26 DIAGNOSIS — Z23 Encounter for immunization: Secondary | ICD-10-CM | POA: Diagnosis not present

## 2016-01-10 ENCOUNTER — Other Ambulatory Visit: Payer: Medicare Other | Admitting: *Deleted

## 2016-01-10 DIAGNOSIS — I10 Essential (primary) hypertension: Secondary | ICD-10-CM | POA: Diagnosis not present

## 2016-01-10 LAB — COMPREHENSIVE METABOLIC PANEL
ALBUMIN: 4.2 g/dL (ref 3.6–5.1)
ALK PHOS: 58 U/L (ref 40–115)
ALT: 11 U/L (ref 9–46)
AST: 15 U/L (ref 10–35)
BILIRUBIN TOTAL: 0.6 mg/dL (ref 0.2–1.2)
BUN: 19 mg/dL (ref 7–25)
CALCIUM: 9.6 mg/dL (ref 8.6–10.3)
CO2: 27 mmol/L (ref 20–31)
Chloride: 97 mmol/L — ABNORMAL LOW (ref 98–110)
Creat: 1.77 mg/dL — ABNORMAL HIGH (ref 0.70–1.11)
Glucose, Bld: 80 mg/dL (ref 65–99)
Potassium: 4.3 mmol/L (ref 3.5–5.3)
Sodium: 134 mmol/L — ABNORMAL LOW (ref 135–146)
TOTAL PROTEIN: 6.9 g/dL (ref 6.1–8.1)

## 2016-03-13 ENCOUNTER — Telehealth: Payer: Self-pay | Admitting: *Deleted

## 2016-03-13 DIAGNOSIS — I255 Ischemic cardiomyopathy: Secondary | ICD-10-CM

## 2016-03-13 DIAGNOSIS — I5022 Chronic systolic (congestive) heart failure: Secondary | ICD-10-CM

## 2016-03-13 NOTE — Telephone Encounter (Signed)
-----   Message from Emily Filbert, RN sent at 01/24/2016  4:27 PM EDT ----- Needs a repeat BMP around mid December

## 2016-03-13 NOTE — Telephone Encounter (Signed)
I called and spoke with the patient- I advised him he is due or a repeat BMP to look at his kidney function. He is agreeable with this and will come on 12/28 to have this done.

## 2016-03-19 ENCOUNTER — Ambulatory Visit (INDEPENDENT_AMBULATORY_CARE_PROVIDER_SITE_OTHER): Payer: Medicare Other | Admitting: *Deleted

## 2016-03-19 DIAGNOSIS — I255 Ischemic cardiomyopathy: Secondary | ICD-10-CM | POA: Diagnosis not present

## 2016-03-19 DIAGNOSIS — I5022 Chronic systolic (congestive) heart failure: Secondary | ICD-10-CM

## 2016-03-19 NOTE — Progress Notes (Signed)
Remote ICD transmission.   

## 2016-03-20 LAB — CUP PACEART REMOTE DEVICE CHECK
Brady Statistic AP VS Percent: 0 %
Brady Statistic AS VP Percent: 85.51 %
Brady Statistic RA Percent Paced: 14.23 %
Date Time Interrogation Session: 20171226162939
HIGH POWER IMPEDANCE MEASURED VALUE: 47 Ohm
HIGH POWER IMPEDANCE MEASURED VALUE: 64 Ohm
Implantable Lead Implant Date: 20010823
Implantable Lead Implant Date: 20010823
Implantable Lead Implant Date: 20091119
Implantable Lead Location: 753858
Lead Channel Impedance Value: 247 Ohm
Lead Channel Impedance Value: 380 Ohm
Lead Channel Impedance Value: 589 Ohm
Lead Channel Impedance Value: 665 Ohm
Lead Channel Pacing Threshold Pulse Width: 0.4 ms
Lead Channel Pacing Threshold Pulse Width: 0.4 ms
Lead Channel Sensing Intrinsic Amplitude: 2.75 mV
Lead Channel Setting Pacing Amplitude: 2.5 V
Lead Channel Setting Pacing Pulse Width: 0.03 ms
Lead Channel Setting Pacing Pulse Width: 0.4 ms
MDC IDC LEAD LOCATION: 753859
MDC IDC LEAD LOCATION: 753860
MDC IDC LEAD MODEL: 6940
MDC IDC LEAD SERIAL: 110961
MDC IDC MSMT BATTERY REMAINING LONGEVITY: 80 mo
MDC IDC MSMT BATTERY VOLTAGE: 2.98 V
MDC IDC MSMT LEADCHNL LV IMPEDANCE VALUE: 1159 Ohm
MDC IDC MSMT LEADCHNL LV PACING THRESHOLD AMPLITUDE: 0.625 V
MDC IDC MSMT LEADCHNL RA IMPEDANCE VALUE: 513 Ohm
MDC IDC MSMT LEADCHNL RA PACING THRESHOLD AMPLITUDE: 0.75 V
MDC IDC MSMT LEADCHNL RA SENSING INTR AMPL: 1.75 mV
MDC IDC MSMT LEADCHNL RA SENSING INTR AMPL: 1.75 mV
MDC IDC MSMT LEADCHNL RV PACING THRESHOLD AMPLITUDE: 2.5 V
MDC IDC MSMT LEADCHNL RV PACING THRESHOLD PULSEWIDTH: 0.4 ms
MDC IDC MSMT LEADCHNL RV SENSING INTR AMPL: 2.75 mV
MDC IDC PG IMPLANT DT: 20150921
MDC IDC SET LEADCHNL LV PACING AMPLITUDE: 1.75 V
MDC IDC SET LEADCHNL RV PACING AMPLITUDE: 0.5 V
MDC IDC SET LEADCHNL RV SENSING SENSITIVITY: 0.3 mV
MDC IDC STAT BRADY AP VP PERCENT: 14.34 %
MDC IDC STAT BRADY AS VS PERCENT: 0.15 %
MDC IDC STAT BRADY RV PERCENT PACED: 98.99 %

## 2016-03-21 ENCOUNTER — Other Ambulatory Visit: Payer: Medicare Other | Admitting: *Deleted

## 2016-03-21 DIAGNOSIS — I255 Ischemic cardiomyopathy: Secondary | ICD-10-CM | POA: Diagnosis not present

## 2016-03-21 DIAGNOSIS — I5022 Chronic systolic (congestive) heart failure: Secondary | ICD-10-CM | POA: Diagnosis not present

## 2016-03-21 LAB — BASIC METABOLIC PANEL
BUN: 20 mg/dL (ref 7–25)
CALCIUM: 9.4 mg/dL (ref 8.6–10.3)
CHLORIDE: 100 mmol/L (ref 98–110)
CO2: 30 mmol/L (ref 20–31)
Creat: 1.5 mg/dL — ABNORMAL HIGH (ref 0.70–1.11)
GLUCOSE: 87 mg/dL (ref 65–99)
Potassium: 3.9 mmol/L (ref 3.5–5.3)
SODIUM: 137 mmol/L (ref 135–146)

## 2016-03-22 ENCOUNTER — Encounter: Payer: Self-pay | Admitting: Cardiology

## 2016-03-29 ENCOUNTER — Telehealth: Payer: Self-pay | Admitting: Internal Medicine

## 2016-03-29 NOTE — Telephone Encounter (Signed)
The patient had dropped off an application for a handicap placard to be completed. This has been signed by Dr. Caryl Comes. I called the patient to notify him I will be mailing this to him. Mailing address confirmed.

## 2016-05-27 ENCOUNTER — Other Ambulatory Visit: Payer: Self-pay | Admitting: Internal Medicine

## 2016-05-27 MED ORDER — FUROSEMIDE 40 MG PO TABS: 40.0000 mg | ORAL_TABLET | Freq: Every day | ORAL | 5 refills | Status: DC

## 2016-06-04 ENCOUNTER — Encounter: Payer: Medicare Other | Admitting: Physician Assistant

## 2016-06-30 NOTE — Progress Notes (Signed)
Cardiology Office Note Date:  07/01/2016  Patient ID:  [name] [name], [name] [name] 1930/07/18, MRN [id number] PCP:  [name], MD  Cardiologist:  Dr. Julianne Handler Electrophysiologist: Dr. Caryl Comes   Chief Complaint: routine check in visit  History of Present Illness: ADI DORO is a 83 y.o. male with history of ICM, CHF (systolic) CRT-D in place, CAD (CABG), HTN, hypothyroidism, HLD.  He comes in today to be seen for Dr. Caryl Comes, last visit with him was in September and doing pretty well, noting a significant decrease in his PVC burden with the amiodarone, his ICD was re[programmed to improve EKG to a more upright V1 morphology.  He saw Dr. Julianne Handler April 2016 doing well at that time  PMD January recommended investigation of skin lesions concerning for possible pre-cancerous but patient declined. He reports no labs being done.  He feels very well today, no CP, palpitations or SOB, no dizziness, near syncope or syncope, no shocks from his device.  He reports that he sleeps well, denies symptoms of PND or orthopnea, says he does all the activities around the house he wants/needs to without difficulty and walks on the treadmill or stationary bike every day some days are better the others, in general though remarks he doesn't feel like he has much energy, requests we re-do his handicap parking form.  He self stopped the amiodarone a few months ago, reports that his tongue turned black/dark and resolved off the amiodarone.  DEVICE information: MDT CRT-D, original device PPM 2001,  upgraded to CRT-D 2009, gen change 2015 Known stable low R wave amplitude AAD Tx 2/2 PVC's w/amiodarone (intolerant of 400mg  dosing) >> self stopped, patient reported it turned his tongue black resolved off it + hx of ATP for VT 01/10/16: LFTs wnl 12/15/15: TSH 2.47   Past Medical History:  Diagnosis Date  . CAD (coronary artery disease)    status post coronary bypass graft surgery in 1989 with documented occulsion  of the vein graft to the diagonal branch of the left anterior descending in 2001.  Marland Kitchen COPD (chronic obstructive pulmonary disease) ([place])   . Hyperlipidemia   . Hypertension   . Ischemic cardiomyopathy    ejection fraction of 30% improved 35-40% 5/09  . Systolic congestive heart failure (HCC)    Class III systolic congestive heart, improved to class II, euvolemic.    Past Surgical History:  Procedure Laterality Date  . BI-VENTRICULAR IMPLANTABLE CARDIOVERTER DEFIBRILLATOR  (CRT-D)  2005; 2015   STJ CRTD implanted by Dr Caryl Comes  . BI-VENTRICULAR IMPLANTABLE CARDIOVERTER DEFIBRILLATOR UPGRADE N/A 12/13/2013   Procedure: BI-VENTRICULAR IMPLANTABLE CARDIOVERTER DEFIBRILLATOR UPGRADE;  Surgeon: [name], MD;  Location: [facility] C[facility] LAB;  Service: Cardiovascular;  Laterality: N/A;  . CORONARY ARTERY BYPASS GRAFT  1989    Current Outpatient Prescriptions  Medication Sig Dispense Refill  . acetaminophen (TYLENOL) 500 MG tablet Take 500 mg by mouth every 6 (six) hours as needed for headache.     Marland Kitchen aspirin 81 MG tablet Take 81 mg by mouth daily.     . furosemide (LASIX) 40 MG tablet Take 1 tablet (40 mg total) by mouth daily. 30 tablet 5  . losartan (COZAAR) 50 MG tablet TAKE ONE TABLET BY MOUTH ONCE DAILY 30 tablet 10  . Multiple Vitamins-Minerals (MULTIVITAMIN WITH MINERALS) tablet Take 1 tablet by mouth daily.     . nitroGLYCERIN (NITROSTAT) 0.4 MG SL tablet Place 1 tablet (0.4 mg total) under the tongue every 5 (five) minutes as  needed for chest pain. 25 tablet 6  . Omega-3 Fatty Acids (FISH OIL) 1000 MG CPDR Take by mouth.    . spironolactone (ALDACTONE) 25 MG tablet TAKE ONE-HALF TABLET BY MOUTH ONCE DAILY 45 tablet 3   No current facility-administered medications for this visit.     Allergies:   Pollen extract; Ramipril; and Crestor [rosuvastatin]   Social History:  The patient  reports that he quit smoking about 28 years ago. His smoking use included Cigarettes. He has a 43.00 pack-year  smoking history. He has never used smokeless tobacco. He reports that he does not drink alcohol or use drugs.   Family History:  The patient's family history includes Heart failure in his mother.  ROS:  Please see the history of present illness.   All other systems are reviewed and otherwise negative.   PHYSICAL EXAM:  VS:  BP 124/66   Pulse 69   Ht 5\' 7"  (1.702 m)   Wt 149 lb (67.6 kg)   BMI 23.34 kg/m  BMI: Body mass index is 23.34 kg/m. Thin, though well nourished, well developed, in no acute distress  HEENT: normocephalic, atraumatic  Neck: no JVD, carotid bruits or masses Cardiac: RRR; no significant murmurs, no rubs, or gallops Lungs:  CTA b/l, no wheezing, rhonchi or rales  Abd: soft, nontender MS: no deformity , age appropriate atrophy Ext: no edema  Skin: warm and dry, no rash, erythematous plaque area inferior to his device, this the patient reports unchanged for years,  Neuro:  No gross deficits appreciated Psych: euthymic mood, full affect   ICD site is stable, no tethering or discomfort   EKG:  Done 12/15/15 SR/V paced ICD check done today via industry and reviewed by myself : battery and lead testing are stable, NSVT and 2 monitored VT episodes  (both occurred in Dec 2017), LV only pacing (programmed this way last visit with Dr. Caryl Comes)  11/28/15: TTE Study Conclusions - Left ventricle: The cavity size was normal. Wall thickness was   normal. Systolic function was mildly reduced. The estimated   ejection fraction was in the range of 45% to 50%. Doppler   parameters are consistent with abnormal left ventricular   relaxation (grade 1 diastolic dysfunction). - Aortic valve: There was mild stenosis. - Atrial septum: The septum was thickened.   05/09/09: Echocardiogram Study Conclusions - Left ventricle: There is akinesis of the inferior and posterior  walls. There is aneurysmal dilitation of the base of the inferior  wall. The EF is in the 30% range. -  Aortic valve: Sclerosis without stenosis. - Mitral valve: Mild regurgitation. - Right ventricle: Pacer wire or catheter noted in right ventricle.  catheterization in July of 2011. He had total occlusion of the vein graft to the right coronary artery and total occlusion of the vein graft to the diagonal branch Stress myoview without ischemia Sept 2015  Recent Labs: 08/22/2015: Magnesium 2.4 12/15/2015: TSH 2.47 01/10/2016: ALT 11 03/21/2016: BUN 20; Creat 1.50; Potassium 3.9; Sodium 137  08/22/2015: Cholesterol 267; HDL 43; LDL Cholesterol 195; Total CHOL/HDL Ratio 6.2; Triglycerides 145; VLDL 29   CrCl cannot be calculated (Patient's most recent lab result is older than the maximum 21 days allowed.).   Wt Readings from Last 3 Encounters:  07/01/16 149 lb (67.6 kg)  12/15/15 144 lb 12.8 oz (65.7 kg)  10/31/15 148 lb 6.4 oz (67.3 kg)     Other studies reviewed: Additional studies/records reviewed today include: summarized above    ASSESSMENT AND  PLAN:   1. CRT-D     Hx of ATP for VT historically, no shocks     Self stopped his amiodarone     Interrogation findings above, and reprogrammed as noted below  VT in December, rate about 160, longest 1 minute duration, no reported symptoms He does not want to reconsider amiodarone, I have not heard of this reaction before, possibly something else at the time, but he is certain  2.  ICM, chronic CHF      Weight is up, but exam appears well compensated with no evidence of fluid OL by optivol      He is on lasix and ARB, his list no longer includes BB, unclear when/why stopped  3. HTN     Stable, follow  4. HLD     Not on a statin, he reports severe myalgias/cramping with at least 2 medicines historically (appears Crestor and zocor)      He reports labs done with PMD     remains resistent to trying another, will not consider an anything he would have to inject  5. CAD     On ASA, has PRN s/l NTG though reports never having to  use     No anginal c/o   Will start Metoprolol Succ. 25mg  daily for PVC's, VT  Disposition: Remote ICD check in 3 months, Dr .Caryl Comes in 35months, sooner if needed.  BMET today given his meds.   Current medicines are reviewed at length with the patient today.  The patient did not have any concerns regarding medicines.  Haywood Lasso, PA-C 07/01/2016 1:25 PM     [address] [place] [id number] [phone] (office)  [phone] (fax)

## 2016-07-01 ENCOUNTER — Ambulatory Visit (INDEPENDENT_AMBULATORY_CARE_PROVIDER_SITE_OTHER): Payer: Medicare Other | Admitting: Physician Assistant

## 2016-07-01 VITALS — BP 124/66 | HR 69 | Ht 67.0 in | Wt 149.0 lb

## 2016-07-01 DIAGNOSIS — E7849 Other hyperlipidemia: Secondary | ICD-10-CM

## 2016-07-01 DIAGNOSIS — I472 Ventricular tachycardia, unspecified: Secondary | ICD-10-CM

## 2016-07-01 DIAGNOSIS — Z95810 Presence of automatic (implantable) cardiac defibrillator: Secondary | ICD-10-CM | POA: Diagnosis not present

## 2016-07-01 DIAGNOSIS — E784 Other hyperlipidemia: Secondary | ICD-10-CM | POA: Diagnosis not present

## 2016-07-01 DIAGNOSIS — I5022 Chronic systolic (congestive) heart failure: Secondary | ICD-10-CM | POA: Diagnosis not present

## 2016-07-01 DIAGNOSIS — I2510 Atherosclerotic heart disease of native coronary artery without angina pectoris: Secondary | ICD-10-CM | POA: Diagnosis not present

## 2016-07-01 DIAGNOSIS — I255 Ischemic cardiomyopathy: Secondary | ICD-10-CM

## 2016-07-01 DIAGNOSIS — Z79899 Other long term (current) drug therapy: Secondary | ICD-10-CM

## 2016-07-01 MED ORDER — METOPROLOL SUCCINATE ER 25 MG PO TB24: 25.0000 mg | ORAL_TABLET | Freq: Every day | ORAL | 6 refills | Status: DC

## 2016-07-01 NOTE — Patient Instructions (Signed)
Medication Instructions:   START TAKING METOPROLOL SUCCINATE 25 MG ONCE A DAY   If you need a refill on your cardiac medications before your next appointment, please call your pharmacy.  Labwork: BMET TODAY    Testing/Procedures: NONE ORDERED  TODAY    Follow-Up: Your physician wants you to follow-up in:  [place] will receive a reminder letter in the mail two months in advance. If you don't receive a letter, please call our office to schedule the follow-up appointment.   Remote monitoring is used to monitor your Pacemaker of ICD from home. This monitoring reduces the number of office visits required to check your device to one time per year. It allows [place] to keep an eye on the functioning of your device to ensure it is working properly. You are scheduled for a device check from home on.. 1*4*6047 You may send your transmission at any time that day. If you have a wireless device, the transmission will be sent automatically. After your physician reviews your transmission, you will receive a postcard with your next transmission date.     Any Other Special Instructions Will Be Listed Below (If Applicable).

## 2016-07-02 LAB — BASIC METABOLIC PANEL
BUN / CREAT RATIO: 13 (ref 10–24)
BUN: 21 mg/dL (ref 8–27)
CHLORIDE: 98 mmol/L (ref 96–106)
CO2: 26 mmol/L (ref 18–29)
Calcium: 9.9 mg/dL (ref 8.6–10.2)
Creatinine, Ser: 1.63 mg/dL — ABNORMAL HIGH (ref 0.76–1.27)
GFR calc non Af Amer: 38 mL/min/{1.73_m2} — ABNORMAL LOW (ref >59)
GFR, EST AFRICAN AMERICAN: 44 mL/min/{1.73_m2} — AB (ref >59)
GLUCOSE: 93 mg/dL (ref 65–99)
Potassium: 4.5 mmol/L (ref 3.5–5.2)
SODIUM: 138 mmol/L (ref 134–144)

## 2016-09-06 ENCOUNTER — Ambulatory Visit (INDEPENDENT_AMBULATORY_CARE_PROVIDER_SITE_OTHER): Payer: Medicare Other | Admitting: *Deleted

## 2016-09-06 ENCOUNTER — Telehealth: Payer: Self-pay | Admitting: Cardiology

## 2016-09-06 DIAGNOSIS — I472 Ventricular tachycardia, unspecified: Secondary | ICD-10-CM

## 2016-09-06 LAB — CUP PACEART INCLINIC DEVICE CHECK
Battery Remaining Longevity: 69 mo
Brady Statistic AP VP Percent: 41.54 %
Brady Statistic AS VP Percent: 57.85 %
Brady Statistic AS VS Percent: 0.59 %
Brady Statistic RV Percent Paced: 93.77 %
Date Time Interrogation Session: 20180615134115
HIGH POWER IMPEDANCE MEASURED VALUE: 46 Ohm
HighPow Impedance: 67 Ohm
Implantable Lead Implant Date: 20010823
Implantable Lead Implant Date: 20091119
Implantable Lead Location: 753859
Implantable Lead Location: 753860
Implantable Lead Model: 148
Implantable Lead Serial Number: 110961
Lead Channel Impedance Value: 247 Ohm
Lead Channel Impedance Value: 380 Ohm
Lead Channel Impedance Value: 589 Ohm
Lead Channel Impedance Value: 988 Ohm
Lead Channel Pacing Threshold Amplitude: 0.625 V
Lead Channel Pacing Threshold Amplitude: 0.75 V
Lead Channel Pacing Threshold Amplitude: 2.5 V
Lead Channel Pacing Threshold Pulse Width: 0.4 ms
Lead Channel Pacing Threshold Pulse Width: 0.4 ms
Lead Channel Sensing Intrinsic Amplitude: 1.5 mV
Lead Channel Setting Pacing Amplitude: 0.5 V
Lead Channel Setting Pacing Amplitude: 2.5 V
Lead Channel Setting Pacing Pulse Width: 0.03 ms
Lead Channel Setting Sensing Sensitivity: 0.3 mV
MDC IDC LEAD IMPLANT DT: 20010823
MDC IDC LEAD LOCATION: 753858
MDC IDC MSMT BATTERY VOLTAGE: 2.97 V
MDC IDC MSMT LEADCHNL LV IMPEDANCE VALUE: 570 Ohm
MDC IDC MSMT LEADCHNL LV PACING THRESHOLD PULSEWIDTH: 0.4 ms
MDC IDC MSMT LEADCHNL RA IMPEDANCE VALUE: 570 Ohm
MDC IDC MSMT LEADCHNL RA SENSING INTR AMPL: 1.5 mV
MDC IDC MSMT LEADCHNL RV SENSING INTR AMPL: 2.75 mV
MDC IDC MSMT LEADCHNL RV SENSING INTR AMPL: 3 mV
MDC IDC PG IMPLANT DT: 20150921
MDC IDC SET LEADCHNL LV PACING AMPLITUDE: 1.75 V
MDC IDC SET LEADCHNL LV PACING PULSEWIDTH: 0.4 ms
MDC IDC STAT BRADY AP VS PERCENT: 0.02 %
MDC IDC STAT BRADY RA PERCENT PACED: 38.98 %

## 2016-09-06 NOTE — Telephone Encounter (Signed)
Pt wife called and stated that pt heard an alert tone from his device this morning. Instructed pt wife to send a remote transmission w/ pt home monitor. Once transmission is received a Chief Operating Officer will review and call him back. Pt wife verbalized understanding.

## 2016-09-06 NOTE — Telephone Encounter (Signed)
Spoke with Mrs. Amis and offered a 1pm appointment today with [facility]. She will call back if Mr. Dullea cannot make that time.  LV pacing impedance out of range alert- LV pacing impedance trend stable. Alert range reprogramming needed.

## 2016-09-06 NOTE — Progress Notes (Signed)
Patient add on today in clinic for alert tone for LV lead impedance. Interrogation shows LV lead impedance trends stable with exception of june 10 at which it alerted at >1500ohms. Sensing and threshold stable in LV lead with overall impedance trends stable. Impedance alert increased to 200ohms. Recall for ROV with SK 12/2016

## 2016-09-07 ENCOUNTER — Other Ambulatory Visit: Payer: Self-pay | Admitting: Cardiovascular Disease

## 2016-09-28 ENCOUNTER — Encounter: Payer: Self-pay | Admitting: Family

## 2016-09-28 ENCOUNTER — Ambulatory Visit (INDEPENDENT_AMBULATORY_CARE_PROVIDER_SITE_OTHER): Payer: Medicare Other | Admitting: Family

## 2016-09-28 VITALS — BP 117/62 | HR 91 | Temp 99.0°F | Ht 67.0 in | Wt 143.0 lb

## 2016-09-28 DIAGNOSIS — R319 Hematuria, unspecified: Secondary | ICD-10-CM | POA: Diagnosis not present

## 2016-09-28 DIAGNOSIS — R5383 Other fatigue: Secondary | ICD-10-CM

## 2016-09-28 DIAGNOSIS — S40862A Insect bite (nonvenomous) of left upper arm, initial encounter: Secondary | ICD-10-CM

## 2016-09-28 DIAGNOSIS — W57XXXA Bitten or stung by nonvenomous insect and other nonvenomous arthropods, initial encounter: Secondary | ICD-10-CM | POA: Diagnosis not present

## 2016-09-28 MED ORDER — DOXYCYCLINE HYCLATE 100 MG PO TABS: 100.0000 mg | ORAL_TABLET | Freq: Two times a day (BID) | ORAL | 0 refills | Status: DC

## 2016-09-28 NOTE — Addendum Note (Signed)
Addended by: Evelina Dun A on: 09/28/2016 10:50 AM   Modules accepted: Orders

## 2016-09-28 NOTE — Patient Instructions (Signed)

## 2016-09-28 NOTE — Progress Notes (Signed)
   Subjective:    Patient ID: [name] [name], male    DOB: 1930-04-08, 83 y.o.   MRN: [id number]  HPI  Pt presents to the office today with fatigue for the last two weeks that is gradually worsen. Wife states over the last two days pt will fall asleep any time he sits down.  Pt denies any cough, rash, new joint pain,or  Dysuria.   PT reports he has a tick bite on left arm two weeks.   Pt has CAD, cardiomyopathy, and CHF that he is followed by Cardiologists 6 months.    Review of Systems  Constitutional: Positive for fatigue.  Respiratory: Negative for cough, chest tightness, shortness of breath and wheezing.   Cardiovascular: Negative for chest pain.  All other systems reviewed and are negative.      Objective:   Physical Exam  Constitutional: He is oriented to person, place, and time. He appears well-developed and well-nourished. No distress.  HENT:  Head: Normocephalic.  Right Ear: External ear normal.  Left Ear: External ear normal.  Nose: Nose normal.  Mouth/Throat: Oropharynx is clear and moist.  Eyes: Pupils are equal, round, and reactive to light. Right eye exhibits no discharge. Left eye exhibits no discharge.  Neck: Normal range of motion. Neck supple. No thyromegaly present.  Cardiovascular: Normal rate, regular rhythm, normal heart sounds and intact distal pulses.   No murmur heard. Pulmonary/Chest: Effort normal. No respiratory distress. He has decreased breath sounds. He has no wheezes.  Abdominal: Soft. Bowel sounds are normal. He exhibits no distension. There is no tenderness.  Musculoskeletal: Normal range of motion. He exhibits no edema or tenderness.  Neurological: He is alert and oriented to person, place, and time.  Skin: Skin is warm and dry. No rash noted. No erythema.  Small erythemas lesion on left medial upper arm approm. 1X1cm  Psychiatric: He has a normal mood and affect. His behavior is normal. Judgment and thought content normal.  Vitals  reviewed.    BP 117/62   Pulse 91   Temp 99 F (37.2 C) (Oral)   Ht _0  (1.702 m)   Wt 143 lb (64.9 kg)   BMI 22.40 kg/m      Assessment & Plan:  1. Lethargy - Urinalysis - CMP14+EGFR - Urine Culture  2. Hematuria, unspecified type - CMP14+EGFR - Urine Culture - doxycycline (VIBRA-TABS) 100 MG tablet; Take 1 tablet (100 mg total) by mouth 2 (two) times daily.  Dispense: 28 tablet; Refill: 0  3. Tick bite of left upper arm, initial encounter - CMP14+EGFR - Lyme Ab/Western Blot Reflex - Rocky mtn spotted fvr abs pnl(IgG+IgM) - doxycycline (VIBRA-TABS) 100 MG tablet; Take 1 tablet (100 mg total) by mouth 2 (two) times daily.  Dispense: 28 tablet; Refill: 0   Will treat with Doxycycline  Urine culture pending- Even though I do not believe this is related to UTI Force fluids Tylenol prn  Rest RTO prn or if symptoms do not improve or worsen   Evelina Dun, FNP

## 2016-09-29 LAB — URINE CULTURE: ORGANISM ID, BACTERIA: NO GROWTH

## 2016-09-30 ENCOUNTER — Ambulatory Visit (INDEPENDENT_AMBULATORY_CARE_PROVIDER_SITE_OTHER): Payer: Medicare Other | Admitting: *Deleted

## 2016-09-30 DIAGNOSIS — I472 Ventricular tachycardia, unspecified: Secondary | ICD-10-CM

## 2016-09-30 DIAGNOSIS — I5022 Chronic systolic (congestive) heart failure: Secondary | ICD-10-CM

## 2016-09-30 DIAGNOSIS — I255 Ischemic cardiomyopathy: Secondary | ICD-10-CM

## 2016-09-30 LAB — URINALYSIS
Bilirubin, UA: NEGATIVE
GLUCOSE, UA: NEGATIVE
KETONES UA: NEGATIVE
Leukocytes, UA: NEGATIVE
Nitrite, UA: NEGATIVE
Specific Gravity, UA: 1.025 (ref 1.005–1.030)
Urobilinogen, Ur: 1 mg/dL (ref 0.2–1.0)
pH, UA: 5.5 (ref 5.0–7.5)

## 2016-09-30 NOTE — Progress Notes (Signed)
Remote ICD transmission.   

## 2016-10-01 ENCOUNTER — Telehealth: Payer: Self-pay | Admitting: Family Medicine

## 2016-10-01 LAB — CMP14+EGFR
A/G RATIO: 1.4 (ref 1.2–2.2)
ALK PHOS: 54 IU/L (ref 39–117)
ALT: 8 IU/L (ref 0–44)
AST: 15 IU/L (ref 0–40)
Albumin: 4 g/dL (ref 3.5–4.7)
BUN/Creatinine Ratio: 14 (ref 10–24)
BUN: 26 mg/dL (ref 8–27)
Bilirubin Total: 1 mg/dL (ref 0.0–1.2)
CO2: 20 mmol/L (ref 20–29)
Calcium: 9.5 mg/dL (ref 8.6–10.2)
Chloride: 95 mmol/L — ABNORMAL LOW (ref 96–106)
Creatinine, Ser: 1.85 mg/dL — ABNORMAL HIGH (ref 0.76–1.27)
GFR calc Af Amer: 38 mL/min/{1.73_m2} — ABNORMAL LOW (ref >59)
GFR calc non Af Amer: 33 mL/min/{1.73_m2} — ABNORMAL LOW (ref >59)
GLOBULIN, TOTAL: 2.9 g/dL (ref 1.5–4.5)
Glucose: 131 mg/dL — ABNORMAL HIGH (ref 65–99)
POTASSIUM: 4.7 mmol/L (ref 3.5–5.2)
SODIUM: 134 mmol/L (ref 134–144)
Total Protein: 6.9 g/dL (ref 6.0–8.5)

## 2016-10-01 LAB — CUP PACEART REMOTE DEVICE CHECK
Battery Remaining Longevity: 67 mo
Brady Statistic AS VS Percent: 1.9 %
Brady Statistic RV Percent Paced: 89.45 %
HIGH POWER IMPEDANCE MEASURED VALUE: 49 Ohm
HIGH POWER IMPEDANCE MEASURED VALUE: 68 Ohm
Implantable Lead Implant Date: 20010823
Implantable Lead Implant Date: 20091119
Implantable Lead Location: 753858
Implantable Lead Location: 753859
Implantable Lead Location: 753860
Implantable Lead Model: 148
Implantable Lead Model: 6940
Implantable Pulse Generator Implant Date: 20150921
Lead Channel Impedance Value: 266 Ohm
Lead Channel Pacing Threshold Amplitude: 0.625 V
Lead Channel Pacing Threshold Amplitude: 0.75 V
Lead Channel Pacing Threshold Amplitude: 2.5 V
Lead Channel Sensing Intrinsic Amplitude: 4.5 mV
Lead Channel Setting Pacing Amplitude: 0.5 V
Lead Channel Setting Pacing Amplitude: 1.75 V
Lead Channel Setting Pacing Amplitude: 2.5 V
Lead Channel Setting Pacing Pulse Width: 0.03 ms
MDC IDC LEAD IMPLANT DT: 20010823
MDC IDC LEAD SERIAL: 110961
MDC IDC MSMT BATTERY VOLTAGE: 2.99 V
MDC IDC MSMT LEADCHNL LV IMPEDANCE VALUE: 1045 Ohm
MDC IDC MSMT LEADCHNL LV IMPEDANCE VALUE: 570 Ohm
MDC IDC MSMT LEADCHNL LV IMPEDANCE VALUE: 646 Ohm
MDC IDC MSMT LEADCHNL LV PACING THRESHOLD PULSEWIDTH: 0.4 ms
MDC IDC MSMT LEADCHNL RA IMPEDANCE VALUE: 532 Ohm
MDC IDC MSMT LEADCHNL RA PACING THRESHOLD PULSEWIDTH: 0.4 ms
MDC IDC MSMT LEADCHNL RA SENSING INTR AMPL: 1.375 mV
MDC IDC MSMT LEADCHNL RA SENSING INTR AMPL: 1.375 mV
MDC IDC MSMT LEADCHNL RV IMPEDANCE VALUE: 380 Ohm
MDC IDC MSMT LEADCHNL RV PACING THRESHOLD PULSEWIDTH: 0.4 ms
MDC IDC MSMT LEADCHNL RV SENSING INTR AMPL: 4.5 mV
MDC IDC SESS DTM: 20180709052408
MDC IDC SET LEADCHNL LV PACING PULSEWIDTH: 0.4 ms
MDC IDC SET LEADCHNL RV SENSING SENSITIVITY: 0.3 mV
MDC IDC STAT BRADY AP VP PERCENT: 15.96 %
MDC IDC STAT BRADY AP VS PERCENT: 0.02 %
MDC IDC STAT BRADY AS VP PERCENT: 82.12 %
MDC IDC STAT BRADY RA PERCENT PACED: 15.06 %

## 2016-10-01 LAB — ROCKY MTN SPOTTED FVR ABS PNL(IGG+IGM)
RMSF IgG: NEGATIVE
RMSF IgM: 0.18 index (ref 0.00–0.89)

## 2016-10-01 LAB — LYME AB/WESTERN BLOT REFLEX

## 2016-10-02 ENCOUNTER — Ambulatory Visit (INDEPENDENT_AMBULATORY_CARE_PROVIDER_SITE_OTHER): Payer: Medicare Other | Admitting: Family Medicine

## 2016-10-02 ENCOUNTER — Encounter: Payer: Self-pay | Admitting: Family Medicine

## 2016-10-02 ENCOUNTER — Ambulatory Visit (INDEPENDENT_AMBULATORY_CARE_PROVIDER_SITE_OTHER): Payer: Medicare Other

## 2016-10-02 VITALS — BP 109/63 | HR 78 | Temp 98.4°F | Ht 67.0 in | Wt 139.0 lb

## 2016-10-02 DIAGNOSIS — R5383 Other fatigue: Secondary | ICD-10-CM | POA: Diagnosis not present

## 2016-10-02 DIAGNOSIS — R0602 Shortness of breath: Secondary | ICD-10-CM

## 2016-10-02 MED ORDER — PREDNISONE 20 MG PO TABS: ORAL_TABLET | ORAL | 0 refills | Status: DC

## 2016-10-02 NOTE — Progress Notes (Signed)
BP 109/63   Pulse 78   Temp 98.4 F (36.9 C) (Oral)   Ht 5\' 7"  (1.702 m)   Wt 139 lb (63 kg)   BMI 21.77 kg/m    Subjective:    Patient ID: [name] [name], male    DOB: [birth year], 83 y.o.   MRN: [id number]  HPI: [name] [name] is a 83 y.o. male presenting on 10/02/2016 for Fatigue (pt here today due to fatigue, he is here with his wife and  they both say he isn't as tired and fatigue now as he was over the weekend)   HPI Fatigue and shortness of breath Patient has been having increased fatigue and shortness of breath that has been worsening over the past few weeks but was especially fatigued over the weekend. he says that he is only slightly short of breath more than he usually is but he just feels like he does not have any energy to get up and do the things that he normally does. He denies any coughing spells but does have a little bit of wheezing. He denies any fevers or chills. He denies any chest pain. He does have a history of CHF. He denies any urinary problems or abdominal problems or constipation or nausea or vomiting. He denies any abnormal rashes  Relevant past medical, surgical, family and social history reviewed and updated as indicated. Interim medical history since our last visit reviewed. Allergies and medications reviewed and updated.  Review of Systems  Constitutional: Positive for fatigue. Negative for chills and fever.  HENT: Positive for congestion.   Eyes: Negative for discharge.  Respiratory: Positive for shortness of breath and wheezing. Negative for cough and chest tightness.   Cardiovascular: Negative for chest pain, palpitations and leg swelling.  Gastrointestinal: Negative for abdominal pain.  Musculoskeletal: Negative for back pain and gait problem.  Skin: Negative for rash.  All other systems reviewed and are negative.   Per HPI unless specifically indicated above        Objective:    BP 109/63   Pulse 78   Temp 98.4 F (36.9 C) (Oral)   Ht  5\' 7"  (1.702 m)   Wt 139 lb (63 kg)   BMI 21.77 kg/m   Wt Readings from Last 3 Encounters:  10/02/16 139 lb (63 kg)  09/28/16 143 lb (64.9 kg)  07/01/16 149 lb (67.6 kg)    Physical Exam  Constitutional: He is oriented to person, place, and time. He appears well-developed and well-nourished. No distress.  Eyes: Conjunctivae are normal. No scleral icterus.  Cardiovascular: Normal rate, regular rhythm, normal heart sounds and intact distal pulses.   No murmur heard. Pulmonary/Chest: Effort normal. No respiratory distress. He has wheezes (Wheezing in upper lobes). He has no rales. He exhibits no tenderness.  Musculoskeletal: Normal range of motion. He exhibits no edema.  Neurological: He is alert and oriented to person, place, and time. Coordination normal.  Skin: Skin is warm and dry. No rash noted. He is not diaphoretic.  Psychiatric: He has a normal mood and affect. His behavior is normal.  Nursing note and vitals reviewed.  Chest x-ray: Chest x-ray was read as interstitial disease and possible early pneumonia, patient is already on doxycycline. Give prednisone as well.    Assessment & Plan:   Problem List Items Addressed This Visit    None    Visit Diagnoses    Shortness of breath    -  Primary   Relevant Medications  predniSONE (DELTASONE) 20 MG tablet   Other Relevant Orders   DG Chest 2 View (Completed)   CBC with Differential/Platelet (Completed)   TSH (Completed)   Brain natriuretic peptide (Completed)   Other fatigue       Relevant Orders   DG Chest 2 View (Completed)   CBC with Differential/Platelet (Completed)   TSH (Completed)   Brain natriuretic peptide (Completed)       Follow up plan: Return if symptoms worsen or fail to improve.  Counseling provided for all of the vaccine components Orders Placed This Encounter  Procedures  . DG Chest Calvert Nealy Hickmon, MD [facility] 10/02/2016, 10:00 AM

## 2016-10-03 LAB — CBC WITH DIFFERENTIAL/PLATELET
Basophils Absolute: 0 10*3/uL (ref 0.0–0.2)
Basos: 0 %
EOS (ABSOLUTE): 0.3 10*3/uL (ref 0.0–0.4)
EOS: 2 %
HEMOGLOBIN: 13.7 g/dL (ref 13.0–17.7)
Hematocrit: 39.7 % (ref 37.5–51.0)
IMMATURE GRANS (ABS): 0 10*3/uL (ref 0.0–0.1)
IMMATURE GRANULOCYTES: 0 %
Lymphocytes Absolute: 2 10*3/uL (ref 0.7–3.1)
Lymphs: 15 %
MCH: 30.2 pg (ref 26.6–33.0)
MCHC: 34.5 g/dL (ref 31.5–35.7)
MCV: 87 fL (ref 79–97)
Monocytes Absolute: 0.9 10*3/uL (ref 0.1–0.9)
Monocytes: 7 %
NEUTROS ABS: 10.4 10*3/uL — AB (ref 1.4–7.0)
Neutrophils: 76 %
Platelets: 260 10*3/uL (ref 150–379)
RBC: 4.54 x10E6/uL (ref 4.14–5.80)
RDW: 14.2 % (ref 12.3–15.4)
WBC: 13.7 10*3/uL — AB (ref 3.4–10.8)

## 2016-10-03 LAB — TSH: TSH: 2.62 u[IU]/mL (ref 0.450–4.500)

## 2016-10-03 LAB — BRAIN NATRIURETIC PEPTIDE: BNP: 390.2 pg/mL — ABNORMAL HIGH (ref 0.0–100.0)

## 2016-10-04 ENCOUNTER — Encounter: Payer: Self-pay | Admitting: Cardiology

## 2016-10-07 ENCOUNTER — Telehealth: Payer: Self-pay

## 2016-10-07 NOTE — Telephone Encounter (Signed)
Patient referred to [facility] clinic by Dr Caryl Comes. ICM intro call made to patient.  He stated he had pneumonia and is taking the last of antiobiotics and steroids.  He is feeling much better. Explained monthly follow up and he agreed.  Next ICM remote transmission scheduled for 10/22/2016.  Provided ICM number and encouraged to call for any fluid symptoms.  He reported his monitor is beside his bed.

## 2016-10-11 ENCOUNTER — Telehealth: Payer: Self-pay

## 2016-10-11 NOTE — Telephone Encounter (Signed)
Received call from wife.  She wanted to follow up on ICM clinic call to her husband.  She said he has difficulty hearing so he was not sure he had all the information for monthly ICM follow up. Explained ICM program and she verbalized understanding.  Advise 1st ICM remote transmission scheduled for 7/31 and it should come automatically.  Encouraged to call if patient has any fluid symptoms.

## 2016-10-22 ENCOUNTER — Ambulatory Visit (INDEPENDENT_AMBULATORY_CARE_PROVIDER_SITE_OTHER): Payer: Self-pay

## 2016-10-22 DIAGNOSIS — Z95810 Presence of automatic (implantable) cardiac defibrillator: Secondary | ICD-10-CM

## 2016-10-22 DIAGNOSIS — I5022 Chronic systolic (congestive) heart failure: Secondary | ICD-10-CM

## 2016-10-22 NOTE — Progress Notes (Signed)
EPIC Encounter for ICM Monitoring  Patient Name: PRESTEN JOOST is a 83 y.o. male Date: 10/22/2016 Primary Care Physican: Dettinger, Fransisca Kaufmann, MD Primary Cardiologist: Angelena Form Electrophysiologist: Faustino Congress Weight: 145 lbs  Bi-V Pacing:   85.7%  Since 30-Sep-2016 VT-NS (>4 beats, >162 bpm) 12       Heart Failure questions reviewed, pt asymptomatic.  Reported feeling good and denied any problems at this time.    Thoracic impedance normal.  Prescribed dosage: Furosemide 40 mg 1 tablet daily  Recommendations: No changes.   Encouraged to call for fluid symptoms.  Follow-up plan: ICM clinic phone appointment on 11/22/2016.  9Office appointment scheduled 12/19/2016 with Dr. Caryl Comes.  Copy of ICM check sent to device physician.   3 month ICM trend: 10/22/2016   1 Year ICM trend:      [name], RN 10/22/2016 2:15 PM

## 2016-10-28 ENCOUNTER — Other Ambulatory Visit: Payer: Self-pay | Admitting: Internal Medicine

## 2016-11-22 ENCOUNTER — Ambulatory Visit (INDEPENDENT_AMBULATORY_CARE_PROVIDER_SITE_OTHER): Payer: Medicare Other

## 2016-11-22 DIAGNOSIS — I5022 Chronic systolic (congestive) heart failure: Secondary | ICD-10-CM

## 2016-11-22 DIAGNOSIS — Z95810 Presence of automatic (implantable) cardiac defibrillator: Secondary | ICD-10-CM

## 2016-11-22 NOTE — Progress Notes (Signed)
EPIC Encounter for ICM Monitoring  Patient Name: [name] [name] is a 83 y.o. male Date: 11/22/2016 Primary Care Physican: Dettinger, Fransisca Kaufmann, MD Primary Cardiologist: Caryl Comes Electrophysiologist: Caryl Comes Dry Weight:   146 lbs  Bi-V Pacing:   84%   Clinical Status Since 22-Oct-2016 Monitored VT (133-162 bpm) 0 VT-NS (>4 beats, >162 bpm)  -- 12     Heart Failure questions reviewed, pt asymptomatic.   Thoracic impedance trending just below baseline normal.  Prescribed dosage: Furosemide 40 mg 1 tablet daily  Labs: 09/28/2016 Creatinine 1.85, BUN 26, Potassium 4.7, Sodium 134, EGFR 33-38 07/01/2016 Creatinine 1.63, BUN 21, Potassium 4.5, Sodium 138, EGFR 38-44  Recommendations: No changes.  Encouraged to call for fluid symptoms.  Follow-up plan: ICM clinic phone appointment on 01/20/2017.  Office appointment scheduled 12/19/2016 with Dr. Caryl Comes (primary cardiologist).    Copy of ICM check sent to Dr. Caryl Comes.   3 month ICM trend: 11/22/2016   1 Year ICM trend:      [name], RN 11/22/2016 10:38 AM

## 2016-12-06 ENCOUNTER — Encounter: Payer: Self-pay | Admitting: Internal Medicine

## 2016-12-09 ENCOUNTER — Encounter: Payer: Medicare Other | Admitting: Internal Medicine

## 2016-12-19 ENCOUNTER — Ambulatory Visit (INDEPENDENT_AMBULATORY_CARE_PROVIDER_SITE_OTHER): Payer: Medicare Other | Admitting: Internal Medicine

## 2016-12-19 ENCOUNTER — Encounter: Payer: Medicare Other | Admitting: Internal Medicine

## 2016-12-19 ENCOUNTER — Encounter: Payer: Self-pay | Admitting: Internal Medicine

## 2016-12-19 VITALS — BP 120/60 | HR 77 | Ht 67.0 in | Wt 141.0 lb

## 2016-12-19 DIAGNOSIS — I5022 Chronic systolic (congestive) heart failure: Secondary | ICD-10-CM | POA: Diagnosis not present

## 2016-12-19 DIAGNOSIS — I493 Ventricular premature depolarization: Secondary | ICD-10-CM

## 2016-12-19 DIAGNOSIS — I255 Ischemic cardiomyopathy: Secondary | ICD-10-CM | POA: Diagnosis not present

## 2016-12-19 DIAGNOSIS — Z95810 Presence of automatic (implantable) cardiac defibrillator: Secondary | ICD-10-CM | POA: Diagnosis not present

## 2016-12-19 NOTE — Progress Notes (Signed)
Patient Care Team: Dettinger, Fransisca Kaufmann, MD as PCP - General (Family Medicine)   HPI  [name] [name] is a 83 y.o. male seen in followup for Ischemic cardiomyopathy congestive heart failure for which he is s/p CRT-D implantation. He is s/p CABG and most recent EF was about 30-35% in fall of 2009.   He has stable low amplitude R waves  He says his breathing is much better since his AV optimization echo; he is hoping that there is greater benefit to be gained.   His device reached ERI 9/15 he underwent generator replacement at that time choosing to have CRT-D implanted     He has struggled with fatigue. He has significant breathlessness. It is his and his wife's impression that is worsening. He's had this enormous PVC burden (15-20%) and has been in the past reluctant to undertake therapy for this.  More recently we started him on amiodarone. He was unable to tolerate the 400 mg dose but has tolerated 200 mg dose. This is been associated with significant improvement in exercise tolerance.  Patient denies symptoms of GI intolerance, sun sensitivity, neurological symptoms attributable to amiodarone.  He stopped taking amiodarone on his own. According to the visit 4/18    He underwent catheterization in July of 2011. He had total occlusion of the vein graft to the right coronary artery and total occlusion of the vein graft to the diagonal branch. Ejection fraction was 40%.  SR  Date Cr K TSH  3/15    4.9    7/18  1.85 4.7 2.62        Last labs 3/15 showed K 4.9  No mag    Past Surgical History:  Procedure Laterality Date  . BI-VENTRICULAR IMPLANTABLE CARDIOVERTER DEFIBRILLATOR  (CRT-D)  2005; 2015   STJ CRTD implanted by Dr Caryl Comes  . BI-VENTRICULAR IMPLANTABLE CARDIOVERTER DEFIBRILLATOR UPGRADE N/A 12/13/2013   Procedure: BI-VENTRICULAR IMPLANTABLE CARDIOVERTER DEFIBRILLATOR UPGRADE;  Surgeon: [name], MD;  Location: [facility] CATH LAB;  Service: Cardiovascular;  Laterality:  N/A;  . CORONARY ARTERY BYPASS GRAFT  1989    Current Outpatient Prescriptions  Medication Sig Dispense Refill  . acetaminophen (TYLENOL) 500 MG tablet Take 500 mg by mouth every 6 (six) hours as needed for headache.     Marland Kitchen aspirin 81 MG tablet Take 81 mg by mouth daily.     Marland Kitchen doxycycline (VIBRA-TABS) 100 MG tablet Take 1 tablet (100 mg total) by mouth 2 (two) times daily. 28 tablet 0  . furosemide (LASIX) 40 MG tablet Take 1 tablet (40 mg total) by mouth daily. 30 tablet 5  . losartan (COZAAR) 50 MG tablet TAKE ONE TABLET BY MOUTH ONCE DAILY 30 tablet 10  . metoprolol succinate (TOPROL XL) 25 MG 24 hr tablet Take 1 tablet (25 mg total) by mouth daily. 30 tablet 6  . Multiple Vitamins-Minerals (MULTIVITAMIN WITH MINERALS) tablet Take 1 tablet by mouth daily.     . nitroGLYCERIN (NITROSTAT) 0.4 MG SL tablet Place 1 tablet (0.4 mg total) under the tongue every 5 (five) minutes as needed for chest pain. 25 tablet 6  . Omega-3 Fatty Acids (FISH OIL) 1000 MG CPDR Take by mouth.    . predniSONE (DELTASONE) 20 MG tablet 2 po at same time daily for 5 days 10 tablet 0  . spironolactone (ALDACTONE) 25 MG tablet Take 0.5 tablets (12.5 mg total) by mouth daily. Please keep 12/19/16 appt for further refills 45 tablet 0   No  current facility-administered medications for this visit.     Allergies  Allergen Reactions  . Pollen Extract   . Ramipril Hives and Swelling  . Crestor [Rosuvastatin] Anxiety    Review of Systems negative except from HPI and PMH  Physical Exam BP 120/60   Pulse 77   Ht 5\' 7"  (1.702 m)   Wt 141 lb (64 kg)   SpO2 94%   BMI 22.08 kg/m  Well developed and nourished in no acute distress HENT normal Neck supple with JVP flat Clear Device pocket well healed; without hematoma or erythema.  There is a superficial erythematous plaque at the base distal caudal aspect of his defibrillator. There is no tethering The patient's device was interrogated.  The information was reviewed.  No changes were made in the programming.   Regular rate and rhythm, no murmurs or gallops Abd-soft with active BS No Clubbing cyanosis tr edema Skin-warm and dry A & Oriented  Grossly normal sensory and motor function   ECG demonstrates P. synchronous pacing with no pVCs QRS morphology was negative in lead V1 and positive in lead 1. With multiple appropriate interventions ultimately LV pacing only any AV delay shortened by 20 ms the QRS morphology became upright in lead V1 overall QRS with increased however from 140 ms to 180 ms.   Assessment and  Plan  Ischemic cardiomyopathy  Congestive heart failure-chronic-systolic  CRT-D- [employer] The patient's device was interrogated.  The information was reviewed. Programming changes were made as below   PVCs    He continues to have frequent PVCs. They are not bothering him at this point.  Without symptoms of ischemia  Euvolemic continue current meds

## 2016-12-19 NOTE — Patient Instructions (Signed)
Medication Instructions:  Your physician recommends that you continue on your current medications as directed. Please refer to the Current Medication list given to you today.  -- If you need a refill on your cardiac medications before your next appointment, please call your pharmacy. --  Labwork: None ordered  Testing/Procedures: None ordered  Follow-Up: Your physician recommends that you schedule a follow-up appointment in: 3 months with Chanetta Marshall, NP.  Thank you for choosing [facility]!!

## 2016-12-20 ENCOUNTER — Other Ambulatory Visit: Payer: Self-pay | Admitting: Internal Medicine

## 2016-12-20 LAB — CUP PACEART INCLINIC DEVICE CHECK
Date Time Interrogation Session: 20180928093737
Implantable Lead Implant Date: 20010823
Implantable Lead Implant Date: 20091119
Implantable Lead Location: 753858
Implantable Lead Location: 753860
Implantable Lead Model: 6940
Implantable Pulse Generator Implant Date: 20150921
MDC IDC LEAD IMPLANT DT: 20010823
MDC IDC LEAD LOCATION: 753859
MDC IDC LEAD SERIAL: 110961

## 2017-01-08 ENCOUNTER — Ambulatory Visit (INDEPENDENT_AMBULATORY_CARE_PROVIDER_SITE_OTHER): Payer: Medicare Other

## 2017-01-08 DIAGNOSIS — Z23 Encounter for immunization: Secondary | ICD-10-CM

## 2017-01-09 DIAGNOSIS — D0439 Carcinoma in situ of skin of other parts of face: Secondary | ICD-10-CM | POA: Diagnosis not present

## 2017-01-09 DIAGNOSIS — Z0189 Encounter for other specified special examinations: Secondary | ICD-10-CM | POA: Diagnosis not present

## 2017-01-09 DIAGNOSIS — C44321 Squamous cell carcinoma of skin of nose: Secondary | ICD-10-CM | POA: Diagnosis not present

## 2017-01-20 ENCOUNTER — Ambulatory Visit (INDEPENDENT_AMBULATORY_CARE_PROVIDER_SITE_OTHER): Payer: Medicare Other | Admitting: *Deleted

## 2017-01-20 DIAGNOSIS — I255 Ischemic cardiomyopathy: Secondary | ICD-10-CM

## 2017-01-20 DIAGNOSIS — Z95810 Presence of automatic (implantable) cardiac defibrillator: Secondary | ICD-10-CM

## 2017-01-20 DIAGNOSIS — I5022 Chronic systolic (congestive) heart failure: Secondary | ICD-10-CM

## 2017-01-20 NOTE — Progress Notes (Signed)
Remote ICD transmission.   

## 2017-01-21 LAB — CUP PACEART REMOTE DEVICE CHECK
Brady Statistic AP VS Percent: 0.02 %
Brady Statistic AS VP Percent: 61.03 %
Brady Statistic AS VS Percent: 1.03 %
Brady Statistic RA Percent Paced: 33.51 %
Date Time Interrogation Session: 20181029052307
HIGH POWER IMPEDANCE MEASURED VALUE: 69 Ohm
HighPow Impedance: 47 Ohm
Implantable Lead Implant Date: 20010823
Implantable Lead Implant Date: 20091119
Implantable Lead Model: 6940
Lead Channel Impedance Value: 266 Ohm
Lead Channel Impedance Value: 399 Ohm
Lead Channel Impedance Value: 513 Ohm
Lead Channel Impedance Value: 665 Ohm
Lead Channel Pacing Threshold Amplitude: 0.625 V
Lead Channel Pacing Threshold Pulse Width: 0.4 ms
Lead Channel Pacing Threshold Pulse Width: 0.4 ms
Lead Channel Sensing Intrinsic Amplitude: 1.25 mV
Lead Channel Sensing Intrinsic Amplitude: 2.375 mV
Lead Channel Sensing Intrinsic Amplitude: 2.375 mV
Lead Channel Setting Pacing Amplitude: 0.5 V
Lead Channel Setting Pacing Amplitude: 1.75 V
Lead Channel Setting Pacing Amplitude: 2.5 V
Lead Channel Setting Pacing Pulse Width: 0.03 ms
Lead Channel Setting Pacing Pulse Width: 0.4 ms
MDC IDC LEAD IMPLANT DT: 20010823
MDC IDC LEAD LOCATION: 753858
MDC IDC LEAD LOCATION: 753859
MDC IDC LEAD LOCATION: 753860
MDC IDC LEAD SERIAL: 110961
MDC IDC MSMT BATTERY REMAINING LONGEVITY: 59 mo
MDC IDC MSMT BATTERY VOLTAGE: 2.98 V
MDC IDC MSMT LEADCHNL LV IMPEDANCE VALUE: 1083 Ohm
MDC IDC MSMT LEADCHNL RA IMPEDANCE VALUE: 513 Ohm
MDC IDC MSMT LEADCHNL RA PACING THRESHOLD AMPLITUDE: 0.75 V
MDC IDC MSMT LEADCHNL RA SENSING INTR AMPL: 1.25 mV
MDC IDC MSMT LEADCHNL RV PACING THRESHOLD AMPLITUDE: 2.5 V
MDC IDC MSMT LEADCHNL RV PACING THRESHOLD PULSEWIDTH: 0.4 ms
MDC IDC PG IMPLANT DT: 20150921
MDC IDC SET LEADCHNL RV SENSING SENSITIVITY: 0.3 mV
MDC IDC STAT BRADY AP VP PERCENT: 37.91 %
MDC IDC STAT BRADY RV PERCENT PACED: 87.68 %

## 2017-01-21 NOTE — Progress Notes (Signed)
EPIC Encounter for ICM Monitoring  Patient Name: [name] [name] is a 83 y.o. male Date: 01/21/2017 Primary Care Physican: Dettinger, Joshua A, MD Primary Cardiologist: Klein Electrophysiologist: Klein Dry Weight:148lbs  Bi-V Pacing: 87.7%          Heart Failure questions reviewed, pt asymptomatic.   Thoracic impedance is normal but was abnormal suggesting fluid accumulation from 12/25/2016 to 01/14/2017.  Prescribed dosage: Furosemide 40 mg 1 tablet daily  Labs: 09/28/2016 Creatinine 1.85, BUN 26, Potassium 4.7, Sodium 134, EGFR 33-38 07/01/2016 Creatinine 1.63, BUN 21, Potassium 4.5, Sodium 138, EGFR 38-44  Recommendations: No changes.  Encouraged to call for fluid symptoms.  Follow-up plan: ICM clinic phone appointment on 02/21/2017.    Copy of ICM check sent to Dr. Klein.   3 month ICM trend: 01/21/2017    1 Year ICM trend:      Laurie S Short, RN 01/21/2017 9:58 AM    

## 2017-01-28 ENCOUNTER — Encounter: Payer: Self-pay | Admitting: Cardiology

## 2017-02-10 ENCOUNTER — Other Ambulatory Visit: Payer: Self-pay | Admitting: Internal Medicine

## 2017-02-20 DIAGNOSIS — C4442 Squamous cell carcinoma of skin of scalp and neck: Secondary | ICD-10-CM | POA: Diagnosis not present

## 2017-02-20 DIAGNOSIS — Z08 Encounter for follow-up examination after completed treatment for malignant neoplasm: Secondary | ICD-10-CM | POA: Diagnosis not present

## 2017-02-20 DIAGNOSIS — Z85828 Personal history of other malignant neoplasm of skin: Secondary | ICD-10-CM | POA: Diagnosis not present

## 2017-02-20 DIAGNOSIS — C44222 Squamous cell carcinoma of skin of right ear and external auricular canal: Secondary | ICD-10-CM | POA: Diagnosis not present

## 2017-02-21 ENCOUNTER — Ambulatory Visit (INDEPENDENT_AMBULATORY_CARE_PROVIDER_SITE_OTHER): Payer: Medicare Other

## 2017-02-21 ENCOUNTER — Telehealth: Payer: Self-pay

## 2017-02-21 DIAGNOSIS — I5022 Chronic systolic (congestive) heart failure: Secondary | ICD-10-CM

## 2017-02-21 DIAGNOSIS — Z95810 Presence of automatic (implantable) cardiac defibrillator: Secondary | ICD-10-CM

## 2017-02-21 NOTE — Telephone Encounter (Signed)
Remote ICM transmission received.  Attempted call to patient and recording stated number has been disconnected.

## 2017-02-21 NOTE — Progress Notes (Signed)
EPIC Encounter for ICM Monitoring  Patient Name: [name] [name] is a 83 y.o. male Date: 02/21/2017 Primary Care Physican: Dettinger, Fransisca Kaufmann, MD Primary Cardiologist: Caryl Comes Electrophysiologist: Caryl Comes Dry Weight:Previous weight 148lbs  Bi-V Pacing: 78% (was87.7% at last ICM transmission)       Attempted call to patient and unable to reach.  Transmission reviewed.    Thoracic impedance abnormal suggesting fluid accumulation.  Prescribed dosage: Furosemide 40 mg 1 tablet daily  Labs: 09/28/2016 Creatinine 1.85, BUN 26, Potassium 4.7, Sodium 134, EGFR 33-38 07/01/2016 Creatinine 1.63, BUN 21, Potassium 4.5, Sodium 138, EGFR 38-44  Recommendations: NONE - Unable to reach.  Follow-up plan: ICM clinic phone appointment on 02/25/2017 to recheck fluid levels.  Office appointment scheduled 04/02/2017 with Chanetta Marshall, NP.  Copy of ICM check sent to Dr. Caryl Comes.  3 month ICM trend: 02/21/2017    1 Year ICM trend:       [name], RN 02/21/2017 8:08 AM

## 2017-02-25 ENCOUNTER — Telehealth: Payer: Self-pay | Admitting: Cardiology

## 2017-02-25 NOTE — Telephone Encounter (Signed)
LMOVM reminding pt to send remote transmission.   

## 2017-03-10 ENCOUNTER — Other Ambulatory Visit: Payer: Self-pay | Admitting: Internal Medicine

## 2017-03-31 NOTE — Progress Notes (Signed)
Electrophysiology Office Note Date: 04/02/2017  ID:  [name] [name], DOB [birth year], MRN [id number]  PCP: Dettinger, Fransisca Kaufmann, MD Electrophysiologist: Caryl Comes  CC: Routine ICD follow-up  [name] [name] is a 84 y.o. male seen today for Dr Caryl Comes  He presents today for routine electrophysiology followup.  Since last being seen in our clinic, the patient reports doing relatively well. Shortness of breath is stable.  He denies chest pain, palpitations, PND, orthopnea, nausea, vomiting, dizziness, syncope, edema, weight gain, or early satiety.  He has not had ICD shocks.   Device History: MDT dual chamber ICD implanted 2001 for ICM; upgrade to CRTD 2009; gen change 2015 History of appropriate therapy: No History of AAD therapy: Yes - amiodarone for PVC suppression, discontinued by patient 2/2 side effect concerns   Past Medical History:  Diagnosis Date  . CAD (coronary artery disease)    status post coronary bypass graft surgery in 1989 with documented occulsion of the vein graft to the diagonal branch of the left anterior descending in 2001.  Marland Kitchen COPD (chronic obstructive pulmonary disease) ([place])   . Hyperlipidemia   . Hypertension   . Ischemic cardiomyopathy    ejection fraction of 30% improved 35-40% 5/09  . Systolic congestive heart failure (HCC)    Class III systolic congestive heart, improved to class II, euvolemic.   Past Surgical History:  Procedure Laterality Date  . BI-VENTRICULAR IMPLANTABLE CARDIOVERTER DEFIBRILLATOR  (CRT-D)  2005; 2015   STJ CRTD implanted by Dr Caryl Comes  . BI-VENTRICULAR IMPLANTABLE CARDIOVERTER DEFIBRILLATOR UPGRADE N/A 12/13/2013   Procedure: BI-VENTRICULAR IMPLANTABLE CARDIOVERTER DEFIBRILLATOR UPGRADE;  Surgeon: [name], MD;  Location: [facility] CATH LAB;  Service: Cardiovascular;  Laterality: N/A;  . CORONARY ARTERY BYPASS GRAFT  1989    Current Outpatient Medications  Medication Sig Dispense Refill  . acetaminophen (TYLENOL) 500 MG tablet Take 500 mg by  mouth every 6 (six) hours as needed for headache.     Marland Kitchen aspirin 81 MG tablet Take 81 mg by mouth daily.     . furosemide (LASIX) 40 MG tablet TAKE 1 TABLET BY MOUTH ONCE DAILY 90 tablet 2  . losartan (COZAAR) 50 MG tablet TAKE ONE TABLET BY MOUTH ONCE DAILY 30 tablet 10  . metoprolol succinate (TOPROL XL) 25 MG 24 hr tablet Take 1 tablet (25 mg total) by mouth daily. 30 tablet 6  . Multiple Vitamins-Minerals (MULTIVITAMIN WITH MINERALS) tablet Take 1 tablet by mouth daily.     . nitroGLYCERIN (NITROSTAT) 0.4 MG SL tablet Place 1 tablet (0.4 mg total) under the tongue every 5 (five) minutes as needed for chest pain. 25 tablet 6  . Omega-3 Fatty Acids (FISH OIL) 1000 MG CPDR Take by mouth.    . spironolactone (ALDACTONE) 25 MG tablet TAKE 1/2 (ONE-HALF) TABLET BY MOUTH ONCE DAILY 45 tablet 3   No current facility-administered medications for this visit.     Allergies:   Pollen extract; Ramipril; and Crestor [rosuvastatin]   Social History: Social History   Socioeconomic History  . Marital status: Married    Spouse name: Not on file  . Number of children: 5  . Years of education: Not on file  . Highest education level: Not on file  Social Needs  . Financial resource strain: Not on file  . Food insecurity - worry: Not on file  . Food insecurity - inability: Not on file  . Transportation needs - medical: Not on file  . Transportation needs - non-medical: Not on  file  Occupational History    Employer: RETIRED  Tobacco Use  . Smoking status: Former Smoker    Packs/day: 1.00    Years: 43.00    Pack years: 43.00    Types: Cigarettes    Last attempt to quit: 12/07/1987    Years since quitting: 29.3  . Smokeless tobacco: Never Used  Substance and Sexual Activity  . Alcohol use: No  . Drug use: No  . Sexual activity: Not on file  Other Topics Concern  . Not on file  Social History Narrative  . Not on file    Family History: Family History  Problem Relation Age of Onset  .  Heart failure Mother     Review of Systems: All other systems reviewed and are otherwise negative except as noted above.   Physical Exam: VS:  BP 112/64   Pulse 67   Ht 5\' 7"  (1.702 m)   Wt 142 lb 12.8 oz (64.8 kg)   SpO2 95%   BMI 22.37 kg/m  , BMI Body mass index is 22.37 kg/m.  GEN- The patient is elderly appearing, alert and oriented x 3 today.   HEENT: normocephalic, atraumatic; sclera clear, conjunctiva pink; hearing intact; oropharynx clear; neck supple  Lungs- Clear to ausculation bilaterally, normal work of breathing.  No wheezes, rales, rhonchi Heart- Regular rate and rhythm, +ectopy GI- soft, non-tender, non-distended, bowel sounds present  Extremities- no clubbing, cyanosis, or edema  MS- no significant deformity or atrophy Skin- warm and dry, no rash or lesion; ICD pocket well healed Psych- euthymic mood, full affect Neuro- strength and sensation are intact  ICD interrogation- reviewed in detail today,  See PACEART report  EKG:  EKG is not ordered today.  Recent Labs: 09/28/2016: ALT 8; BUN 26; Creatinine, Ser 1.85; Potassium 4.7; Sodium 134 10/02/2016: BNP 390.2; Hemoglobin 13.7; Platelets 260; TSH 2.620   Wt Readings from Last 3 Encounters:  04/02/17 142 lb 12.8 oz (64.8 kg)  12/19/16 141 lb (64 kg)  10/02/16 139 lb (63 kg)     Other studies Reviewed: Additional studies/ records that were reviewed today include: Dr Olin Pia office notes   Assessment and Plan:  1.  Chronic systolic dysfunction euvolemic today Stable on an appropriate medical regimen Normal ICD function See Pace Art report No changes today  2.  CAD/ICM No recent ischemic symptoms Continue current therapy  3.  PVC's Burden stable  Previously intolerant of amiodarone    Current medicines are reviewed at length with the patient today.   The patient does not have concerns regarding his medicines.  The following changes were made today:  none  Labs/ tests ordered today include:  none   Disposition:   Follow up with [facility], Dr Caryl Comes 6 months    Signed, Chanetta Marshall, NP 04/02/2017 6:37 PM  [place] [address] [place] [address] [phone] (office) [phone] (fax)

## 2017-04-02 ENCOUNTER — Ambulatory Visit (INDEPENDENT_AMBULATORY_CARE_PROVIDER_SITE_OTHER): Payer: Medicare Other | Admitting: Nurse Practitioner

## 2017-04-02 ENCOUNTER — Encounter: Payer: Self-pay | Admitting: Nurse Practitioner

## 2017-04-02 VITALS — BP 112/64 | HR 67 | Ht 67.0 in | Wt 142.8 lb

## 2017-04-02 DIAGNOSIS — I493 Ventricular premature depolarization: Secondary | ICD-10-CM

## 2017-04-02 DIAGNOSIS — I255 Ischemic cardiomyopathy: Secondary | ICD-10-CM

## 2017-04-02 DIAGNOSIS — I5022 Chronic systolic (congestive) heart failure: Secondary | ICD-10-CM

## 2017-04-02 DIAGNOSIS — I2510 Atherosclerotic heart disease of native coronary artery without angina pectoris: Secondary | ICD-10-CM | POA: Diagnosis not present

## 2017-04-02 NOTE — Patient Instructions (Addendum)
Medication Instructions: Your physician recommends that you continue on your current medications as directed. Please refer to the Current Medication list given to you today.   Labwork: None ordered  Procedures/Testing: None ordered  Follow-Up: Remote monitoring is used to monitor your Pacemaker of ICD from home. This monitoring reduces the number of office visits required to check your device to one time per year. It allows [place] to keep an eye on the functioning of your device to ensure it is working properly. You are scheduled for a device check from home on 04/21/17. You may send your transmission at any time that day. If you have a wireless device, the transmission will be sent automatically. After your physician reviews your transmission, you will receive a postcard with your next transmission date.   Your physician wants you to follow-up in: 6 months with Dr. Caryl Comes.  You will receive a reminder letter in the mail two months in advance. If you don't receive a letter, please call our office to schedule the follow-up appointment.   Any Additional Special Instructions Will Be Listed Below (If Applicable).     If you need a refill on your cardiac medications before your next appointment, please call your pharmacy.

## 2017-04-04 ENCOUNTER — Other Ambulatory Visit: Payer: Self-pay | Admitting: Family Medicine

## 2017-04-04 ENCOUNTER — Ambulatory Visit (INDEPENDENT_AMBULATORY_CARE_PROVIDER_SITE_OTHER): Payer: Medicare Other

## 2017-04-04 ENCOUNTER — Encounter: Payer: Self-pay | Admitting: Family Medicine

## 2017-04-04 ENCOUNTER — Ambulatory Visit (INDEPENDENT_AMBULATORY_CARE_PROVIDER_SITE_OTHER): Payer: Medicare Other | Admitting: Family Medicine

## 2017-04-04 VITALS — BP 114/68 | HR 58 | Temp 97.1°F | Ht 67.0 in | Wt 144.0 lb

## 2017-04-04 DIAGNOSIS — J449 Chronic obstructive pulmonary disease, unspecified: Secondary | ICD-10-CM | POA: Diagnosis not present

## 2017-04-04 DIAGNOSIS — C44321 Squamous cell carcinoma of skin of nose: Secondary | ICD-10-CM

## 2017-04-04 DIAGNOSIS — E785 Hyperlipidemia, unspecified: Secondary | ICD-10-CM | POA: Diagnosis not present

## 2017-04-04 DIAGNOSIS — C4490 Unspecified malignant neoplasm of skin, unspecified: Secondary | ICD-10-CM

## 2017-04-04 DIAGNOSIS — C44300 Unspecified malignant neoplasm of skin of unspecified part of face: Secondary | ICD-10-CM

## 2017-04-04 DIAGNOSIS — I10 Essential (primary) hypertension: Secondary | ICD-10-CM

## 2017-04-04 NOTE — Progress Notes (Signed)
BP 114/68   Pulse (!) 58   Temp (!) 97.1 F (36.2 C) (Oral)   Ht _0  (1.702 m)   Wt 144 lb (65.3 kg)   BMI 22.55 kg/m    Subjective:    Patient ID: [name] [name], male    DOB: 1930-07-11, 84 y.o.   MRN: [id number]  HPI: [name] [name] is a 84 y.o. male presenting on 04/04/2017 for Chest x-ray (patient's dermatologist removed 3 "skin cancers" on face, recommends he have a chest x-ray.  )   HPI Hypertension Patient is currently on losartan and spironolactone and metoprolol, and their blood pressure today is 114/68. Patient denies any lightheadedness or dizziness. Patient denies headaches, blurred vision, chest pains, shortness of breath, or weakness. Denies any side effects from medication and is content with current medication.   Hyperlipidemia Patient is coming in for recheck of his hyperlipidemia. The patient is currently taking fish oil. They deny any issues with myalgias or history of liver damage from it. They deny any focal numbness or weakness or chest pain.   COPD recheck Patient is coming in for COPD recheck.  He says his breathing is been doing fine and is not having shortness of breath and wheezing more than his standard baseline.  He does have a little bit of cough but that is his chronic cough and he denies any major issues with it.  Skin cancer of face Patient is coming in because he has been diagnosed with a type of skin cancer on his face, he cannot recall what type it was but it was at Dr. Juel Burrow office and he received an order from Dr. Nevada Crane to come here and get a chest x-ray.  Relevant past medical, surgical, family and social history reviewed and updated as indicated. Interim medical history since our last visit reviewed. Allergies and medications reviewed and updated.  Review of Systems  Constitutional: Negative for chills and fever.  Eyes: Negative for discharge.  Respiratory: Negative for shortness of breath and wheezing.   Cardiovascular: Negative for chest  pain and leg swelling.  Musculoskeletal: Negative for back pain and gait problem.  Skin: Positive for wound (Well-healed lesions where skin cancer was removed from face). Negative for rash.  Neurological: Negative for dizziness, weakness, light-headedness and numbness.  All other systems reviewed and are negative.   Per HPI unless specifically indicated above     Objective:    BP 114/68   Pulse (!) 58   Temp (!) 97.1 F (36.2 C) (Oral)   Ht _1  (1.702 m)   Wt 144 lb (65.3 kg)   BMI 22.55 kg/m   Wt Readings from Last 3 Encounters:  04/04/17 144 lb (65.3 kg)  04/02/17 142 lb 12.8 oz (64.8 kg)  12/19/16 141 lb (64 kg)    Physical Exam  Constitutional: He is oriented to person, place, and time. He appears well-developed and well-nourished. No distress.  Eyes: Conjunctivae are normal. No scleral icterus.  Neck: Neck supple. No thyromegaly present.  Cardiovascular: Normal rate, regular rhythm, normal heart sounds and intact distal pulses.  No murmur heard. Pulmonary/Chest: Effort normal and breath sounds normal. No respiratory distress. He has no wheezes. He has no rales.  Musculoskeletal: Normal range of motion. He exhibits no edema.  Lymphadenopathy:    He has no cervical adenopathy.  Neurological: He is alert and oriented to person, place, and time. Coordination normal.  Skin: Skin is warm and dry. No rash noted. He is not diaphoretic.  Psychiatric: He has a normal mood and affect. His behavior is normal.  Nursing note and vitals reviewed.  Chest x-ray: No acute cardiopulmonary abnormalities were noted, await final read from radiologist.    Assessment & Plan:   Problem List Items Addressed This Visit      Cardiovascular and Mediastinum   Essential hypertension - Primary   Relevant Orders   CMP14+EGFR (Completed)     Respiratory   COPD (chronic obstructive pulmonary disease) (HCC)     Other   Hyperlipidemia LDL goal <100   Relevant Orders   Lipid panel  (Completed)    Other Visit Diagnoses    Skin cancer of face       Diagnosed with skin cancer at dermatology with Dr. Nevada Crane, patient does not know what type, Dr. Nevada Crane wants chest x-ray      Follow up plan: Return if symptoms worsen or fail to improve.  Counseling provided for all of the vaccine components Orders Placed This Encounter  Procedures  . CMP14+EGFR  . Lipid panel    Caryl Pina, MD [facility] 04/08/2017, 2:33 PM

## 2017-04-05 LAB — LIPID PANEL
CHOLESTEROL TOTAL: 267 mg/dL — AB (ref 100–199)
Chol/HDL Ratio: 5.9 ratio — ABNORMAL HIGH (ref 0.0–5.0)
HDL: 45 mg/dL (ref >39)
LDL Calculated: 186 mg/dL — ABNORMAL HIGH (ref 0–99)
Triglycerides: 182 mg/dL — ABNORMAL HIGH (ref 0–149)
VLDL CHOLESTEROL CAL: 36 mg/dL (ref 5–40)

## 2017-04-05 LAB — CMP14+EGFR
A/G RATIO: 1.8 (ref 1.2–2.2)
ALK PHOS: 51 IU/L (ref 39–117)
ALT: 11 IU/L (ref 0–44)
AST: 13 IU/L (ref 0–40)
Albumin: 4.4 g/dL (ref 3.5–4.7)
BILIRUBIN TOTAL: 0.7 mg/dL (ref 0.0–1.2)
BUN / CREAT RATIO: 11 (ref 10–24)
BUN: 15 mg/dL (ref 8–27)
CHLORIDE: 100 mmol/L (ref 96–106)
CO2: 24 mmol/L (ref 20–29)
Calcium: 10 mg/dL (ref 8.6–10.2)
Creatinine, Ser: 1.4 mg/dL — ABNORMAL HIGH (ref 0.76–1.27)
GFR calc non Af Amer: 46 mL/min/{1.73_m2} — ABNORMAL LOW (ref >59)
GFR, EST AFRICAN AMERICAN: 53 mL/min/{1.73_m2} — AB (ref >59)
GLOBULIN, TOTAL: 2.4 g/dL (ref 1.5–4.5)
GLUCOSE: 101 mg/dL — AB (ref 65–99)
POTASSIUM: 4.8 mmol/L (ref 3.5–5.2)
SODIUM: 139 mmol/L (ref 134–144)
Total Protein: 6.8 g/dL (ref 6.0–8.5)

## 2017-04-10 DIAGNOSIS — Z08 Encounter for follow-up examination after completed treatment for malignant neoplasm: Secondary | ICD-10-CM | POA: Diagnosis not present

## 2017-04-10 DIAGNOSIS — Z85828 Personal history of other malignant neoplasm of skin: Secondary | ICD-10-CM | POA: Diagnosis not present

## 2017-04-10 DIAGNOSIS — R350 Frequency of micturition: Secondary | ICD-10-CM | POA: Diagnosis not present

## 2017-04-10 DIAGNOSIS — N329 Bladder disorder, unspecified: Secondary | ICD-10-CM | POA: Diagnosis not present

## 2017-04-16 LAB — CUP PACEART INCLINIC DEVICE CHECK
Implantable Lead Implant Date: 20010823
Implantable Lead Implant Date: 20091119
Implantable Lead Location: 753859
Implantable Lead Location: 753860
Implantable Lead Model: 148
Implantable Lead Model: 6940
Implantable Lead Serial Number: 110961
MDC IDC LEAD IMPLANT DT: 20010823
MDC IDC LEAD LOCATION: 753858
MDC IDC PG IMPLANT DT: 20150921
MDC IDC SESS DTM: 20190123084319

## 2017-04-21 ENCOUNTER — Ambulatory Visit (INDEPENDENT_AMBULATORY_CARE_PROVIDER_SITE_OTHER): Payer: Medicare Other | Admitting: *Deleted

## 2017-04-21 DIAGNOSIS — I255 Ischemic cardiomyopathy: Secondary | ICD-10-CM

## 2017-04-21 NOTE — Progress Notes (Signed)
Remote ICD transmission.   

## 2017-04-22 LAB — CUP PACEART REMOTE DEVICE CHECK
Brady Statistic AP VP Percent: 44.54 %
Brady Statistic AP VS Percent: 0.02 %
Brady Statistic AS VS Percent: 1.1 %
Brady Statistic RA Percent Paced: 39.41 %
HIGH POWER IMPEDANCE MEASURED VALUE: 60 Ohm
HighPow Impedance: 42 Ohm
Implantable Lead Implant Date: 20010823
Implantable Lead Implant Date: 20091119
Implantable Lead Location: 753858
Implantable Lead Model: 6940
Implantable Pulse Generator Implant Date: 20150921
Lead Channel Impedance Value: 380 Ohm
Lead Channel Impedance Value: 532 Ohm
Lead Channel Impedance Value: 570 Ohm
Lead Channel Pacing Threshold Amplitude: 2.5 V
Lead Channel Pacing Threshold Pulse Width: 0.4 ms
Lead Channel Pacing Threshold Pulse Width: 0.4 ms
Lead Channel Sensing Intrinsic Amplitude: 1.75 mV
Lead Channel Sensing Intrinsic Amplitude: 2.375 mV
Lead Channel Sensing Intrinsic Amplitude: 2.375 mV
Lead Channel Setting Pacing Amplitude: 1.75 V
Lead Channel Setting Pacing Amplitude: 2.5 V
Lead Channel Setting Pacing Pulse Width: 0.4 ms
MDC IDC LEAD IMPLANT DT: 20010823
MDC IDC LEAD LOCATION: 753859
MDC IDC LEAD LOCATION: 753860
MDC IDC LEAD SERIAL: 110961
MDC IDC MSMT BATTERY REMAINING LONGEVITY: 52 mo
MDC IDC MSMT BATTERY VOLTAGE: 2.97 V
MDC IDC MSMT LEADCHNL LV IMPEDANCE VALUE: 1026 Ohm
MDC IDC MSMT LEADCHNL LV IMPEDANCE VALUE: 627 Ohm
MDC IDC MSMT LEADCHNL LV PACING THRESHOLD AMPLITUDE: 0.75 V
MDC IDC MSMT LEADCHNL RA PACING THRESHOLD AMPLITUDE: 0.75 V
MDC IDC MSMT LEADCHNL RA SENSING INTR AMPL: 1.75 mV
MDC IDC MSMT LEADCHNL RV IMPEDANCE VALUE: 247 Ohm
MDC IDC MSMT LEADCHNL RV PACING THRESHOLD PULSEWIDTH: 0.4 ms
MDC IDC SESS DTM: 20190128093827
MDC IDC SET LEADCHNL RV PACING AMPLITUDE: 0.5 V
MDC IDC SET LEADCHNL RV PACING PULSEWIDTH: 0.03 ms
MDC IDC SET LEADCHNL RV SENSING SENSITIVITY: 0.3 mV
MDC IDC STAT BRADY AS VP PERCENT: 54.34 %
MDC IDC STAT BRADY RV PERCENT PACED: 87.99 %

## 2017-04-23 ENCOUNTER — Encounter: Payer: Self-pay | Admitting: Cardiology

## 2017-05-06 ENCOUNTER — Ambulatory Visit (INDEPENDENT_AMBULATORY_CARE_PROVIDER_SITE_OTHER): Payer: Medicare Other

## 2017-05-06 DIAGNOSIS — I5022 Chronic systolic (congestive) heart failure: Secondary | ICD-10-CM | POA: Diagnosis not present

## 2017-05-06 DIAGNOSIS — Z95810 Presence of automatic (implantable) cardiac defibrillator: Secondary | ICD-10-CM

## 2017-05-06 NOTE — Progress Notes (Signed)
EPIC Encounter for ICM Monitoring  Patient Name: [name] [name] is a 84 y.o. male Date: 05/06/2017 Primary Care Physican: Dettinger, Fransisca Kaufmann, MD Primary Cardiologist: Caryl Comes Electrophysiologist: Caryl Comes Dry Weight:145lbs  Bi-V Pacing: 89.1%            Heart Failure questions reviewed, pt asymptomatic.   Thoracic impedance normal.  Prescribed dosage: Furosemide 40 mg 1 tablet daily  Labs: 04/04/2017 Creatinine 1.40, BUN 15, Potassium 4.8, Sodium 139, EGFR 46-53 09/28/2016 Creatinine 1.85, BUN 26, Potassium 4.7, Sodium 134, EGFR 33-38 07/01/2016 Creatinine 1.63, BUN 21, Potassium 4.5, Sodium 138, EGFR 38-44  Recommendations: No changes.   Encouraged to call for fluid symptoms.  Follow-up plan: ICM clinic phone appointment on 06/06/2017.    Copy of ICM check sent to Dr. Caryl Comes.   3 month ICM trend: 05/06/2017    1 Year ICM trend:       [name], RN 05/06/2017 10:37 AM

## 2017-06-06 ENCOUNTER — Ambulatory Visit (INDEPENDENT_AMBULATORY_CARE_PROVIDER_SITE_OTHER): Payer: Medicare Other

## 2017-06-06 ENCOUNTER — Telehealth: Payer: Self-pay

## 2017-06-06 DIAGNOSIS — Z95810 Presence of automatic (implantable) cardiac defibrillator: Secondary | ICD-10-CM

## 2017-06-06 DIAGNOSIS — I5022 Chronic systolic (congestive) heart failure: Secondary | ICD-10-CM | POA: Diagnosis not present

## 2017-06-06 NOTE — Telephone Encounter (Signed)
Remote ICM transmission received.  Attempted call to patient and left detailed message per DPR regarding transmission and next ICM scheduled for 07/07/2017.  Advised to return call for any fluid symptoms or questions.    

## 2017-06-06 NOTE — Progress Notes (Signed)
EPIC Encounter for ICM Monitoring  Patient Name: [name] [name] is a 84 y.o. male Date: 06/06/2017 Primary Care Physican: Dettinger, Fransisca Kaufmann, MD Primary Cardiologist: Caryl Comes Electrophysiologist: Caryl Comes Dry Weight:Previous weight 145lbs  Bi-V Pacing: 86.1%       Attempted call to patient and unable to reach.  Left detailed message regarding transmission.  Transmission reviewed.    Thoracic impedance normal.  Prescribed dosage: Furosemide 40 mg 1 tablet daily  Labs: 04/04/2017 Creatinine 1.40, BUN 15, Potassium 4.8, Sodium 139, EGFR 46-53 09/28/2016 Creatinine 1.85, BUN 26, Potassium 4.7, Sodium 134, EGFR 33-38 07/01/2016 Creatinine 1.63, BUN 21, Potassium 4.5, Sodium 138, EGFR 38-44  Recommendations: Left voice mail with ICM number and encouraged to call if experiencing any fluid symptoms.  Follow-up plan: ICM clinic phone appointment on 07/07/2017.    Copy of ICM check sent to Dr. Caryl Comes.   3 month ICM trend: 06/06/2017    1 Year ICM trend:       [name], RN 06/06/2017 10:53 AM

## 2017-06-09 ENCOUNTER — Telehealth: Payer: Self-pay | Admitting: *Deleted

## 2017-06-09 MED ORDER — CANDESARTAN CILEXETIL 16 MG PO TABS: 16.0000 mg | ORAL_TABLET | Freq: Every day | ORAL | 6 refills | Status: DC

## 2017-06-09 NOTE — Telephone Encounter (Signed)
Call was transferred from refills. Patient's wife states they were informed by [facility] that the losartan has been recalled and pt needs to ask for a substitute.  She is aware I will forward to Dr. Caryl Comes and our pharmacists and will send new prescription.   Request [facility] in [place].

## 2017-06-09 NOTE — Telephone Encounter (Signed)
Called back and informed patient's wife candesartan was sent in and to monitor BP for changes and call office with any concerns.  She will, she checks BP just about every day.

## 2017-06-09 NOTE — Telephone Encounter (Signed)
Would recommend change to candesartan 16mg  daily with history of ACEi and CHF indication. Have pt monitor pressures if able and call with any changes/issues.

## 2017-06-17 ENCOUNTER — Telehealth: Payer: Self-pay | Admitting: Internal Medicine

## 2017-06-17 ENCOUNTER — Other Ambulatory Visit: Payer: Self-pay | Admitting: Family Medicine

## 2017-06-17 NOTE — Telephone Encounter (Signed)
New Message   Pt c/o medication issue:  1. Name of Medication: losartan   2. How are you currently taking this medication (dosage and times per day)?  1 x a day 3. Are you having a reaction (difficulty breathing--STAT)?  no  4. What is your medication issue?  Medication was recalled and the pharmacy will not refill it - please call new prescription [facility] on [address]

## 2017-06-17 NOTE — Telephone Encounter (Signed)
Per pt Dr Janee Morn [facility] is on backorder Please change to something

## 2017-06-18 MED ORDER — OLMESARTAN MEDOXOMIL 40 MG PO TABS: 40.0000 mg | ORAL_TABLET | Freq: Every day | ORAL | 0 refills | Status: DC

## 2017-06-18 NOTE — Telephone Encounter (Signed)
Informed patient of his prescription change to Candesartan sent to [facility] in [place]. He had no additional questions.

## 2017-06-18 NOTE — Telephone Encounter (Signed)
Pt notified of change Verbalizes understanding 

## 2017-06-18 NOTE — Telephone Encounter (Signed)
lmtcb

## 2017-06-18 NOTE — Telephone Encounter (Signed)
Please let the patient know that I switched his medication for this month to Benicar and sent a prescription for that, he should be able to go back on his old medication in the future. Caryl Pina, MD [facility] 06/18/2017, 12:51 PM

## 2017-07-07 ENCOUNTER — Ambulatory Visit (INDEPENDENT_AMBULATORY_CARE_PROVIDER_SITE_OTHER): Payer: Medicare Other

## 2017-07-07 DIAGNOSIS — Z95810 Presence of automatic (implantable) cardiac defibrillator: Secondary | ICD-10-CM

## 2017-07-07 DIAGNOSIS — I5022 Chronic systolic (congestive) heart failure: Secondary | ICD-10-CM | POA: Diagnosis not present

## 2017-07-08 NOTE — Progress Notes (Signed)
EPIC Encounter for ICM Monitoring  Patient Name: [name] [name] is a 84 y.o. male Date: 07/08/2017 Primary Care Physican: Dettinger, Fransisca Kaufmann, MD Primary Cardiologist: Caryl Comes Electrophysiologist: Caryl Comes Dry Weight:Previous weight 145lbs  Bi-V Pacing: 90.4%         Attempted call to patient and unable to reach.  Left detailed message regarding transmission.  Transmission reviewed.    Thoracic impedance normal.  Prescribed dosage: Furosemide 40 mg 1 tablet daily  Labs: 04/04/2017 Creatinine 1.40, BUN 15, Potassium 4.8, Sodium 139, EGFR 46-53 09/28/2016 Creatinine 1.85, BUN 26, Potassium 4.7, Sodium 134, EGFR 33-38 07/01/2016 Creatinine 1.63, BUN 21, Potassium 4.5, Sodium 138, EGFR 38-44  Recommendations: Left voice mail with ICM number and encouraged to call if experiencing any fluid symptoms.  Follow-up plan: ICM clinic phone appointment on 08/07/2017.  Copy of ICM check sent to Dr. Caryl Comes.   3 month ICM trend: 07/07/2017    1 Year ICM trend:       [name], RN 07/08/2017 3:10 PM

## 2017-08-07 ENCOUNTER — Ambulatory Visit (INDEPENDENT_AMBULATORY_CARE_PROVIDER_SITE_OTHER): Payer: Medicare Other | Admitting: *Deleted

## 2017-08-07 ENCOUNTER — Telehealth: Payer: Self-pay

## 2017-08-07 DIAGNOSIS — I5022 Chronic systolic (congestive) heart failure: Secondary | ICD-10-CM | POA: Diagnosis not present

## 2017-08-07 DIAGNOSIS — Z95810 Presence of automatic (implantable) cardiac defibrillator: Secondary | ICD-10-CM | POA: Diagnosis not present

## 2017-08-07 DIAGNOSIS — I429 Cardiomyopathy, unspecified: Secondary | ICD-10-CM | POA: Diagnosis not present

## 2017-08-07 NOTE — Progress Notes (Signed)
Remote ICD transmission.   

## 2017-08-07 NOTE — Telephone Encounter (Signed)
Remote ICM transmission received.  Attempted call to patient and left detailed message, per DPR, regarding transmission and next ICM scheduled for 09/08/2017.  Advised to return call for any fluid symptoms or questions.

## 2017-08-07 NOTE — Progress Notes (Signed)
EPIC Encounter for ICM Monitoring  Patient Name: [name] [name] is a 84 y.o. male Date: 08/07/2017 Primary Care Physican: Dettinger, Joshua A, MD Primary Cardiologist: Klein Electrophysiologist: Klein Dry Weight:Previous weight145lbs  Bi-V Pacing: 90.8%        Attempted call to patient and unable to reach.  Left detailed message, per DPR, regarding transmission.  Transmission reviewed.    Thoracic impedance normal.  Prescribed dosage: Furosemide 40 mg 1 tablet daily  Labs: 04/04/2017 Creatinine 1.40, BUN 15, Potassium 4.8, Sodium 139, EGFR 46-53 09/28/2016 Creatinine 1.85, BUN 26, Potassium 4.7, Sodium 134, EGFR 33-38 07/01/2016 Creatinine 1.63, BUN 21, Potassium 4.5, Sodium 138, EGFR 38-44  Recommendations: Left voice mail with ICM number and encouraged to call if experiencing any fluid symptoms.  Follow-up plan: ICM clinic phone appointment on 09/08/2017.    Copy of ICM check sent to Dr. Klein.   3 month ICM trend: 08/07/2017    1 Year ICM trend:       Laurie S Short, RN 08/07/2017 3:37 PM   

## 2017-08-08 ENCOUNTER — Encounter: Payer: Self-pay | Admitting: Cardiology

## 2017-08-12 LAB — CUP PACEART REMOTE DEVICE CHECK
Brady Statistic AP VP Percent: 38.33 %
Brady Statistic AP VS Percent: 0.02 %
Brady Statistic RA Percent Paced: 35.04 %
Brady Statistic RV Percent Paced: 90.76 %
HighPow Impedance: 51 Ohm
HighPow Impedance: 70 Ohm
Implantable Lead Implant Date: 20010823
Implantable Lead Implant Date: 20010823
Implantable Lead Implant Date: 20091119
Implantable Lead Location: 753859
Implantable Lead Model: 148
Implantable Lead Model: 6940
Implantable Lead Serial Number: 110961
Implantable Pulse Generator Implant Date: 20150921
Lead Channel Impedance Value: 1083 Ohm
Lead Channel Impedance Value: 266 Ohm
Lead Channel Impedance Value: 399 Ohm
Lead Channel Impedance Value: 399 Ohm
Lead Channel Impedance Value: 532 Ohm
Lead Channel Impedance Value: 665 Ohm
Lead Channel Pacing Threshold Amplitude: 0.75 V
Lead Channel Pacing Threshold Pulse Width: 0.4 ms
Lead Channel Sensing Intrinsic Amplitude: 1.5 mV
Lead Channel Sensing Intrinsic Amplitude: 1.5 mV
Lead Channel Setting Pacing Amplitude: 0.5 V
Lead Channel Setting Pacing Pulse Width: 0.03 ms
Lead Channel Setting Sensing Sensitivity: 0.3 mV
MDC IDC LEAD LOCATION: 753858
MDC IDC LEAD LOCATION: 753860
MDC IDC MSMT BATTERY REMAINING LONGEVITY: 50 mo
MDC IDC MSMT BATTERY VOLTAGE: 2.92 V
MDC IDC MSMT LEADCHNL LV PACING THRESHOLD AMPLITUDE: 0.75 V
MDC IDC MSMT LEADCHNL LV PACING THRESHOLD PULSEWIDTH: 0.4 ms
MDC IDC MSMT LEADCHNL RV PACING THRESHOLD AMPLITUDE: 2.5 V
MDC IDC MSMT LEADCHNL RV PACING THRESHOLD PULSEWIDTH: 0.4 ms
MDC IDC MSMT LEADCHNL RV SENSING INTR AMPL: 3.625 mV
MDC IDC MSMT LEADCHNL RV SENSING INTR AMPL: 3.625 mV
MDC IDC SESS DTM: 20190516052508
MDC IDC SET LEADCHNL LV PACING AMPLITUDE: 1.75 V
MDC IDC SET LEADCHNL LV PACING PULSEWIDTH: 0.4 ms
MDC IDC SET LEADCHNL RA PACING AMPLITUDE: 2.5 V
MDC IDC STAT BRADY AS VP PERCENT: 60.38 %
MDC IDC STAT BRADY AS VS PERCENT: 1.28 %

## 2017-09-08 ENCOUNTER — Ambulatory Visit (INDEPENDENT_AMBULATORY_CARE_PROVIDER_SITE_OTHER): Payer: Medicare Other

## 2017-09-08 DIAGNOSIS — I5022 Chronic systolic (congestive) heart failure: Secondary | ICD-10-CM

## 2017-09-08 DIAGNOSIS — Z95810 Presence of automatic (implantable) cardiac defibrillator: Secondary | ICD-10-CM

## 2017-09-08 NOTE — Progress Notes (Signed)
EPIC Encounter for ICM Monitoring  Patient Name: [name] [name] is a 84 y.o. male Date: 09/08/2017 Primary Care Physican: Dettinger, Fransisca Kaufmann, MD Primary Cardiologist: Caryl Comes Electrophysiologist: Caryl Comes Dry Weight:Previous [id number]  Bi-V Pacing: 87%  Attempted call to patient and unable to reach.  Left detailed message, per DPR, regarding transmission.  Transmission reviewed.    Thoracic impedance normal.  Prescribed dosage: Furosemide 40 mg 1 tablet daily  Labs: 04/04/2017 Creatinine 1.40, BUN 15, Potassium 4.8, Sodium 139, EGFR 46-53 09/28/2016 Creatinine 1.85, BUN 26, Potassium 4.7, Sodium 134, EGFR 33-38 07/01/2016 Creatinine 1.63, BUN 21, Potassium 4.5, Sodium 138, EGFR 38-44  Recommendations: Left voice mail with ICM number and encouraged to call if experiencing any fluid symptoms.  Follow-up plan: ICM clinic phone appointment on 10/09/2017.   Copy of ICM check sent to Dr. Caryl Comes.   3 month ICM trend: 09/08/2017    1 Year ICM trend:       [name], RN 09/08/2017 11:56 AM

## 2017-09-09 ENCOUNTER — Telehealth: Payer: Self-pay

## 2017-09-09 NOTE — Telephone Encounter (Signed)
Remote ICM transmission received.  Attempted call to patient and left detailed message, per DPR, regarding transmission and next ICM scheduled for 10/09/2017.  Advised to return call for any fluid symptoms or questions.

## 2017-10-09 ENCOUNTER — Ambulatory Visit (INDEPENDENT_AMBULATORY_CARE_PROVIDER_SITE_OTHER): Payer: Medicare Other

## 2017-10-09 DIAGNOSIS — X32XXXD Exposure to sunlight, subsequent encounter: Secondary | ICD-10-CM | POA: Diagnosis not present

## 2017-10-09 DIAGNOSIS — Z95810 Presence of automatic (implantable) cardiac defibrillator: Secondary | ICD-10-CM

## 2017-10-09 DIAGNOSIS — I5022 Chronic systolic (congestive) heart failure: Secondary | ICD-10-CM

## 2017-10-09 DIAGNOSIS — Z85828 Personal history of other malignant neoplasm of skin: Secondary | ICD-10-CM | POA: Diagnosis not present

## 2017-10-09 DIAGNOSIS — L570 Actinic keratosis: Secondary | ICD-10-CM | POA: Diagnosis not present

## 2017-10-09 DIAGNOSIS — Z08 Encounter for follow-up examination after completed treatment for malignant neoplasm: Secondary | ICD-10-CM | POA: Diagnosis not present

## 2017-10-09 NOTE — Progress Notes (Signed)
EPIC Encounter for ICM Monitoring  Patient Name: [name] [name] is a 84 y.o. male Date: 10/09/2017 Primary Care Physican: Dettinger, Fransisca Kaufmann, MD Primary Cardiologist: Caryl Comes Electrophysiologist: Caryl Comes Dry Weight:Previous [id number]  Bi-V Pacing: 94%      Attempted call to patient and unable to reach.  Left detailed message, per DPR, regarding transmission.  Transmission reviewed.    Thoracic impedance normal.  Prescribed dosage: Furosemide 40 mg 1 tablet daily  Labs: 04/04/2017 Creatinine 1.40, BUN 15, Potassium 4.8, Sodium 139, EGFR 46-53 09/28/2016 Creatinine 1.85, BUN 26, Potassium 4.7, Sodium 134, EGFR 33-38 07/01/2016 Creatinine 1.63, BUN 21, Potassium 4.5, Sodium 138, EGFR 38-44   Recommendations: Left voice mail with ICM number and encouraged to call if experiencing any fluid symptoms.  Follow-up plan: ICM clinic phone appointment on 11/10/2017.    Copy of ICM check sent to Dr. Caryl Comes.   3 month ICM trend: 10/09/2017    1 Year ICM trend:       [name], RN 10/09/2017 9:47 AM

## 2017-10-29 ENCOUNTER — Ambulatory Visit (INDEPENDENT_AMBULATORY_CARE_PROVIDER_SITE_OTHER): Payer: Medicare Other

## 2017-10-29 VITALS — BP 128/73 | HR 70 | Temp 97.8°F | Ht 67.0 in | Wt 140.0 lb

## 2017-10-29 DIAGNOSIS — Z0000 Encounter for general adult medical examination without abnormal findings: Secondary | ICD-10-CM

## 2017-10-29 NOTE — Patient Instructions (Signed)
  Mr. Bradsher , Thank you for taking time to come for your Medicare Wellness Visit. I appreciate your ongoing commitment to your health goals. Please review the following plan we discussed and let me know if I can assist you in the future.   These are the goals we discussed: Goals    . DIET - INCREASE WATER INTAKE    . LIFESTYLE - DECREASE FALLS RISK       This is a list of the screening recommended for you and due dates:  Health Maintenance  Topic Date Due  . Flu Shot  03/25/2018*  . Tetanus Vaccine  04/04/2018*  . Pneumonia vaccines (1 of 2 - PCV13) 04/04/2018*  *Topic was postponed. The date shown is not the original due date.   Talk with Dr. Warrick Parisian at your next office visit about having your vaccines and your cardiologist recommendation.

## 2017-10-29 NOTE — Progress Notes (Signed)
Subjective:   [name] [name] is a 84 y.o. male who presents for an Initial Medicare Annual Wellness Visit.  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  He is married to his second wife after losing his first wife to breast cancer.  He has five children, four step-children, and thirteen grandchildren.    Review of Systems   Cardiac Risk Factors include: advanced age (>64men, >33 women);dyslipidemia;hypertension;male gender;Other (see comment)(COPD, heart failure)    Objective:    Today's Vitals   10/29/17 1439  BP: 128/73  Pulse: 70  Temp: 97.8 F (36.6 C)  TempSrc: Oral  Weight: 140 lb (63.5 kg)  Height: 5\' 7"  (1.702 m)   Body mass index is 21.93 kg/m.  Advanced Directives 10/29/2017 12/13/2013  Does Patient Have a Medical Advance Directive? No No  Would patient like information on creating a medical advance directive? Yes (MAU/Ambulatory/Procedural Areas - Information given) No - patient declined information    Current Medications (verified) Outpatient Encounter Medications as of 10/29/2017  Medication Sig  . acetaminophen (TYLENOL) 500 MG tablet Take 500 mg by mouth every 6 (six) hours as needed for headache.   Marland Kitchen aspirin 81 MG tablet Take 81 mg by mouth daily.   . candesartan (ATACAND) 16 MG tablet Take 1 tablet (16 mg total) by mouth daily.  . furosemide (LASIX) 40 MG tablet TAKE 1 TABLET BY MOUTH ONCE DAILY  . metoprolol succinate (TOPROL XL) 25 MG 24 hr tablet Take 1 tablet (25 mg total) by mouth daily.  . Multiple Vitamins-Minerals (MULTIVITAMIN WITH MINERALS) tablet Take 1 tablet by mouth daily.   . nitroGLYCERIN (NITROSTAT) 0.4 MG SL tablet Place 1 tablet (0.4 mg total) under the tongue every 5 (five) minutes as needed for chest pain.  Marland Kitchen olmesartan (BENICAR) 40 MG tablet Take 1 tablet (40 mg total) by mouth daily.  . Omega-3 Fatty Acids (FISH OIL) 1000 MG CPDR Take by mouth.  . spironolactone (ALDACTONE) 25 MG tablet TAKE 1/2 (ONE-HALF)  TABLET BY MOUTH ONCE DAILY   No facility-administered encounter medications on file as of 10/29/2017.     Allergies (verified) Pollen extract; Ramipril; and Crestor [rosuvastatin]   History: Past Medical History:  Diagnosis Date  . CAD (coronary artery disease)    status post coronary bypass graft surgery in 1989 with documented occulsion of the vein graft to the diagonal branch of the left anterior descending in 2001.  Marland Kitchen COPD (chronic obstructive pulmonary disease) ([place])   . Hyperlipidemia   . Hypertension   . Ischemic cardiomyopathy    ejection fraction of 30% improved 35-40% 5/09  . Systolic congestive heart failure (H[facility])    Class III systolic congestive heart, improved to class II, euvolemic.   Past Surgical History:  Procedure Laterality Date  . BI-VENTRICULAR IMPLANTABLE CARDIOVERTER DEFIBRILLATOR  (CRT-D)  2005; 2015   STJ CRTD implanted by Dr [name] [name]  . BI-VENTRICULAR IMPLANTABLE CARDIOVERTER DEFIBRILLATOR UPGRADE N/A 12/13/2013   Procedure: BI-VENTRICULAR IMPLANTABLE CARDIOVERTER DEFIBRILLATOR UPGRADE;  Surgeon: [name] Sprang, MD;  Location: [facility] CATH LAB;  Service: Cardiovascular;  Laterality: N/A;  . CORONARY ARTERY BYPASS GRAFT  1989   Family History  Problem Relation Age of Onset  . Heart failure Mother    Social History   Socioeconomic History  . Marital status: Married    Spouse name: Not on file  . Number of children: 5  . Years of education: Not on file  . Highest education level: Not  on file  Occupational History    Employer: RETIRED  Social Needs  . Financial resource strain: Not hard at all  . Food insecurity:    Worry: Never true    Inability: Never true  . Transportation needs:    Medical: No    Non-medical: No  Tobacco Use  . Smoking status: Former Smoker    Packs/day: 1.00    Years: 43.00    Pack years: 43.00    Types: Cigarettes    Last attempt to quit: 12/07/1987    Years since quitting: 29.9  . Smokeless tobacco: Never Used  Substance  and Sexual Activity  . Alcohol use: No  . Drug use: No  . Sexual activity: Not on file  Lifestyle  . Physical activity:    Days per week: 3 days    Minutes per session: 30 min  . Stress: Not at all  Relationships  . Social connections:    Talks on phone: Twice a week    Gets together: Once a week    Attends religious service: More than 4 times per year    Active member of club or organization: Yes    Attends meetings of clubs or organizations: More than 4 times per year    Relationship status: Married  Other Topics Concern  . Not on file  Social History Narrative  . Not on file   Clinical Intake:    Activities of Daily Living In your present state of health, do you have any difficulty performing the following activities: 10/29/2017  Hearing? N  Vision? N  Difficulty concentrating or making decisions? N  Walking or climbing stairs? N  Dressing or bathing? N  Doing errands, shopping? N  Preparing Food and eating ? N  Using the Toilet? N  In the past six months, have you accidently leaked urine? N  Do you have problems with loss of bowel control? N  Managing your Medications? N  Managing your Finances? N  Housekeeping or managing your Housekeeping? N  Some recent data might be hidden     Immunizations and Health Maintenance Immunization History  Administered Date(s) Administered  . Influenza, High Dose Seasonal PF 01/08/2017  . Influenza,inj,Quad PF,6+ Mos 12/26/2015   Patient is due for his Tdap and Prevnar vaccine, but declines getting today because his cardiologist told him after age 34 he did not need to have any vaccinations.    Patient Care Team: Dettinger, Fransisca Kaufmann, MD as PCP - General (Family Medicine)  Indicate any recent Medical Services you may have received from other than [facility] providers in the past year (date may be approximate).    Assessment:   This is a routine wellness examination for [name] [name].   Dietary issues and exercise activities  discussed: Current Exercise Habits: The patient does not participate in regular exercise at present  Goals    . DIET - INCREASE WATER INTAKE    . LIFESTYLE - DECREASE FALLS RISK      Depression Screen PHQ 2/9 Scores 10/29/2017 04/04/2017 10/02/2016 09/28/2016  PHQ - 2 Score 0 0 3 0  PHQ- 9 Score - - 9 -    Fall Risk Fall Risk  10/29/2017 04/04/2017 10/02/2016 09/28/2016 04/17/2015  Falls in the past year? No No No No No    Is the patient's home free of loose throw rugs in walkways, pet beds, electrical cords, etc?   Yes      Grab bars in the bathroom? No  Handrails on the stairs?   Yes        Cognitive Function: MMSE - Mini Mental State Exam 10/29/2017  Orientation to time 4  Orientation to Place 5  Registration 3  Attention/ Calculation 5  Recall 2  Language- name 2 objects 2  Language- repeat 1  Language- follow 3 step command 3  Language- read & follow direction 1  Write a sentence 1  Copy design 1  Total score 28    Patient performed well on his MMSE, scoring 28 out of 30 available points.    Screening Tests Health Maintenance  Topic Date Due  . INFLUENZA VACCINE  03/25/2018 (Originally 10/23/2017)  . TETANUS/TDAP  04/04/2018 (Originally 04/29/1949)  . PNA vac Low Risk Adult (1 of 2 - PCV13) 04/04/2018 (Originally 04/28/1997)     Cancer Screenings: Lung: Low Dose CT Chest recommended if Age 89-80 years, 30 pack-year currently smoking OR have quit w/in 15years. Patient does not qualify. Colorectal: Past age        Plan:     Follow up with PCP Confirm with PCP need for vaccinations Reduce risk of falls Add fruits and vegetables to diet  I have personally reviewed and noted the following in the patient's chart:   . Medical and social history . Use of alcohol, tobacco or illicit drugs  . Current medications and supplements . Functional ability and status . Nutritional status . Physical activity . Advanced directives . List of other  physicians . Hospitalizations, surgeries, and ER visits in previous 12 months . Vitals . Screenings to include cognitive, depression, and falls . Referrals and appointments  In addition, I have reviewed and discussed with patient certain preventive protocols, quality metrics, and best practice recommendations. A written personalized care plan for preventive services as well as general preventive health recommendations were provided to patient.     Burnadette Pop, LPN   11/27/1738

## 2017-11-10 ENCOUNTER — Ambulatory Visit (INDEPENDENT_AMBULATORY_CARE_PROVIDER_SITE_OTHER): Payer: Medicare Other

## 2017-11-10 ENCOUNTER — Ambulatory Visit (INDEPENDENT_AMBULATORY_CARE_PROVIDER_SITE_OTHER): Payer: Medicare Other | Admitting: *Deleted

## 2017-11-10 DIAGNOSIS — I255 Ischemic cardiomyopathy: Secondary | ICD-10-CM

## 2017-11-10 DIAGNOSIS — Z95810 Presence of automatic (implantable) cardiac defibrillator: Secondary | ICD-10-CM | POA: Diagnosis not present

## 2017-11-10 DIAGNOSIS — I5022 Chronic systolic (congestive) heart failure: Secondary | ICD-10-CM

## 2017-11-10 NOTE — Progress Notes (Signed)
EPIC Encounter for ICM Monitoring  Patient Name: [name] [name] is a 84 y.o. male Date: 11/10/2017 Primary Care Physican: Dettinger, Fransisca Kaufmann, MD Primary Cardiologist: Caryl Comes Electrophysiologist: Caryl Comes Dry Weight:Previous [id number]  Bi-V Pacing: 93.1%  VT-NS (>4 beats, >162 bpm) 2       Spoke with wife due to patient was not home. Heart Failure questions reviewed, pt asymptomatic.   Thoracic impedance normal.  Prescribed dosage: Furosemide 40 mg 1 tablet daily  Labs: 04/04/2017 Creatinine 1.40, BUN 15, Potassium 4.8, Sodium 139, EGFR 46-53 09/28/2016 Creatinine 1.85, BUN 26, Potassium 4.7, Sodium 134, EGFR 33-38 07/01/2016 Creatinine 1.63, BUN 21, Potassium 4.5, Sodium 138, EGFR 38-44  Recommendations: No changes.   Encouraged to call for fluid symptoms.  Follow-up plan: ICM clinic phone appointment on 12/18/2017.   Advised to call office to schedule a 6 month follow up appointment with Dr Caryl Comes (last visit 03/2017 with Chanetta Marshall, NP)    Copy of ICM check sent to Dr. Caryl Comes.   3 month ICM trend: 11/10/2017    1 Year ICM trend:       [name], RN 11/10/2017 11:08 AM

## 2017-11-11 NOTE — Progress Notes (Signed)
Remote ICD transmission.   

## 2017-12-05 LAB — CUP PACEART REMOTE DEVICE CHECK
Brady Statistic AP VS Percent: 0.01 %
Brady Statistic AS VP Percent: 60.38 %
Brady Statistic AS VS Percent: 1.27 %
Brady Statistic RA Percent Paced: 35.94 %
Brady Statistic RV Percent Paced: 93.06 %
HighPow Impedance: 50 Ohm
HighPow Impedance: 72 Ohm
Implantable Lead Implant Date: 20010823
Implantable Lead Implant Date: 20010823
Implantable Lead Location: 753860
Implantable Lead Model: 6940
Implantable Pulse Generator Implant Date: 20150921
Lead Channel Impedance Value: 266 Ohm
Lead Channel Impedance Value: 437 Ohm
Lead Channel Impedance Value: 589 Ohm
Lead Channel Impedance Value: 665 Ohm
Lead Channel Pacing Threshold Amplitude: 0.75 V
Lead Channel Pacing Threshold Amplitude: 2.5 V
Lead Channel Pacing Threshold Pulse Width: 0.4 ms
Lead Channel Pacing Threshold Pulse Width: 0.4 ms
Lead Channel Sensing Intrinsic Amplitude: 1.375 mV
Lead Channel Sensing Intrinsic Amplitude: 1.375 mV
Lead Channel Sensing Intrinsic Amplitude: 2.375 mV
Lead Channel Sensing Intrinsic Amplitude: 2.375 mV
Lead Channel Setting Pacing Amplitude: 0.5 V
Lead Channel Setting Pacing Amplitude: 1.75 V
Lead Channel Setting Pacing Amplitude: 2.5 V
Lead Channel Setting Pacing Pulse Width: 0.03 ms
Lead Channel Setting Pacing Pulse Width: 0.4 ms
MDC IDC LEAD IMPLANT DT: 20091119
MDC IDC LEAD LOCATION: 753858
MDC IDC LEAD LOCATION: 753859
MDC IDC LEAD SERIAL: 110961
MDC IDC MSMT BATTERY REMAINING LONGEVITY: 44 mo
MDC IDC MSMT BATTERY VOLTAGE: 2.94 V
MDC IDC MSMT LEADCHNL LV IMPEDANCE VALUE: 1102 Ohm
MDC IDC MSMT LEADCHNL LV PACING THRESHOLD PULSEWIDTH: 0.4 ms
MDC IDC MSMT LEADCHNL RA IMPEDANCE VALUE: 456 Ohm
MDC IDC MSMT LEADCHNL RA PACING THRESHOLD AMPLITUDE: 0.75 V
MDC IDC SESS DTM: 20190819041607
MDC IDC SET LEADCHNL RV SENSING SENSITIVITY: 0.3 mV
MDC IDC STAT BRADY AP VP PERCENT: 38.34 %

## 2017-12-18 ENCOUNTER — Ambulatory Visit (INDEPENDENT_AMBULATORY_CARE_PROVIDER_SITE_OTHER): Payer: Medicare Other

## 2017-12-18 DIAGNOSIS — I5022 Chronic systolic (congestive) heart failure: Secondary | ICD-10-CM | POA: Diagnosis not present

## 2017-12-18 DIAGNOSIS — Z95810 Presence of automatic (implantable) cardiac defibrillator: Secondary | ICD-10-CM | POA: Diagnosis not present

## 2017-12-18 NOTE — Progress Notes (Signed)
EPIC Encounter for ICM Monitoring  Patient Name: [name] [name] is a 84 y.o. male Date: 12/18/2017 Primary Care Physican: Dettinger, Fransisca Kaufmann, MD Primary Cardiologist: Caryl Comes Electrophysiologist: Caryl Comes Dry Weight:145lbs  Bi-V Pacing: 93.1%       Heart Failure questions reviewed, pt asymptomatic.   Thoracic impedance normal.  Prescribed: Furosemide 40 mg 1 tablet daily  Labs: 04/04/2017 Creatinine 1.40, BUN 15, Potassium 4.8, Sodium 139, EGFR 46-53 09/28/2016 Creatinine 1.85, BUN 26, Potassium 4.7, Sodium 134, EGFR 33-38 07/01/2016 Creatinine 1.63, BUN 21, Potassium 4.5, Sodium 138, EGFR 38-44  Recommendations: No changes.   Encouraged to call for fluid symptoms.  Follow-up plan: ICM clinic phone appointment on 01/19/2018.      Copy of ICM check sent to Dr. Caryl Comes.   3 month ICM trend: 12/18/2017    1 Year ICM trend:       [name], RN 12/18/2017 4:01 PM

## 2018-01-09 ENCOUNTER — Ambulatory Visit (INDEPENDENT_AMBULATORY_CARE_PROVIDER_SITE_OTHER): Payer: Medicare Other

## 2018-01-09 DIAGNOSIS — Z23 Encounter for immunization: Secondary | ICD-10-CM

## 2018-01-19 ENCOUNTER — Ambulatory Visit (INDEPENDENT_AMBULATORY_CARE_PROVIDER_SITE_OTHER): Payer: Medicare Other

## 2018-01-19 DIAGNOSIS — I5022 Chronic systolic (congestive) heart failure: Secondary | ICD-10-CM | POA: Diagnosis not present

## 2018-01-19 DIAGNOSIS — Z95810 Presence of automatic (implantable) cardiac defibrillator: Secondary | ICD-10-CM | POA: Diagnosis not present

## 2018-01-19 NOTE — Progress Notes (Signed)
EPIC Encounter for ICM Monitoring  Patient Name: [name] [name] is a 84 y.o. male Date: 01/19/2018 Primary Care Physican: Dettinger, Fransisca Kaufmann, MD Primary Cardiologist: Caryl Comes Electrophysiologist: Caryl Comes Dry Weight:145lbs  Bi-V Pacing: 93.3%           Spoke with wife. Heart Failure questions reviewed, pt asymptomatic.   Thoracic impedance normal.   Prescribed: Furosemide 40 mg 1 tablet daily  Labs: 04/04/2017 Creatinine 1.40, BUN 15, Potassium 4.8, Sodium 139, EGFR 46-53 09/28/2016 Creatinine 1.85, BUN 26, Potassium 4.7, Sodium 134, EGFR 33-38 07/01/2016 Creatinine 1.63, BUN 21, Potassium 4.5, Sodium 138, EGFR 38-44  Recommendations: No changes.  Encouraged to call for fluid symptoms.  Follow-up plan: ICM clinic phone appointment on 02/23/2018.    Copy of ICM check sent to Dr. Caryl Comes.   3 month ICM trend: 01/19/2018    1 Year ICM trend:       [name], RN 01/19/2018 10:08 AM

## 2018-02-09 ENCOUNTER — Ambulatory Visit (INDEPENDENT_AMBULATORY_CARE_PROVIDER_SITE_OTHER): Payer: Medicare Other

## 2018-02-09 DIAGNOSIS — I255 Ischemic cardiomyopathy: Secondary | ICD-10-CM | POA: Diagnosis not present

## 2018-02-09 NOTE — Progress Notes (Signed)
Remote ICD transmission.   

## 2018-02-11 ENCOUNTER — Other Ambulatory Visit: Payer: Self-pay | Admitting: Internal Medicine

## 2018-02-12 ENCOUNTER — Encounter: Payer: Self-pay | Admitting: Cardiology

## 2018-02-23 ENCOUNTER — Ambulatory Visit (INDEPENDENT_AMBULATORY_CARE_PROVIDER_SITE_OTHER): Payer: Medicare Other

## 2018-02-23 ENCOUNTER — Telehealth: Payer: Self-pay

## 2018-02-23 DIAGNOSIS — I5022 Chronic systolic (congestive) heart failure: Secondary | ICD-10-CM | POA: Diagnosis not present

## 2018-02-23 DIAGNOSIS — Z95810 Presence of automatic (implantable) cardiac defibrillator: Secondary | ICD-10-CM | POA: Diagnosis not present

## 2018-02-23 NOTE — Progress Notes (Signed)
EPIC Encounter for ICM Monitoring  Patient Name: RENNE PLATTS is a 84 y.o. male Date: 02/23/2018 Primary Care Physican: Dettinger, Fransisca Kaufmann, MD Primary Cardiologist: [name] [name] Electrophysiologist: [name] [name] Pacing: 93% Last Weight: 145lbs  Today's Weight: unknown       Attempted call to patient and unable to reach.    Transmission reviewed.    Thoracic impedance normal.   Prescribed: Furosemide 40 mg 1 tablet daily  Labs: 04/04/2017 Creatinine 1.40, BUN 15, Potassium 4.8, Sodium 139, EGFR 46-53  Recommendations: Unable to reach.  Follow-up plan: ICM clinic phone appointment on 03/26/2018.    Copy of ICM check sent to Dr. Caryl [name].   3 month ICM trend: 02/23/2018    1 Year ICM trend:       [name] Billings, RN 02/23/2018 2:24 PM

## 2018-02-23 NOTE — Telephone Encounter (Signed)
Remote ICM transmission received.  Attempted call to patient regarding ICM remote transmission and no answer or answering machine. 

## 2018-03-26 ENCOUNTER — Ambulatory Visit (INDEPENDENT_AMBULATORY_CARE_PROVIDER_SITE_OTHER): Payer: Medicare Other

## 2018-03-26 DIAGNOSIS — I5022 Chronic systolic (congestive) heart failure: Secondary | ICD-10-CM | POA: Diagnosis not present

## 2018-03-26 DIAGNOSIS — Z95810 Presence of automatic (implantable) cardiac defibrillator: Secondary | ICD-10-CM

## 2018-03-27 NOTE — Progress Notes (Signed)
Per Chanetta Marshall, NP, recheck fluid levels next week.  No changes in medications today since appears impedance is starting to trend toward baseline.

## 2018-03-27 NOTE — Progress Notes (Signed)
Call to wife.  Advised Chanetta Marshall, NP recommendations is to to have patient limit salt intake and send update remote transmission 03/30/2018 to recheck fluid levels.  Advised to use ER if fluid symptoms develop.

## 2018-03-27 NOTE — Progress Notes (Signed)
EPIC Encounter for ICM Monitoring  Patient Name: LESLEY GALENTINE is a 85 y.o. male Date: 03/27/2018 Primary Care Physican: Dettinger, Fransisca Kaufmann, MD Primary Cardiologist: Caryl Comes Electrophysiologist: Vergie Living Pacing: 91.8% Last Weight: 145lbs  Today's Weight: 39      Spoke with wife.  Heart Failure questions reviewed, pt asymptomatic.  She said patient is feeling fine.    Thoracic impedance abnormal suggesting fluid accumulation starting 02/24/2018.   Prescribed: Furosemide 40 mg 1 tablet daily  Labs: 04/04/2017 Creatinine 1.40, BUN 15, Potassium 4.8, Sodium 139, EGFR 46-53  Recommendations: Will send copy to Dr Caryl Comes and Chanetta Marshall for review and recommendations.   Follow-up plan: ICM clinic phone appointment on 03/30/2018 (manual send) to recheck fluid levels.  Will have scheduler call wife to schedule appt with Dr Caryl Comes (03/2017 last visit with Chanetta Marshall, NP).    Copy of ICM check sent to Dr. Caryl Comes.   3 month ICM trend: 03/26/2018    1 Year ICM trend:       [name], RN 03/27/2018 9:43 AM

## 2018-03-30 ENCOUNTER — Ambulatory Visit (INDEPENDENT_AMBULATORY_CARE_PROVIDER_SITE_OTHER): Payer: Medicare Other

## 2018-03-30 DIAGNOSIS — Z95810 Presence of automatic (implantable) cardiac defibrillator: Secondary | ICD-10-CM

## 2018-03-30 DIAGNOSIS — I5022 Chronic systolic (congestive) heart failure: Secondary | ICD-10-CM

## 2018-03-31 NOTE — Progress Notes (Signed)
EPIC Encounter for ICM Monitoring  Patient Name: [name] [name] is a 85 y.o. male Date: 03/31/2018 Primary Care Physican: Dettinger, Fransisca Kaufmann, MD Primary Cardiologist: Caryl Comes Electrophysiologist: Vergie Living Pacing: 92.8% Last Weight:145lbs Today's Weight: 145 lbs                                                          Spoke with wife.  Heart Failure questions reviewed, pt asymptomatic.     Thoracic impedance normal.  Prescribed: Furosemide 40 mg 1 tablet daily  Labs: 04/04/2017 Creatinine 1.40, BUN 15, Potassium 4.8, Sodium 139, EGFR 46-53  Recommendations:  Encouraged to call for any fluid symptoms.   Follow-up plan: ICM clinic phone appointment on 05/04/2018.  Office appointment scheduled 04/03/2018.  Copy of ICM check sent to Dr. Caryl Comes.   3 month ICM trend: 03/30/2018    1 Year ICM trend:       [name], RN 03/31/2018 2:27 PM

## 2018-04-02 ENCOUNTER — Ambulatory Visit: Payer: Medicare Other | Admitting: Internal Medicine

## 2018-04-02 ENCOUNTER — Encounter: Payer: Self-pay | Admitting: Internal Medicine

## 2018-04-02 VITALS — BP 144/82 | HR 71 | Ht 67.0 in | Wt 140.2 lb

## 2018-04-02 DIAGNOSIS — Z95810 Presence of automatic (implantable) cardiac defibrillator: Secondary | ICD-10-CM

## 2018-04-02 DIAGNOSIS — I5022 Chronic systolic (congestive) heart failure: Secondary | ICD-10-CM | POA: Diagnosis not present

## 2018-04-02 NOTE — Progress Notes (Signed)
Patient Care Team: Dettinger, Fransisca Kaufmann, MD as PCP - General (Family Medicine)   HPI  [name] [name] is a 85 y.o. male seen in followup for Ischemic cardiomyopathy congestive heart failure for which he is s/p CRT-D implantation. He is s/p CABG and most recent EF was about 30-35% in fall of 2009.   He has stable low amplitude R waves  He says his breathing is much better since his AV optimization echo; he is hoping that there is greater benefit to be gained.   His device reached ERI 9/15 he underwent generator replacement at that time choosing to have CRT-D implanted     He has struggled with fatigue. He has significant breathlessness. It is his and his wife's impression that is worsening. He's had this enormous PVC burden (15-20%) and has been in the past reluctant to undertake therapy for this.  More recently we started him on amiodarone. He was unable to tolerate the 400 mg dose but has tolerated 200 mg dose. This is been associated with significant improvement in exercise tolerance.  Patient denies symptoms of GI intolerance, sun sensitivity, neurological symptoms attributable to amiodarone.  He stopped taking amiodarone on his own. According to the visit 4/18    He underwent catheterization in July of 2011. He had total occlusion of the vein graft to the right coronary artery and total occlusion of the vein graft to the diagonal branch. Ejection fraction was 40%.  SR  Date Cr K TSH  3/15    4.9    7/18  1.85 4.7 2.62  1/19 1.4 4.8      The patient denies chest pain, worsening shortness of breath, nocturnal dyspnea, orthopnea or peripheral edema.  There have been no palpitations, lightheadedness or syncope.     Past Surgical History:  Procedure Laterality Date  . BI-VENTRICULAR IMPLANTABLE CARDIOVERTER DEFIBRILLATOR  (CRT-D)  2005; 2015   STJ CRTD implanted by Dr Caryl Comes  . BI-VENTRICULAR IMPLANTABLE CARDIOVERTER DEFIBRILLATOR UPGRADE N/A 12/13/2013   Procedure:  BI-VENTRICULAR IMPLANTABLE CARDIOVERTER DEFIBRILLATOR UPGRADE;  Surgeon: [name], MD;  Location: [facility] CATH LAB;  Service: Cardiovascular;  Laterality: N/A;  . CORONARY ARTERY BYPASS GRAFT  1989    Current Outpatient Medications  Medication Sig Dispense Refill  . aspirin 81 MG tablet Take 81 mg by mouth daily.     . furosemide (LASIX) 40 MG tablet TAKE 1 TABLET BY MOUTH ONCE DAILY 90 tablet 2  . Ginseng 250 MG CAPS Take 1 capsule by mouth daily.    . Multiple Vitamins-Minerals (MULTIVITAMIN WITH MINERALS) tablet Take 1 tablet by mouth daily.     . nitroGLYCERIN (NITROSTAT) 0.4 MG SL tablet Place 1 tablet (0.4 mg total) under the tongue every 5 (five) minutes as needed for chest pain. 25 tablet 6  . Omega-3 Fatty Acids (FISH OIL) 1000 MG CPDR Take 1 capsule by mouth daily.     Marland Kitchen spironolactone (ALDACTONE) 25 MG tablet Take 0.5 tablets (12.5 mg total) by mouth daily. Please call and schedule a one year follow up appointment for further refills 1st attempt 15 tablet 1   No current facility-administered medications for this visit.     Allergies  Allergen Reactions  . Pollen Extract   . Ramipril Hives and Swelling  . Crestor [Rosuvastatin] Anxiety    Review of Systems negative except from HPI and PMH  Physical Exam BP (!) 144/82   Pulse 71   Ht 5\' 7"  (1.702 m)   Wt  140 lb 3.2 oz (63.6 kg)   SpO2 94%   BMI 21.96 kg/m  Well developed and nourished in no acute distress HENT normal Neck supple with JVP 7-8 Clear Regular rate and rhythm, 2/6 murmurs or gallops Abd-soft with active BS No Clubbing cyanosis edema Skin-warm and dry A & Oriented  Grossly normal sensory and motor function    ECG demonstrates  AV pacing 01/03/43 PVCs  Assessment and  Plan  Ischemic cardiomyopathy  Congestive heart failure-chronic-systolic  CRT-D- [employer] The patient's device was interrogated.  The information was reviewed. Programming changes were made as below   PVCs   PVCs are about  60% less than last year;  VT-NS increased

## 2018-04-03 LAB — CUP PACEART INCLINIC DEVICE CHECK
Battery Remaining Longevity: 36 mo
Battery Voltage: 2.95 V
Brady Statistic AS VS Percent: 1.11 %
Brady Statistic RV Percent Paced: 90.96 %
Date Time Interrogation Session: 20200109222400
HIGH POWER IMPEDANCE MEASURED VALUE: 51 Ohm
HIGH POWER IMPEDANCE MEASURED VALUE: 75 Ohm
Implantable Lead Implant Date: 20010823
Implantable Lead Implant Date: 20010823
Implantable Lead Location: 753858
Implantable Lead Location: 753860
Implantable Lead Model: 6940
Implantable Pulse Generator Implant Date: 20150921
Lead Channel Impedance Value: 304 Ohm
Lead Channel Impedance Value: 437 Ohm
Lead Channel Impedance Value: 437 Ohm
Lead Channel Pacing Threshold Amplitude: 0.625 V
Lead Channel Pacing Threshold Amplitude: 0.75 V
Lead Channel Pacing Threshold Amplitude: 2.5 V
Lead Channel Pacing Threshold Pulse Width: 0.4 ms
Lead Channel Pacing Threshold Pulse Width: 0.4 ms
Lead Channel Sensing Intrinsic Amplitude: 1.25 mV
Lead Channel Sensing Intrinsic Amplitude: 4.5 mV
Lead Channel Setting Pacing Amplitude: 0.5 V
Lead Channel Setting Pacing Amplitude: 2.5 V
Lead Channel Setting Pacing Pulse Width: 0.03 ms
Lead Channel Setting Pacing Pulse Width: 0.4 ms
MDC IDC LEAD IMPLANT DT: 20091119
MDC IDC LEAD LOCATION: 753859
MDC IDC LEAD SERIAL: 110961
MDC IDC MSMT LEADCHNL LV IMPEDANCE VALUE: 1045 Ohm
MDC IDC MSMT LEADCHNL LV IMPEDANCE VALUE: 532 Ohm
MDC IDC MSMT LEADCHNL LV IMPEDANCE VALUE: 703 Ohm
MDC IDC MSMT LEADCHNL RA PACING THRESHOLD PULSEWIDTH: 0.4 ms
MDC IDC MSMT LEADCHNL RA SENSING INTR AMPL: 1.375 mV
MDC IDC MSMT LEADCHNL RV SENSING INTR AMPL: 4.875 mV
MDC IDC SET LEADCHNL LV PACING AMPLITUDE: 1.75 V
MDC IDC SET LEADCHNL RV SENSING SENSITIVITY: 0.3 mV
MDC IDC STAT BRADY AP VP PERCENT: 36.08 %
MDC IDC STAT BRADY AP VS PERCENT: 0.02 %
MDC IDC STAT BRADY AS VP PERCENT: 62.79 %
MDC IDC STAT BRADY RA PERCENT PACED: 33.1 %

## 2018-04-07 LAB — CUP PACEART REMOTE DEVICE CHECK
Brady Statistic AP VP Percent: 35.37 %
Brady Statistic AP VS Percent: 0.01 %
Brady Statistic AS VP Percent: 64.11 %
Brady Statistic AS VS Percent: 0.51 %
Brady Statistic RA Percent Paced: 32.79 %
Brady Statistic RV Percent Paced: 92.59 %
Date Time Interrogation Session: 20191118083627
HighPow Impedance: 46 Ohm
HighPow Impedance: 65 Ohm
Implantable Lead Implant Date: 20010823
Implantable Lead Implant Date: 20091119
Implantable Lead Location: 753860
Implantable Lead Model: 6940
Lead Channel Impedance Value: 266 Ohm
Lead Channel Impedance Value: 399 Ohm
Lead Channel Impedance Value: 589 Ohm
Lead Channel Impedance Value: 627 Ohm
Lead Channel Pacing Threshold Amplitude: 0.625 V
Lead Channel Pacing Threshold Pulse Width: 0.4 ms
Lead Channel Sensing Intrinsic Amplitude: 1.375 mV
Lead Channel Sensing Intrinsic Amplitude: 1.375 mV
Lead Channel Sensing Intrinsic Amplitude: 3.75 mV
Lead Channel Sensing Intrinsic Amplitude: 3.75 mV
Lead Channel Setting Pacing Amplitude: 0.5 V
Lead Channel Setting Pacing Amplitude: 1.75 V
Lead Channel Setting Pacing Amplitude: 2.5 V
Lead Channel Setting Pacing Pulse Width: 0.03 ms
Lead Channel Setting Pacing Pulse Width: 0.4 ms
MDC IDC LEAD IMPLANT DT: 20010823
MDC IDC LEAD LOCATION: 753858
MDC IDC LEAD LOCATION: 753859
MDC IDC LEAD SERIAL: 110961
MDC IDC MSMT BATTERY REMAINING LONGEVITY: 41 mo
MDC IDC MSMT BATTERY VOLTAGE: 2.94 V
MDC IDC MSMT LEADCHNL LV IMPEDANCE VALUE: 1045 Ohm
MDC IDC MSMT LEADCHNL LV PACING THRESHOLD PULSEWIDTH: 0.4 ms
MDC IDC MSMT LEADCHNL RA IMPEDANCE VALUE: 399 Ohm
MDC IDC MSMT LEADCHNL RA PACING THRESHOLD AMPLITUDE: 0.75 V
MDC IDC MSMT LEADCHNL RV PACING THRESHOLD AMPLITUDE: 2.5 V
MDC IDC MSMT LEADCHNL RV PACING THRESHOLD PULSEWIDTH: 0.4 ms
MDC IDC PG IMPLANT DT: 20150921
MDC IDC SET LEADCHNL RV SENSING SENSITIVITY: 0.3 mV

## 2018-04-09 DIAGNOSIS — Z85828 Personal history of other malignant neoplasm of skin: Secondary | ICD-10-CM | POA: Diagnosis not present

## 2018-04-09 DIAGNOSIS — Z08 Encounter for follow-up examination after completed treatment for malignant neoplasm: Secondary | ICD-10-CM | POA: Diagnosis not present

## 2018-04-22 ENCOUNTER — Other Ambulatory Visit: Payer: Self-pay | Admitting: Internal Medicine

## 2018-05-04 ENCOUNTER — Ambulatory Visit (INDEPENDENT_AMBULATORY_CARE_PROVIDER_SITE_OTHER): Payer: Medicare Other

## 2018-05-04 DIAGNOSIS — Z95810 Presence of automatic (implantable) cardiac defibrillator: Secondary | ICD-10-CM

## 2018-05-04 DIAGNOSIS — I5022 Chronic systolic (congestive) heart failure: Secondary | ICD-10-CM | POA: Diagnosis not present

## 2018-05-04 NOTE — Progress Notes (Signed)
EPIC Encounter for ICM Monitoring  Patient Name: TEREN ZURCHER is a 85 y.o. male Date: 05/04/2018 Primary Care Physican: Dettinger, Fransisca Kaufmann, MD Primary Cardiologist: Caryl Comes Electrophysiologist: Vergie Living Pacing: 92.8% Last Weight:145lbs Today's Weight:140 lbs  Spoke with wife.Heart Failure questions reviewed, pt asymptomatic.  Thoracic impedance normal.  Prescribed:Furosemide 40 mg 1 tablet daily  Labs: 04/04/2017 Creatinine 1.40, BUN 15, Potassium 4.8, Sodium 139, EGFR 46-53  Recommendations:Encouraged to call for any fluid symptoms.   Follow-up plan: ICM clinic phone appointment on3/16/2020.  Copy of ICM check sent to [place].   3 month ICM trend: 05/04/2018    1 Year ICM trend:       [name], RN 05/04/2018 10:34 AM

## 2018-05-11 ENCOUNTER — Ambulatory Visit (INDEPENDENT_AMBULATORY_CARE_PROVIDER_SITE_OTHER): Payer: Medicare Other

## 2018-05-11 DIAGNOSIS — I5022 Chronic systolic (congestive) heart failure: Secondary | ICD-10-CM

## 2018-05-11 DIAGNOSIS — I255 Ischemic cardiomyopathy: Secondary | ICD-10-CM

## 2018-05-11 LAB — CUP PACEART REMOTE DEVICE CHECK
Battery Remaining Longevity: 33 mo
Battery Voltage: 2.92 V
Brady Statistic AP VP Percent: 33.45 %
Brady Statistic AP VS Percent: 0.02 %
Brady Statistic AS VP Percent: 65.78 %
Brady Statistic AS VS Percent: 0.75 %
Brady Statistic RA Percent Paced: 31.2 %
Date Time Interrogation Session: 20200217051706
HIGH POWER IMPEDANCE MEASURED VALUE: 69 Ohm
HighPow Impedance: 47 Ohm
Implantable Lead Implant Date: 20010823
Implantable Lead Implant Date: 20010823
Implantable Lead Implant Date: 20091119
Implantable Lead Location: 753858
Implantable Lead Location: 753860
Implantable Lead Model: 148
Implantable Lead Model: 6940
Implantable Lead Serial Number: 110961
Implantable Pulse Generator Implant Date: 20150921
Lead Channel Impedance Value: 266 Ohm
Lead Channel Impedance Value: 380 Ohm
Lead Channel Impedance Value: 399 Ohm
Lead Channel Impedance Value: 494 Ohm
Lead Channel Impedance Value: 646 Ohm
Lead Channel Impedance Value: 988 Ohm
Lead Channel Pacing Threshold Amplitude: 0.75 V
Lead Channel Pacing Threshold Amplitude: 2.5 V
Lead Channel Pacing Threshold Pulse Width: 0.4 ms
Lead Channel Pacing Threshold Pulse Width: 0.4 ms
Lead Channel Pacing Threshold Pulse Width: 0.4 ms
Lead Channel Sensing Intrinsic Amplitude: 1.5 mV
Lead Channel Sensing Intrinsic Amplitude: 5 mV
Lead Channel Setting Pacing Amplitude: 0.5 V
Lead Channel Setting Pacing Amplitude: 2.5 V
Lead Channel Setting Pacing Pulse Width: 0.03 ms
Lead Channel Setting Pacing Pulse Width: 0.4 ms
Lead Channel Setting Sensing Sensitivity: 0.3 mV
MDC IDC LEAD LOCATION: 753859
MDC IDC MSMT LEADCHNL LV PACING THRESHOLD AMPLITUDE: 0.625 V
MDC IDC MSMT LEADCHNL RA SENSING INTR AMPL: 1.5 mV
MDC IDC MSMT LEADCHNL RV SENSING INTR AMPL: 5 mV
MDC IDC SET LEADCHNL LV PACING AMPLITUDE: 1.75 V
MDC IDC STAT BRADY RV PERCENT PACED: 92.86 %

## 2018-05-20 NOTE — Progress Notes (Signed)
Remote ICD transmission.   

## 2018-06-08 ENCOUNTER — Ambulatory Visit (INDEPENDENT_AMBULATORY_CARE_PROVIDER_SITE_OTHER): Payer: Medicare Other

## 2018-06-08 DIAGNOSIS — I5022 Chronic systolic (congestive) heart failure: Secondary | ICD-10-CM

## 2018-06-08 DIAGNOSIS — Z95810 Presence of automatic (implantable) cardiac defibrillator: Secondary | ICD-10-CM | POA: Diagnosis not present

## 2018-06-09 NOTE — Progress Notes (Signed)
EPIC Encounter for ICM Monitoring  Patient Name: [name] [name] is a 85 y.o. male Date: 06/09/2018 Primary Care Physican: Dettinger, Fransisca Kaufmann, MD Primary Cardiologist: Caryl Comes Electrophysiologist: Vergie Living Pacing: 92.7% Last Weight:140lbs Today's Weight:unknown  Attempted call to wife and unable to reach.   Transmission reviewed.   Thoracic impedance normal.  Prescribed:Furosemide 40 mg 1 tablet daily  Labs: 04/04/2017 Creatinine 1.40, BUN 15, Potassium 4.8, Sodium 139, EGFR 46-53  Recommendations:Unable to reach.  Follow-up plan: ICM clinic phone appointment on4/20/2020.  Copy of ICM check sent to [place].   3 month ICM trend: 06/08/2018    1 Year ICM trend:       [name], RN 06/09/2018 3:54 PM

## 2018-06-12 IMAGING — DX DG CHEST 2V
2 series · 2 of 2 positions shown · non-contrast
Comparison: 10/03/2009

CLINICAL DATA: Biventricular ICD (implantable
cardioverter-defibrillator) in place This device has started
"beeping" around 9 AM for the last 3 mornings. (was implanted approx
1 year ago.) No chest pain or SOB

EXAM:
CHEST  2 VIEW

[chest pa]
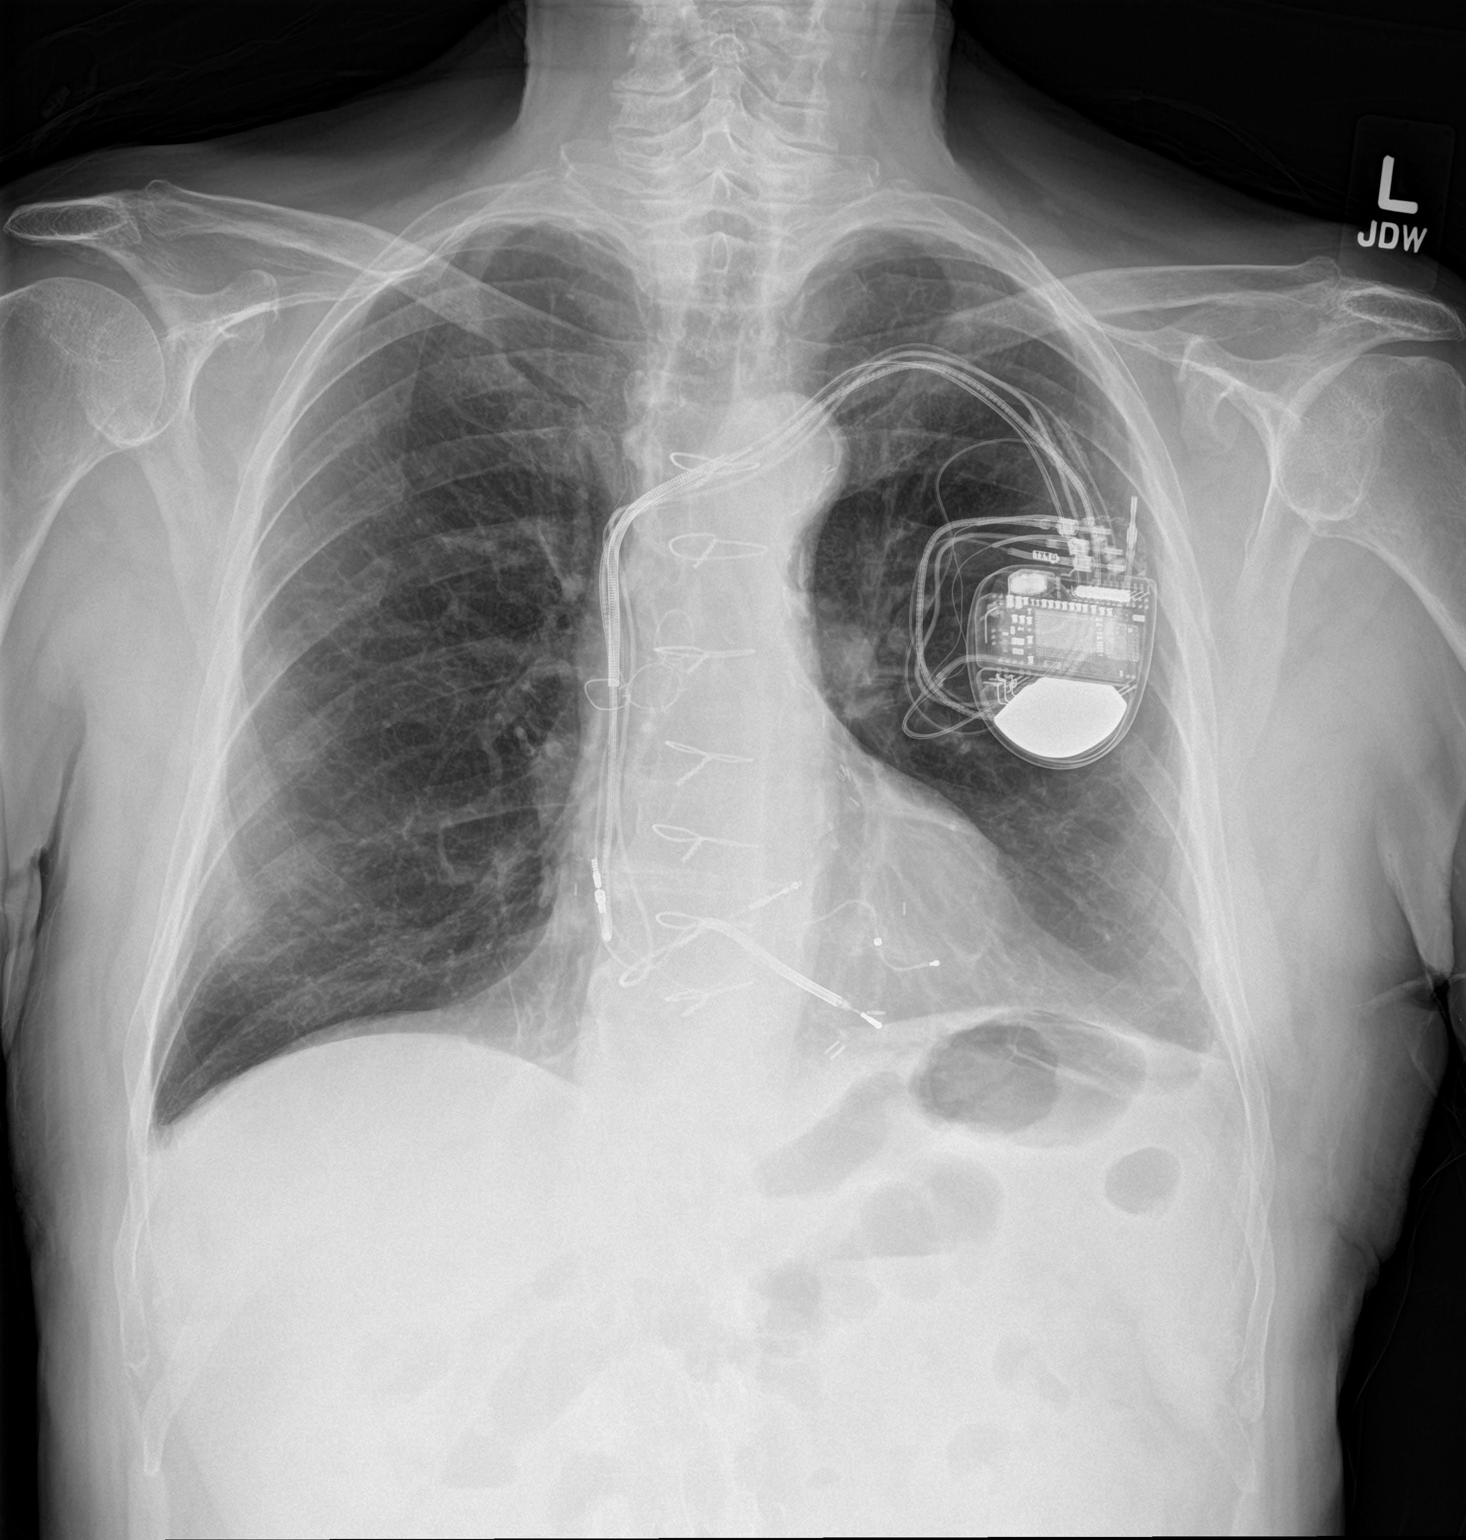

[chest lat]
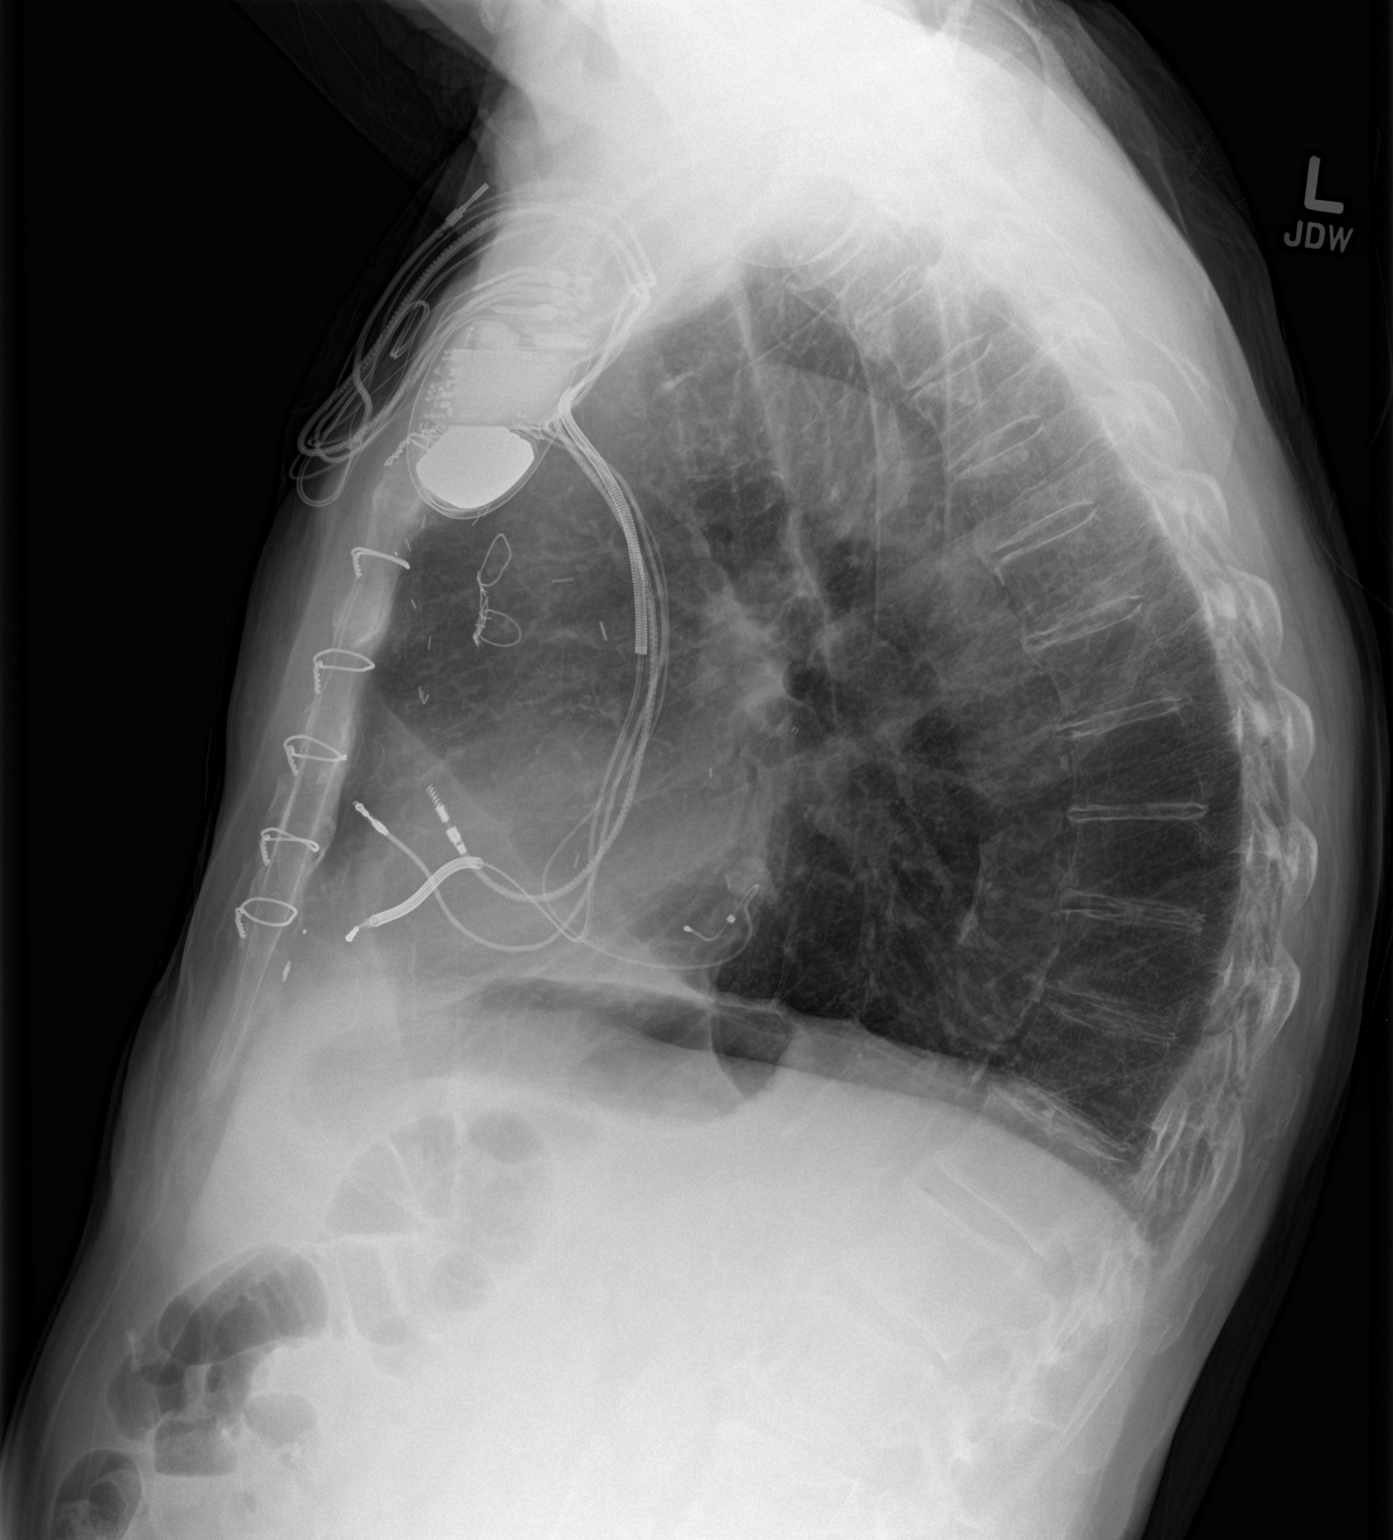

[2 of 2 positions shown; findings below may reference images not displayed]

FINDINGS: Status post median sternotomy and CABG. Heart size is normal.
Left-sided AICD with multiple leads appear unchanged. Orphan lead
again noted, which appears stable.

The lungs are clear. No pulmonary edema or consolidation. Patient is
kyphotic.
IMPRESSION: No evidence for acute cardiopulmonary abnormality.

## 2018-07-13 ENCOUNTER — Other Ambulatory Visit: Payer: Self-pay

## 2018-07-13 ENCOUNTER — Ambulatory Visit (INDEPENDENT_AMBULATORY_CARE_PROVIDER_SITE_OTHER): Payer: Medicare Other

## 2018-07-13 DIAGNOSIS — I5022 Chronic systolic (congestive) heart failure: Secondary | ICD-10-CM

## 2018-07-13 DIAGNOSIS — Z95810 Presence of automatic (implantable) cardiac defibrillator: Secondary | ICD-10-CM | POA: Diagnosis not present

## 2018-07-13 NOTE — Progress Notes (Signed)
EPIC Encounter for ICM Monitoring  Patient Name: [name] [name] is a 85 y.o. male Date: 07/13/2018 Primary Care Physican: Dettinger, Fransisca Kaufmann, MD Primary Cardiologist: Caryl Comes Electrophysiologist: Vergie Living Pacing: 92.4% Last Weight:140lbs   Spoke with wife.  She said patient is feeling fine and denies any fluid symptoms. Family is getting groceries for them.    Thoracic impedance normal.  Prescribed:Furosemide 40 mg 1 tablet daily  Labs: 04/04/2017 Creatinine 1.40, BUN 15, Potassium 4.8, Sodium 139, EGFR 46-53  Recommendations: No changes and encouraged to call for fluid symptoms.   Follow-up plan: ICM clinic phone appointment on5/26/2020.  Copy of ICM check sent to [place].  3 month ICM trend: 07/13/2018    1 Year ICM trend:       [name], RN 07/13/2018 2:56 PM

## 2018-08-10 ENCOUNTER — Ambulatory Visit (INDEPENDENT_AMBULATORY_CARE_PROVIDER_SITE_OTHER): Payer: Medicare Other | Admitting: *Deleted

## 2018-08-10 ENCOUNTER — Other Ambulatory Visit: Payer: Self-pay

## 2018-08-10 DIAGNOSIS — I255 Ischemic cardiomyopathy: Secondary | ICD-10-CM | POA: Diagnosis not present

## 2018-08-11 LAB — CUP PACEART REMOTE DEVICE CHECK
Battery Remaining Longevity: 34 mo
Battery Voltage: 2.92 V
Brady Statistic AP VP Percent: 27.59 %
Brady Statistic AP VS Percent: 0.02 %
Brady Statistic AS VP Percent: 71.47 %
Brady Statistic AS VS Percent: 0.93 %
Brady Statistic RA Percent Paced: 25.55 %
Brady Statistic RV Percent Paced: 91.93 %
Date Time Interrogation Session: 20200518062706
HighPow Impedance: 51 Ohm
HighPow Impedance: 74 Ohm
Implantable Lead Implant Date: 20010823
Implantable Lead Implant Date: 20010823
Implantable Lead Implant Date: 20091119
Implantable Lead Location: 753858
Implantable Lead Location: 753859
Implantable Lead Location: 753860
Implantable Lead Model: 148
Implantable Lead Model: 6940
Implantable Lead Serial Number: 110961
Implantable Pulse Generator Implant Date: 20150921
Lead Channel Impedance Value: 1045 Ohm
Lead Channel Impedance Value: 304 Ohm
Lead Channel Impedance Value: 437 Ohm
Lead Channel Impedance Value: 437 Ohm
Lead Channel Impedance Value: 532 Ohm
Lead Channel Impedance Value: 665 Ohm
Lead Channel Pacing Threshold Amplitude: 0.625 V
Lead Channel Pacing Threshold Amplitude: 0.75 V
Lead Channel Pacing Threshold Amplitude: 2.5 V
Lead Channel Pacing Threshold Pulse Width: 0.4 ms
Lead Channel Pacing Threshold Pulse Width: 0.4 ms
Lead Channel Pacing Threshold Pulse Width: 0.4 ms
Lead Channel Sensing Intrinsic Amplitude: 1.5 mV
Lead Channel Sensing Intrinsic Amplitude: 1.5 mV
Lead Channel Sensing Intrinsic Amplitude: 2.875 mV
Lead Channel Sensing Intrinsic Amplitude: 2.875 mV
Lead Channel Setting Pacing Amplitude: 0.5 V
Lead Channel Setting Pacing Amplitude: 1.75 V
Lead Channel Setting Pacing Amplitude: 2.5 V
Lead Channel Setting Pacing Pulse Width: 0.03 ms
Lead Channel Setting Pacing Pulse Width: 0.4 ms
Lead Channel Setting Sensing Sensitivity: 0.3 mV

## 2018-08-18 ENCOUNTER — Ambulatory Visit (INDEPENDENT_AMBULATORY_CARE_PROVIDER_SITE_OTHER): Payer: Medicare Other

## 2018-08-18 ENCOUNTER — Encounter: Payer: Self-pay | Admitting: Cardiology

## 2018-08-18 DIAGNOSIS — Z95810 Presence of automatic (implantable) cardiac defibrillator: Secondary | ICD-10-CM | POA: Diagnosis not present

## 2018-08-18 DIAGNOSIS — I5022 Chronic systolic (congestive) heart failure: Secondary | ICD-10-CM

## 2018-08-18 NOTE — Progress Notes (Signed)
Remote ICD transmission.   

## 2018-08-19 NOTE — Progress Notes (Signed)
EPIC Encounter for ICM Monitoring  Patient Name: [name] [name] is a 85 y.o. male Date: 08/19/2018 Primary Care Physican: Dettinger, Fransisca Kaufmann, MD Primary Cardiologist: Caryl Comes Electrophysiologist: Vergie Living Pacing: 93.1% 08/19/2018 Weight:140lbs   Spoke withwife. She said patient is feeling fine and denies any fluid symptoms.  Family is getting groceries for them.    Optivol Thoracic impedance normal.  Prescribed:Furosemide 40 mg 1 tablet daily  Labs: 04/04/2017 Creatinine 1.40, BUN 15, Potassium 4.8, Sodium 139, EGFR 46-53  Recommendations: No changes and encouraged to call for fluid symptoms.   Follow-up plan: ICM clinic phone appointment on6/29/2020.  Copy of ICM check sent to [place].   3 month ICM trend: 08/18/2018    1 Year ICM trend:       [name], RN 08/19/2018 9:50 AM

## 2018-09-21 ENCOUNTER — Ambulatory Visit (INDEPENDENT_AMBULATORY_CARE_PROVIDER_SITE_OTHER): Payer: Medicare Other

## 2018-09-21 DIAGNOSIS — Z95810 Presence of automatic (implantable) cardiac defibrillator: Secondary | ICD-10-CM

## 2018-09-21 DIAGNOSIS — I5022 Chronic systolic (congestive) heart failure: Secondary | ICD-10-CM

## 2018-09-23 NOTE — Progress Notes (Signed)
EPIC Encounter for ICM Monitoring  Patient Name: [name] [name] is a 85 y.o. male Date: 09/23/2018 Primary Care Physican: Dettinger, Fransisca Kaufmann, MD Primary Cardiologist: Caryl Comes Electrophysiologist: Vergie Living Pacing: 94% 08/19/2018 Weight:140lbs   Transmission reviewed.  Optivol Thoracic impedance normal.  Prescribed:Furosemide 40 mg 1 tablet daily  Labs: 04/04/2017 Creatinine 1.40, BUN 15, Potassium 4.8, Sodium 139, EGFR 46-53  Recommendations: No changes.  Follow-up plan: ICM clinic phone appointment on8/05/2018.  Copy of ICM check sent to [place].   3 month ICM trend: 09/21/2018    1 Year ICM trend:       [name], RN 09/23/2018 10:50 AM

## 2018-10-26 ENCOUNTER — Ambulatory Visit (INDEPENDENT_AMBULATORY_CARE_PROVIDER_SITE_OTHER): Payer: Medicare Other

## 2018-10-26 DIAGNOSIS — Z95810 Presence of automatic (implantable) cardiac defibrillator: Secondary | ICD-10-CM | POA: Diagnosis not present

## 2018-10-26 DIAGNOSIS — I5022 Chronic systolic (congestive) heart failure: Secondary | ICD-10-CM | POA: Diagnosis not present

## 2018-10-27 NOTE — Progress Notes (Signed)
EPIC Encounter for ICM Monitoring  Patient Name: [name] [name] is a 85 y.o. male Date: 10/27/2018 Primary Care Physican: Dettinger, Fransisca Kaufmann, MD Primary Cardiologist: Caryl Comes Electrophysiologist: Vergie Living Pacing: 95.2% 10/27/2018 Weight: 143 lbs   Spoke with patient.  He is feeling really good and denied any fluid symptoms.  OptivolThoracic impedance normal.  Prescribed:Furosemide 40 mg 1 tablet daily  Labs: 04/04/2017 Creatinine 1.40, BUN 15, Potassium 4.8, Sodium 139, EGFR 46-53  Recommendations: No changes.  Follow-up plan: ICM clinic phone appointment on9/05/2018.  Copy of ICM check sent to [place].  3 month ICM trend: 10/26/2018    1 Year ICM trend:       [name], RN 10/27/2018 10:53 AM

## 2018-11-03 ENCOUNTER — Other Ambulatory Visit: Payer: Self-pay

## 2018-11-09 ENCOUNTER — Ambulatory Visit (INDEPENDENT_AMBULATORY_CARE_PROVIDER_SITE_OTHER): Payer: Medicare Other | Admitting: *Deleted

## 2018-11-09 DIAGNOSIS — I255 Ischemic cardiomyopathy: Secondary | ICD-10-CM

## 2018-11-09 LAB — CUP PACEART REMOTE DEVICE CHECK
Battery Remaining Longevity: 31 mo
Battery Voltage: 2.91 V
Brady Statistic AP VP Percent: 31.04 %
Brady Statistic AP VS Percent: 0.03 %
Brady Statistic AS VP Percent: 68.43 %
Brady Statistic AS VS Percent: 0.51 %
Brady Statistic RA Percent Paced: 29.45 %
Brady Statistic RV Percent Paced: 94.5 %
Date Time Interrogation Session: 20200817073427
HighPow Impedance: 46 Ohm
HighPow Impedance: 63 Ohm
Implantable Lead Implant Date: 20010823
Implantable Lead Implant Date: 20010823
Implantable Lead Implant Date: 20091119
Implantable Lead Location: 753858
Implantable Lead Location: 753859
Implantable Lead Location: 753860
Implantable Lead Model: 148
Implantable Lead Model: 6940
Implantable Lead Serial Number: 110961
Implantable Pulse Generator Implant Date: 20150921
Lead Channel Impedance Value: 1026 Ohm
Lead Channel Impedance Value: 247 Ohm
Lead Channel Impedance Value: 380 Ohm
Lead Channel Impedance Value: 380 Ohm
Lead Channel Impedance Value: 513 Ohm
Lead Channel Impedance Value: 646 Ohm
Lead Channel Pacing Threshold Amplitude: 0.75 V
Lead Channel Pacing Threshold Amplitude: 0.875 V
Lead Channel Pacing Threshold Amplitude: 2.5 V
Lead Channel Pacing Threshold Pulse Width: 0.4 ms
Lead Channel Pacing Threshold Pulse Width: 0.4 ms
Lead Channel Pacing Threshold Pulse Width: 0.4 ms
Lead Channel Sensing Intrinsic Amplitude: 1.5 mV
Lead Channel Sensing Intrinsic Amplitude: 1.5 mV
Lead Channel Sensing Intrinsic Amplitude: 3.375 mV
Lead Channel Sensing Intrinsic Amplitude: 3.375 mV
Lead Channel Setting Pacing Amplitude: 0.5 V
Lead Channel Setting Pacing Amplitude: 2 V
Lead Channel Setting Pacing Amplitude: 2.5 V
Lead Channel Setting Pacing Pulse Width: 0.03 ms
Lead Channel Setting Pacing Pulse Width: 0.4 ms
Lead Channel Setting Sensing Sensitivity: 0.3 mV

## 2018-11-17 ENCOUNTER — Encounter: Payer: Self-pay | Admitting: Cardiology

## 2018-11-17 NOTE — Progress Notes (Signed)
Remote ICD transmission.   

## 2018-11-25 ENCOUNTER — Other Ambulatory Visit: Payer: Self-pay

## 2018-11-26 ENCOUNTER — Ambulatory Visit (INDEPENDENT_AMBULATORY_CARE_PROVIDER_SITE_OTHER): Payer: Medicare Other

## 2018-11-26 ENCOUNTER — Ambulatory Visit (INDEPENDENT_AMBULATORY_CARE_PROVIDER_SITE_OTHER): Payer: Medicare Other | Admitting: *Deleted

## 2018-11-26 DIAGNOSIS — Z95810 Presence of automatic (implantable) cardiac defibrillator: Secondary | ICD-10-CM | POA: Diagnosis not present

## 2018-11-26 DIAGNOSIS — I5022 Chronic systolic (congestive) heart failure: Secondary | ICD-10-CM | POA: Diagnosis not present

## 2018-11-26 DIAGNOSIS — Z23 Encounter for immunization: Secondary | ICD-10-CM

## 2018-11-27 NOTE — Progress Notes (Signed)
EPIC Encounter for ICM Monitoring  Patient Name: [name] [name] is a 85 y.o. male Date: 11/27/2018 Primary Care Physican: Dettinger, Fransisca Kaufmann, MD Primary Cardiologist: Caryl Comes Electrophysiologist: Vergie Living Pacing: 93.5% 10/27/2018 Weight: 143 lbs   Transmission reviewed.  OptivolThoracic impedance normal.  Prescribed:Furosemide 40 mg 1 tablet daily  Labs: 04/04/2017 Creatinine 1.40, BUN 15, Potassium 4.8, Sodium 139, EGFR 46-53  Recommendations: None  Follow-up plan: ICM clinic phone appointment on 01/06/2019.   91 day device clinic remote transmission 02/08/2019.     Copy of ICM check sent to Dr. Caryl Comes.   3 month ICM trend: 11/26/2018    1 Year ICM trend:       [name], RN 11/27/2018 4:23 PM

## 2018-12-10 ENCOUNTER — Other Ambulatory Visit: Payer: Self-pay

## 2018-12-11 ENCOUNTER — Encounter: Payer: Self-pay | Admitting: Family Medicine

## 2018-12-11 ENCOUNTER — Ambulatory Visit (INDEPENDENT_AMBULATORY_CARE_PROVIDER_SITE_OTHER): Payer: Medicare Other | Admitting: Family Medicine

## 2018-12-11 VITALS — BP 138/67 | HR 71 | Temp 97.3°F | Resp 16 | Ht 67.0 in | Wt 137.0 lb

## 2018-12-11 DIAGNOSIS — I10 Essential (primary) hypertension: Secondary | ICD-10-CM | POA: Diagnosis not present

## 2018-12-11 DIAGNOSIS — E785 Hyperlipidemia, unspecified: Secondary | ICD-10-CM

## 2018-12-11 DIAGNOSIS — J449 Chronic obstructive pulmonary disease, unspecified: Secondary | ICD-10-CM | POA: Diagnosis not present

## 2018-12-11 MED ORDER — FUROSEMIDE 40 MG PO TABS: 40.0000 mg | ORAL_TABLET | Freq: Every day | ORAL | 3 refills | Status: DC

## 2018-12-11 MED ORDER — SPIRONOLACTONE 25 MG PO TABS: 12.5000 mg | ORAL_TABLET | Freq: Every day | ORAL | 3 refills | Status: DC

## 2018-12-11 NOTE — Progress Notes (Signed)
BP 138/67   Pulse 71   Temp (!) 97.3 F (36.3 C) (Temporal)   Resp 16   Ht _0  (1.702 m)   Wt 137 lb (62.1 kg)   SpO2 97%   BMI 21.46 kg/m    Subjective:   Patient ID: [name] [name], male    DOB: 1930/12/12, 85 y.o.   MRN: [id number]  HPI: [name] [name] is a 85 y.o. male presenting on 12/11/2018 for Medical Management of Chronic Issues (due for lab work)   HPI Hypertension and CHF Patient is currently on aspirin and Lasix and spironolactone, and their blood pressure today is 138/67. Patient denies any lightheadedness or dizziness. Patient denies headaches, blurred vision, chest pains, shortness of breath, or weakness. Denies any side effects from medication and is content with current medication.   COPD Patient is coming in for COPD recheck today.  He is currently on no medication and denies any major issues with his breathing.  He has a mild chronic cough but denies any major coughing spells or wheezing spells.  He has 0nighttime symptoms per week and 0daytime symptoms per week currently.   Hyperlipidemia Patient is coming in for recheck of his hyperlipidemia. The patient is currently taking fish oils and ginseng. They deny any issues with myalgias or history of liver damage from it. They deny any focal numbness or weakness or chest pain.   Relevant past medical, surgical, family and social history reviewed and updated as indicated. Interim medical history since our last visit reviewed. Allergies and medications reviewed and updated.  Review of Systems  Constitutional: Negative for chills and fever.  Eyes: Negative for discharge.  Respiratory: Negative for shortness of breath and wheezing.   Cardiovascular: Negative for chest pain and leg swelling.  Musculoskeletal: Negative for back pain and gait problem.  Skin: Negative for rash.  Neurological: Negative for dizziness, weakness, light-headedness and numbness.  All other systems reviewed and are negative.   Per HPI  unless specifically indicated above   Allergies as of 12/11/2018      Reactions   Pollen Extract    Ramipril Hives, Swelling   Crestor [rosuvastatin] Anxiety      Medication List       Accurate as of December 11, 2018 10:58 AM. If you have any questions, ask your nurse or doctor.        aspirin 81 MG tablet Take 81 mg by mouth daily.   Fish Oil 1000 MG Cpdr Take 1 capsule by mouth daily.   furosemide 40 MG tablet Commonly known as: LASIX Take 1 tablet (40 mg total) by mouth daily.   Ginseng 250 MG Caps Take 1 capsule by mouth daily.   multivitamin with minerals tablet Take 1 tablet by mouth daily.   nitroGLYCERIN 0.4 MG SL tablet Commonly known as: NITROSTAT Place 1 tablet (0.4 mg total) under the tongue every 5 (five) minutes as needed for chest pain.   spironolactone 25 MG tablet Commonly known as: ALDACTONE Take 0.5 tablets (12.5 mg total) by mouth daily.        Objective:   BP 138/67   Pulse 71   Temp (!) 97.3 F (36.3 C) (Temporal)   Resp 16   Ht _1  (1.702 m)   Wt 137 lb (62.1 kg)   SpO2 97%   BMI 21.46 kg/m   Wt Readings from Last 3 Encounters:  12/11/18 137 lb (62.1 kg)  04/02/18 140 lb 3.2 oz (63.6 kg)  10/29/17 140  lb (63.5 kg)    Physical Exam Vitals signs and nursing note reviewed.  Constitutional:      General: He is not in acute distress.    Appearance: He is well-developed. He is not diaphoretic.  Eyes:     General: No scleral icterus.    Conjunctiva/sclera: Conjunctivae normal.  Neck:     Musculoskeletal: Neck supple.     Thyroid: No thyromegaly.  Cardiovascular:     Rate and Rhythm: Normal rate and regular rhythm.     Heart sounds: Normal heart sounds. No murmur.  Pulmonary:     Effort: Pulmonary effort is normal. No respiratory distress.     Breath sounds: Normal breath sounds. No wheezing.  Musculoskeletal: Normal range of motion.  Lymphadenopathy:     Cervical: No cervical adenopathy.  Skin:    General: Skin is  warm and dry.     Findings: No rash.  Neurological:     Mental Status: He is alert and oriented to person, place, and time.     Coordination: Coordination normal.  Psychiatric:        Behavior: Behavior normal.       Assessment & Plan:   Problem List Items Addressed This Visit      Cardiovascular and Mediastinum   Essential hypertension   Relevant Medications   furosemide (LASIX) 40 MG tablet   spironolactone (ALDACTONE) 25 MG tablet   Other Relevant Orders   CMP14+EGFR     Respiratory   COPD (chronic obstructive pulmonary disease) (HCC)   Relevant Orders   CBC with Differential/Platelet   CMP14+EGFR     Other   Hyperlipidemia LDL goal <100 - Primary   Relevant Medications   furosemide (LASIX) 40 MG tablet   spironolactone (ALDACTONE) 25 MG tablet   Other Relevant Orders   Lipid panel      Continue current medication and blood pressure and will check blood work today, looks good and he will follow-up with cardiologist as well. Follow up plan: Return in about 4 months (around 04/12/2019), or if symptoms worsen or fail to improve, for Hypertension.  Counseling provided for all of the vaccine components Orders Placed This Encounter  Procedures  . CBC with Differential/Platelet  . CMP14+EGFR  . Lipid panel    Caryl Pina, MD [facility] 12/11/2018, 10:58 AM

## 2018-12-12 LAB — CBC WITH DIFFERENTIAL/PLATELET
Basophils Absolute: 0.1 10*3/uL (ref 0.0–0.2)
Basos: 1 %
EOS (ABSOLUTE): 0.2 10*3/uL (ref 0.0–0.4)
Eos: 2 %
Hematocrit: 48.1 % (ref 37.5–51.0)
Hemoglobin: 16.1 g/dL (ref 13.0–17.7)
Immature Grans (Abs): 0 10*3/uL (ref 0.0–0.1)
Immature Granulocytes: 0 %
Lymphocytes Absolute: 2.6 10*3/uL (ref 0.7–3.1)
Lymphs: 28 %
MCH: 30.3 pg (ref 26.6–33.0)
MCHC: 33.5 g/dL (ref 31.5–35.7)
MCV: 91 fL (ref 79–97)
Monocytes Absolute: 0.6 10*3/uL (ref 0.1–0.9)
Monocytes: 6 %
Neutrophils Absolute: 5.6 10*3/uL (ref 1.4–7.0)
Neutrophils: 63 %
Platelets: 185 10*3/uL (ref 150–450)
RBC: 5.31 x10E6/uL (ref 4.14–5.80)
RDW: 12.9 % (ref 11.6–15.4)
WBC: 9 10*3/uL (ref 3.4–10.8)

## 2018-12-12 LAB — LIPID PANEL
Chol/HDL Ratio: 6.6 ratio — ABNORMAL HIGH (ref 0.0–5.0)
Cholesterol, Total: 290 mg/dL — ABNORMAL HIGH (ref 100–199)
HDL: 44 mg/dL (ref >39)
LDL Chol Calc (NIH): 212 mg/dL — ABNORMAL HIGH (ref 0–99)
Triglycerides: 176 mg/dL — ABNORMAL HIGH (ref 0–149)
VLDL Cholesterol Cal: 34 mg/dL (ref 5–40)

## 2018-12-12 LAB — CMP14+EGFR
ALT: 9 IU/L (ref 0–44)
AST: 12 IU/L (ref 0–40)
Albumin/Globulin Ratio: 2.1 (ref 1.2–2.2)
Albumin: 4.6 g/dL (ref 3.6–4.6)
Alkaline Phosphatase: 52 IU/L (ref 39–117)
BUN/Creatinine Ratio: 10 (ref 10–24)
BUN: 14 mg/dL (ref 8–27)
Bilirubin Total: 0.6 mg/dL (ref 0.0–1.2)
CO2: 26 mmol/L (ref 20–29)
Calcium: 10.2 mg/dL (ref 8.6–10.2)
Chloride: 102 mmol/L (ref 96–106)
Creatinine, Ser: 1.37 mg/dL — ABNORMAL HIGH (ref 0.76–1.27)
GFR calc Af Amer: 54 mL/min/{1.73_m2} — ABNORMAL LOW (ref >59)
GFR calc non Af Amer: 46 mL/min/{1.73_m2} — ABNORMAL LOW (ref >59)
Globulin, Total: 2.2 g/dL (ref 1.5–4.5)
Glucose: 110 mg/dL — ABNORMAL HIGH (ref 65–99)
Potassium: 4.4 mmol/L (ref 3.5–5.2)
Sodium: 140 mmol/L (ref 134–144)
Total Protein: 6.8 g/dL (ref 6.0–8.5)

## 2019-01-06 ENCOUNTER — Ambulatory Visit (INDEPENDENT_AMBULATORY_CARE_PROVIDER_SITE_OTHER): Payer: Medicare Other

## 2019-01-06 DIAGNOSIS — Z95810 Presence of automatic (implantable) cardiac defibrillator: Secondary | ICD-10-CM

## 2019-01-06 DIAGNOSIS — I5022 Chronic systolic (congestive) heart failure: Secondary | ICD-10-CM | POA: Diagnosis not present

## 2019-01-08 NOTE — Progress Notes (Signed)
EPIC Encounter for ICM Monitoring  Patient Name: ZYKEEM IFFT is a 85 y.o. male Date: 01/08/2019 Primary Care Physican: Dettinger, Fransisca Kaufmann, MD Primary Cardiologist: Caryl Comes Electrophysiologist: Vergie Living Pacing: 93.5% 12/11/2018 Weight: 137 lbs  Since 26-Nov-2018  Time in AT/AF  <0.1 hr/day (<0.1%) VT-NS (>4 beats, >162 bpm)  7     Transmission reviewed.  OptivolThoracic impedance normal.  Prescribed:Furosemide 40 mg 1 tablet daily  Labs: 12/11/2018 Creatinine 1.37, BUN 14, Potassium 4.4, Sodium 140, GFR 46-54  Recommendations: None  Follow-up plan: ICM clinic phone appointment on 02/09/2019/2020.   91 day device clinic remote transmission 02/08/2019.     Copy of ICM check sent to Dr. Caryl Comes.   3 month ICM trend: 01/06/2019    1 Year ICM trend:       [name], RN 01/08/2019 4:20 PM

## 2019-02-08 ENCOUNTER — Ambulatory Visit (INDEPENDENT_AMBULATORY_CARE_PROVIDER_SITE_OTHER): Payer: Medicare Other | Admitting: *Deleted

## 2019-02-08 DIAGNOSIS — I5022 Chronic systolic (congestive) heart failure: Secondary | ICD-10-CM

## 2019-02-08 DIAGNOSIS — I255 Ischemic cardiomyopathy: Secondary | ICD-10-CM

## 2019-02-09 ENCOUNTER — Ambulatory Visit (INDEPENDENT_AMBULATORY_CARE_PROVIDER_SITE_OTHER): Payer: Medicare Other

## 2019-02-09 DIAGNOSIS — I5022 Chronic systolic (congestive) heart failure: Secondary | ICD-10-CM | POA: Diagnosis not present

## 2019-02-09 DIAGNOSIS — Z95810 Presence of automatic (implantable) cardiac defibrillator: Secondary | ICD-10-CM

## 2019-02-09 LAB — CUP PACEART REMOTE DEVICE CHECK
Battery Remaining Longevity: 30 mo
Battery Voltage: 2.9 V
Brady Statistic AP VP Percent: 28.32 %
Brady Statistic AP VS Percent: 0.02 %
Brady Statistic AS VP Percent: 71.08 %
Brady Statistic AS VS Percent: 0.58 %
Brady Statistic RA Percent Paced: 27.01 %
Brady Statistic RV Percent Paced: 95.09 %
Date Time Interrogation Session: 20201116072726
HighPow Impedance: 48 Ohm
HighPow Impedance: 68 Ohm
Implantable Lead Implant Date: 20010823
Implantable Lead Implant Date: 20010823
Implantable Lead Implant Date: 20091119
Implantable Lead Location: 753858
Implantable Lead Location: 753859
Implantable Lead Location: 753860
Implantable Lead Model: 148
Implantable Lead Model: 6940
Implantable Lead Serial Number: 110961
Implantable Pulse Generator Implant Date: 20150921
Lead Channel Impedance Value: 1083 Ohm
Lead Channel Impedance Value: 304 Ohm
Lead Channel Impedance Value: 399 Ohm
Lead Channel Impedance Value: 437 Ohm
Lead Channel Impedance Value: 532 Ohm
Lead Channel Impedance Value: 665 Ohm
Lead Channel Pacing Threshold Amplitude: 0.75 V
Lead Channel Pacing Threshold Amplitude: 0.875 V
Lead Channel Pacing Threshold Amplitude: 2.5 V
Lead Channel Pacing Threshold Pulse Width: 0.4 ms
Lead Channel Pacing Threshold Pulse Width: 0.4 ms
Lead Channel Pacing Threshold Pulse Width: 0.4 ms
Lead Channel Sensing Intrinsic Amplitude: 1.375 mV
Lead Channel Sensing Intrinsic Amplitude: 1.375 mV
Lead Channel Sensing Intrinsic Amplitude: 3.25 mV
Lead Channel Sensing Intrinsic Amplitude: 3.25 mV
Lead Channel Setting Pacing Amplitude: 0.5 V
Lead Channel Setting Pacing Amplitude: 2 V
Lead Channel Setting Pacing Amplitude: 2.5 V
Lead Channel Setting Pacing Pulse Width: 0.03 ms
Lead Channel Setting Pacing Pulse Width: 0.4 ms
Lead Channel Setting Sensing Sensitivity: 0.3 mV

## 2019-02-12 NOTE — Progress Notes (Signed)
EPIC Encounter for ICM Monitoring  Patient Name: [name] [name] is a 85 y.o. male Date: 02/12/2019 Primary Care Physican: Dettinger, Fransisca Kaufmann, MD Primary Cardiologist: Caryl Comes Electrophysiologist: Vergie Living Pacing: 95.1% 12/11/2018 Weight: 137 lbs   Transmission reviewed.  OptivolThoracic impedance normal.  Prescribed:Furosemide 40 mg 1 tablet daily  Labs: 12/11/2018 Creatinine 1.37, BUN 14, Potassium 4.4, Sodium 140, GFR 46-54  Recommendations: None  Follow-up plan: ICM clinic phone appointment on 03/22/2019.   91 day device clinic remote transmission 05/10/2019.    Copy of ICM check sent to Dr. Caryl Comes.   3 month ICM trend: 02/08/2019    1 Year ICM trend:       [name], RN 02/12/2019 10:49 AM

## 2019-03-05 NOTE — Progress Notes (Signed)
Remote ICD transmission.   

## 2019-03-22 ENCOUNTER — Ambulatory Visit (INDEPENDENT_AMBULATORY_CARE_PROVIDER_SITE_OTHER): Payer: Medicare Other

## 2019-03-22 DIAGNOSIS — I5022 Chronic systolic (congestive) heart failure: Secondary | ICD-10-CM | POA: Diagnosis not present

## 2019-03-22 DIAGNOSIS — Z95810 Presence of automatic (implantable) cardiac defibrillator: Secondary | ICD-10-CM

## 2019-03-22 NOTE — Progress Notes (Signed)
EPIC Encounter for ICM Monitoring  Patient Name: [name] [name] is a 85 y.o. male Date: 03/22/2019 Primary Care Physican: Dettinger, Fransisca Kaufmann, MD Primary Cardiologist: Caryl Comes Electrophysiologist: Vergie Living Pacing: 94.3% 03/22/2019 Weight: 143lbs  VT-NS (>4 beats, >162 bpm)  7 episodes   Spoke with wife, [name] and patient is asymptomatic for fluid accumulation.  OptivolThoracic impedance normal.  Prescribed:Furosemide 40 mg 1 tablet daily  Labs: 12/11/2018 Creatinine 1.37, BUN 14, Potassium 4.4, Sodium 140, GFR 46-54  Recommendations:  None  Follow-up plan: ICM clinic phone appointment on 04/26/2019.   91 day device clinic remote transmission 05/10/2019.    Copy of ICM check sent to Dr. Caryl Comes.    3 month ICM trend: 03/22/2019    1 Year ICM trend:       [name], RN 03/22/2019 3:21 PM

## 2019-03-24 ENCOUNTER — Telehealth: Payer: Self-pay | Admitting: Family Medicine

## 2019-03-24 DIAGNOSIS — L989 Disorder of the skin and subcutaneous tissue, unspecified: Secondary | ICD-10-CM | POA: Diagnosis not present

## 2019-03-24 DIAGNOSIS — L03113 Cellulitis of right upper limb: Secondary | ICD-10-CM | POA: Diagnosis not present

## 2019-03-24 NOTE — Telephone Encounter (Signed)
Patient wanted appt ASAP advised to go to urgent care we did not have nothing until Monday

## 2019-04-01 DIAGNOSIS — Z85828 Personal history of other malignant neoplasm of skin: Secondary | ICD-10-CM | POA: Diagnosis not present

## 2019-04-01 DIAGNOSIS — D225 Melanocytic nevi of trunk: Secondary | ICD-10-CM | POA: Diagnosis not present

## 2019-04-01 DIAGNOSIS — C44622 Squamous cell carcinoma of skin of right upper limb, including shoulder: Secondary | ICD-10-CM | POA: Diagnosis not present

## 2019-04-01 DIAGNOSIS — Z08 Encounter for follow-up examination after completed treatment for malignant neoplasm: Secondary | ICD-10-CM | POA: Diagnosis not present

## 2019-04-01 DIAGNOSIS — L821 Other seborrheic keratosis: Secondary | ICD-10-CM | POA: Diagnosis not present

## 2019-04-09 ENCOUNTER — Other Ambulatory Visit: Payer: Self-pay

## 2019-04-12 ENCOUNTER — Other Ambulatory Visit: Payer: Self-pay

## 2019-04-12 ENCOUNTER — Ambulatory Visit (INDEPENDENT_AMBULATORY_CARE_PROVIDER_SITE_OTHER): Payer: Medicare Other | Admitting: Family Medicine

## 2019-04-12 ENCOUNTER — Encounter: Payer: Self-pay | Admitting: Family Medicine

## 2019-04-12 VITALS — BP 136/78 | HR 82 | Temp 98.0°F | Ht 67.0 in | Wt 138.4 lb

## 2019-04-12 DIAGNOSIS — E785 Hyperlipidemia, unspecified: Secondary | ICD-10-CM | POA: Diagnosis not present

## 2019-04-12 DIAGNOSIS — J449 Chronic obstructive pulmonary disease, unspecified: Secondary | ICD-10-CM

## 2019-04-12 DIAGNOSIS — I10 Essential (primary) hypertension: Secondary | ICD-10-CM

## 2019-04-12 MED ORDER — NITROGLYCERIN 0.4 MG SL SUBL: 0.4000 mg | SUBLINGUAL_TABLET | SUBLINGUAL | 6 refills | Status: DC | PRN

## 2019-04-12 NOTE — Progress Notes (Signed)
BP 136/78   Pulse 82   Temp 98 F (36.7 C) (Temporal)   Ht 5\' 7"  (1.702 m)   Wt 138 lb 6.4 oz (62.8 kg)   SpO2 98%   BMI 21.68 kg/m    Subjective:   Patient ID: [name] [name], male    DOB: [birth year], 86 y.o.   MRN: [id number]  HPI: [name] [name] is a 86 y.o. male presenting on 04/12/2019 for Hyperlipidemia (4 month follow up) and Hypertension   HPI Hypertension Patient is currently on spironolactone and furosemide, and their blood pressure today is 136/78. Patient denies any lightheadedness or dizziness. Patient denies headaches, blurred vision, chest pains, shortness of breath, or weakness. Denies any side effects from medication and is content with current medication.   Hyperlipidemia Patient is coming in for recheck of his hyperlipidemia. The patient is currently taking omega-3. They deny any issues with myalgias or history of liver damage from it. They deny any focal numbness or weakness or chest pain.   COPD Patient is coming in for COPD recheck today.  He is currently on no medication.  He has a mild chronic cough but denies any major coughing spells or wheezing spells.  He has 0nighttime symptoms per week and 0daytime symptoms per week currently.   Relevant past medical, surgical, family and social history reviewed and updated as indicated. Interim medical history since our last visit reviewed. Allergies and medications reviewed and updated.  Review of Systems  Constitutional: Negative for chills and fever.  Eyes: Negative for visual disturbance.  Respiratory: Negative for cough, shortness of breath and wheezing.   Cardiovascular: Negative for chest pain, palpitations and leg swelling.  Gastrointestinal: Negative for abdominal pain, blood in stool, constipation and diarrhea.  Genitourinary: Negative for dysuria and hematuria.  Musculoskeletal: Negative for back pain, gait problem and myalgias.  Skin: Negative for rash.  Neurological: Negative for dizziness, weakness  and headaches.  Psychiatric/Behavioral: Negative for suicidal ideas.  All other systems reviewed and are negative.   Per HPI unless specifically indicated above   Allergies as of 04/12/2019      Reactions   Bee Pollen    Pollen Extract    Ramipril Hives, Swelling   Crestor [rosuvastatin] Anxiety      Medication List       Accurate as of April 12, 2019 11:39 AM. If you have any questions, ask your nurse or doctor.        aspirin 81 MG tablet Take 81 mg by mouth daily.   Fish Oil 1000 MG Cpdr Take 1 capsule by mouth daily.   furosemide 40 MG tablet Commonly known as: LASIX Take 1 tablet (40 mg total) by mouth daily.   Ginseng 250 MG Caps Take 1 capsule by mouth daily.   multivitamin with minerals tablet Take 1 tablet by mouth daily.   nitroGLYCERIN 0.4 MG SL tablet Commonly known as: NITROSTAT Place 1 tablet (0.4 mg total) under the tongue every 5 (five) minutes as needed for chest pain.   spironolactone 25 MG tablet Commonly known as: ALDACTONE Take 0.5 tablets (12.5 mg total) by mouth daily.        Objective:   BP 136/78   Pulse 82   Temp 98 F (36.7 C) (Temporal)   Ht 5\' 7"  (1.702 m)   Wt 138 lb 6.4 oz (62.8 kg)   SpO2 98%   BMI 21.68 kg/m   Wt Readings from Last 3 Encounters:  04/12/19 138 lb 6.4 oz (62.8  kg)  12/11/18 137 lb (62.1 kg)  04/02/18 140 lb 3.2 oz (63.6 kg)    Physical Exam Vitals and nursing note reviewed.  Constitutional:      General: He is not in acute distress.    Appearance: He is well-developed. He is not diaphoretic.  Eyes:     General: No scleral icterus.    Conjunctiva/sclera: Conjunctivae normal.  Neck:     Thyroid: No thyromegaly.  Cardiovascular:     Rate and Rhythm: Normal rate and regular rhythm.     Heart sounds: Normal heart sounds. No murmur.  Pulmonary:     Effort: Pulmonary effort is normal. No respiratory distress.     Breath sounds: Normal breath sounds. No wheezing.  Musculoskeletal:         General: No swelling. Normal range of motion.     Cervical back: Neck supple.  Lymphadenopathy:     Cervical: No cervical adenopathy.  Skin:    General: Skin is warm and dry.     Findings: No rash.  Neurological:     Mental Status: He is alert and oriented to person, place, and time.     Coordination: Coordination normal.  Psychiatric:        Behavior: Behavior normal.       Assessment & Plan:   Problem List Items Addressed This Visit      Cardiovascular and Mediastinum   Essential hypertension - Primary     Respiratory   COPD (chronic obstructive pulmonary disease) (HCC)     Other   Hyperlipidemia LDL goal <100      No change in current medication, will check blood work but he is doing pretty good for an 86 year old gentleman.  Patient's nitro was outdated so he just wants a new one, he has not used it in some time but just likes to keep it on hand just in case. Follow up plan: Return in about 4 months (around 08/10/2019), or if symptoms worsen or fail to improve, for Hypertension and cholesterol.  Counseling provided for all of the vaccine components No orders of the defined types were placed in this encounter.   Caryl Pina, MD [facility] 04/12/2019, 11:39 AM

## 2019-04-22 DIAGNOSIS — Z08 Encounter for follow-up examination after completed treatment for malignant neoplasm: Secondary | ICD-10-CM | POA: Diagnosis not present

## 2019-04-22 DIAGNOSIS — Z85828 Personal history of other malignant neoplasm of skin: Secondary | ICD-10-CM | POA: Diagnosis not present

## 2019-04-22 DIAGNOSIS — C44519 Basal cell carcinoma of skin of other part of trunk: Secondary | ICD-10-CM | POA: Diagnosis not present

## 2019-04-26 ENCOUNTER — Ambulatory Visit (INDEPENDENT_AMBULATORY_CARE_PROVIDER_SITE_OTHER): Payer: Medicare Other

## 2019-04-26 DIAGNOSIS — I5022 Chronic systolic (congestive) heart failure: Secondary | ICD-10-CM

## 2019-04-26 DIAGNOSIS — Z95810 Presence of automatic (implantable) cardiac defibrillator: Secondary | ICD-10-CM | POA: Diagnosis not present

## 2019-04-27 ENCOUNTER — Telehealth: Payer: Self-pay

## 2019-04-27 NOTE — Telephone Encounter (Signed)
ICM call to patient and he denies any fluid symptoms.  He takes Furosemide daily as prescribed. He is eating foods that are not low in salt and encouraged to limit salt intake to 2000 mg daily.   OptivolThoracic impedance suggesting possible ongoing fluid accumulation since 04/07/19.  Prescribed:Furosemide 40 mg 1 tablet daily  Labs: 12/11/2018 Creatinine 1.37, BUN 14, Potassium 4.4, Sodium 140, GFR 46-54  Advised will send to Dr Caryl Comes for review and if any recommendations will call back.  Recheck fluid levels on 04/30/2019.   3 month ICM trend: 04/26/2019     1 Year ICM trend:

## 2019-04-27 NOTE — Progress Notes (Signed)
Spoke with patient.  He reports feeling fine and denies fluid symptoms.  Transmission reviewed.  He takes Furosemide daily as prescribed.  Discussed limiting salt intake and he he says he may be eating foods that are not low in salt.  Encouraged to limit salt intake to 2000 mg daily.  Advised will send to Dr Caryl Comes for review and if any recommendations will call back.  Recheck fluid levels on 04/30/2019.

## 2019-04-27 NOTE — Telephone Encounter (Signed)
L  why dont we try 80 for 3 days Thanks SK

## 2019-04-27 NOTE — Progress Notes (Signed)
EPIC Encounter for ICM Monitoring  Patient Name: [name] [name] is a 86 y.o. male Date: 04/27/2019 Primary Care Physican: Dettinger, Fransisca Kaufmann, MD Primary Cardiologist: Caryl Comes Electrophysiologist: Vergie Living Pacing: 93% 03/22/2019 Weight: 143lbs  Since 22-Mar-2019 VT-NS (>4 beats, >162 bpm)    6 episodes   Attempted call to wife, [name] and left message for return call.  Transmission reviewed.  OptivolThoracic impedance suggesting possible ongoing fluid accumulation since 04/07/19.  Prescribed:Furosemide 40 mg 1 tablet daily  Labs: 12/11/2018 Creatinine 1.37, BUN 14, Potassium 4.4, Sodium 140, GFR 46-54  Recommendations: Unable to reach.    Follow-up plan: ICM clinic phone appointment on2/15/2021 to recheck fluid levels. 91 day device clinic remote transmission 05/10/2019.  Office appt 06/16/2019 with Dr. Caryl Comes.    Copy of ICM check sent to Dr. Caryl Comes.   3 month ICM trend: 04/26/2019    1 Year ICM trend:       [name], RN 04/27/2019 12:42 PM

## 2019-04-28 NOTE — Telephone Encounter (Signed)
Call to patient.  Advised Dr Caryl Comes recommended to take Furosemide 2 tablets = 80 mg x 3 days and then returned to 1 tablet 40 mg daily after the 3rd day.  He repeated instructions back correctly.  He took today's Furosemide dose within the last 30 minutes and told him to take the extra tablet now for the 80 mg dosage for today.  He agreed.  Will recheck fluid levels on Friday, 2/5.

## 2019-04-28 NOTE — Progress Notes (Addendum)
Call to patient. Advised Dr Caryl Comes recommended to take Furosemide 2 tablets = 80 mg x 3 days and then returned to 1 tablet 40 mg daily after the 3rd day (see ICM phone note for recommendations). He repeated instructions back correctly. He took today's Furosemide dose within the last 30 minutes and told him to take the extra tablet now for the 80 mg dosage for today. He agreed. Will recheck fluid levels on Friday, 2/5.         [name], MD  Physician  Specialty: Cardiology      Signed        L why dont we try 80 for 3 days  Thanks SK

## 2019-04-30 ENCOUNTER — Ambulatory Visit (INDEPENDENT_AMBULATORY_CARE_PROVIDER_SITE_OTHER): Payer: Medicare Other

## 2019-04-30 DIAGNOSIS — I5022 Chronic systolic (congestive) heart failure: Secondary | ICD-10-CM

## 2019-04-30 DIAGNOSIS — Z95810 Presence of automatic (implantable) cardiac defibrillator: Secondary | ICD-10-CM

## 2019-04-30 NOTE — Progress Notes (Signed)
EPIC Encounter for ICM Monitoring  Patient Name: [name] [name] is a 86 y.o. male Date: 04/30/2019 Primary Care Physican: Dettinger, Fransisca Kaufmann, MD Primary Cardiologist: Caryl Comes Electrophysiologist: Vergie Living Pacing: 93% 03/22/2019 Weight: 143lbs  Since 26-Apr-2019  VT-NS (>4 beats, >162 bpm) 2 episodes   Spoke with patient and he is feeling fine.  Torsemide 80 mg increased urination and returns to prescribed dosage of 40 mg on 2/6.  OptivolThoracic impedance returned close to baseline after taking 2 days of Furosemide 80 mg daily and today will be 3rd day of the 80 mg dosage (report sent prior to taking the 80 mg dosage today).  Prescribed:Furosemide 40 mg 1 tablet daily  Labs: 12/11/2018 Creatinine 1.37, BUN 14, Potassium 4.4, Sodium 140, GFR 46-54  Recommendations: Reinforced limiting salt intake to < 2000 mg daily and fluid intake to 64 oz daily.  Encouraged to call if experiencing fluid symptoms.  Follow-up plan: ICM clinic phone appointment on 05/31/2019.   91 day device clinic remote transmission 05/10/2019.  Office appt 06/16/2019 with Dr. Caryl Comes.    Copy of ICM check sent to Dr. Caryl Comes.   3 month ICM trend: 04/30/2019    1 Year ICM trend:       [name], RN 04/30/2019 1:15 PM

## 2019-05-10 ENCOUNTER — Ambulatory Visit (INDEPENDENT_AMBULATORY_CARE_PROVIDER_SITE_OTHER): Payer: Medicare Other | Admitting: *Deleted

## 2019-05-10 DIAGNOSIS — I5022 Chronic systolic (congestive) heart failure: Secondary | ICD-10-CM | POA: Diagnosis not present

## 2019-05-10 LAB — CUP PACEART REMOTE DEVICE CHECK
Battery Remaining Longevity: 29 mo
Battery Voltage: 2.88 V
Brady Statistic AP VP Percent: 46.28 %
Brady Statistic AP VS Percent: 0.03 %
Brady Statistic AS VP Percent: 53.34 %
Brady Statistic AS VS Percent: 0.36 %
Brady Statistic RA Percent Paced: 42.69 %
Brady Statistic RV Percent Paced: 92.76 %
Date Time Interrogation Session: 20210215033427
HighPow Impedance: 46 Ohm
HighPow Impedance: 59 Ohm
Implantable Lead Implant Date: 20010823
Implantable Lead Implant Date: 20010823
Implantable Lead Implant Date: 20091119
Implantable Lead Location: 753858
Implantable Lead Location: 753859
Implantable Lead Location: 753860
Implantable Lead Model: 148
Implantable Lead Model: 6940
Implantable Lead Serial Number: 110961
Implantable Pulse Generator Implant Date: 20150921
Lead Channel Impedance Value: 247 Ohm
Lead Channel Impedance Value: 380 Ohm
Lead Channel Impedance Value: 380 Ohm
Lead Channel Impedance Value: 494 Ohm
Lead Channel Impedance Value: 627 Ohm
Lead Channel Impedance Value: 988 Ohm
Lead Channel Pacing Threshold Amplitude: 0.75 V
Lead Channel Pacing Threshold Amplitude: 0.75 V
Lead Channel Pacing Threshold Amplitude: 2.5 V
Lead Channel Pacing Threshold Pulse Width: 0.4 ms
Lead Channel Pacing Threshold Pulse Width: 0.4 ms
Lead Channel Pacing Threshold Pulse Width: 0.4 ms
Lead Channel Sensing Intrinsic Amplitude: 1.625 mV
Lead Channel Sensing Intrinsic Amplitude: 1.625 mV
Lead Channel Sensing Intrinsic Amplitude: 2.875 mV
Lead Channel Sensing Intrinsic Amplitude: 2.875 mV
Lead Channel Setting Pacing Amplitude: 0.5 V
Lead Channel Setting Pacing Amplitude: 1.75 V
Lead Channel Setting Pacing Amplitude: 2.5 V
Lead Channel Setting Pacing Pulse Width: 0.03 ms
Lead Channel Setting Pacing Pulse Width: 0.4 ms
Lead Channel Setting Sensing Sensitivity: 0.3 mV

## 2019-05-11 NOTE — Progress Notes (Signed)
ICD Remote  

## 2019-05-31 ENCOUNTER — Ambulatory Visit (INDEPENDENT_AMBULATORY_CARE_PROVIDER_SITE_OTHER): Payer: Medicare Other

## 2019-05-31 DIAGNOSIS — Z95810 Presence of automatic (implantable) cardiac defibrillator: Secondary | ICD-10-CM | POA: Diagnosis not present

## 2019-05-31 DIAGNOSIS — I5022 Chronic systolic (congestive) heart failure: Secondary | ICD-10-CM

## 2019-06-01 ENCOUNTER — Ambulatory Visit (INDEPENDENT_AMBULATORY_CARE_PROVIDER_SITE_OTHER): Payer: Medicare Other

## 2019-06-01 DIAGNOSIS — Z0000 Encounter for general adult medical examination without abnormal findings: Secondary | ICD-10-CM | POA: Diagnosis not present

## 2019-06-01 NOTE — Progress Notes (Addendum)
EPIC Encounter for ICM Monitoring  Patient Name: [name] [name] is a 86 y.o. male Date: 06/01/2019 Primary Care Physican: Dettinger, Fransisca Kaufmann, MD Primary Cardiologist: Caryl Comes Electrophysiologist: Vergie Living Pacing: 94.7% 06/01/2019 Weight: 139lb   Spoke with patient and he is feeling fine.    OptivolThoracic impedancenormal.  Prescribed:Furosemide 40 mg 1 tablet daily  Labs: 12/11/2018 Creatinine 1.37, BUN 14, Potassium 4.4, Sodium 140, GFR 46-54  Recommendations: No changes and encouraged to call if experiencing any fluid symptoms.  Follow-up plan: ICM clinic phone appointment on 07/05/2019.   91 day device clinic remote transmission 08/09/2019.  Office appt 06/16/2019 with Dr. Caryl Comes.    Copy of ICM check sent to Dr. Caryl Comes.   3 month ICM trend: 05/31/2019    1 Year ICM trend:       [name], RN 06/01/2019 1:39 PM

## 2019-06-01 NOTE — Progress Notes (Signed)
MEDICARE ANNUAL WELLNESS VISIT  06/01/2019  Telephone Visit Disclaimer This Medicare AWV was conducted by telephone due to national recommendations for restrictions regarding the COVID-19 Pandemic (e.g. social distancing).  I verified, using two identifiers, that I am speaking with [name] [name] or their authorized healthcare agent. I discussed the limitations, risks, security, and privacy concerns of performing an evaluation and management service by telephone and the potential availability of an in-person appointment in the future. The patient expressed understanding and agreed to proceed.   Subjective:  [name] [name] is a 86 y.o. male patient of Dettinger, Fransisca Kaufmann, MD who had a Medicare Annual Wellness Visit today via telephone. [name] [name] is Retired and lives with their spouse. he has 5 children. he reports that he is not socially active and does interact with friends/family regularly. he is minimally physically active and enjoys reading his bible/pray.  Patient Care Team: Dettinger, Fransisca Kaufmann, MD as PCP - General (Family Medicine)  Advanced Directives 10/29/2017 12/13/2013  Does Patient Have a Medical Advance Directive? No No  Would patient like information on creating a medical advance directive? Yes (MAU/Ambulatory/Procedural Areas - Information given) No - patient declined information    Hospital Utilization Over the Past 12 Months: # of hospitalizations or ER visits: 0 # of surgeries: 0  Review of Systems    Patient reports that his overall health is unchanged compared to last year.  Patient Reported Readings (BP-  Pain Assessment  No pain     Current Medications & Allergies (verified) Allergies as of 06/01/2019      Reactions   Bee Pollen    Pollen Extract    Ramipril Hives, Swelling   Crestor [rosuvastatin] Anxiety      Medication List       Accurate as of June 01, 2019 11:18 AM. If you have any questions, ask your nurse or doctor.        aspirin 81 MG  tablet Take 81 mg by mouth daily.   Fish Oil 1000 MG Cpdr Take 1 capsule by mouth daily.   furosemide 40 MG tablet Commonly known as: LASIX Take 1 tablet (40 mg total) by mouth daily.   Ginseng 250 MG Caps Take 1 capsule by mouth daily.   multivitamin with minerals tablet Take 1 tablet by mouth daily.   nitroGLYCERIN 0.4 MG SL tablet Commonly known as: NITROSTAT Place 1 tablet (0.4 mg total) under the tongue every 5 (five) minutes as needed for chest pain.   spironolactone 25 MG tablet Commonly known as: ALDACTONE Take 0.5 tablets (12.5 mg total) by mouth daily.       History (reviewed): Past Medical History:  Diagnosis Date  . CAD (coronary artery disease)    status post coronary bypass graft surgery in 1989 with documented occulsion of the vein graft to the diagonal branch of the left anterior descending in 2001.  Marland Kitchen COPD (chronic obstructive pulmonary disease) ([place])   . Hyperlipidemia   . Hypertension   . Ischemic cardiomyopathy    ejection fraction of 30% improved 35-40% 5/09  . Systolic congestive heart failure (HCC)    Class III systolic congestive heart, improved to class II, euvolemic.   Past Surgical History:  Procedure Laterality Date  . BI-VENTRICULAR IMPLANTABLE CARDIOVERTER DEFIBRILLATOR  (CRT-D)  2005; 2015   STJ CRTD implanted by Dr Caryl Comes  . BI-VENTRICULAR IMPLANTABLE CARDIOVERTER DEFIBRILLATOR UPGRADE N/A 12/13/2013   Procedure: BI-VENTRICULAR IMPLANTABLE CARDIOVERTER DEFIBRILLATOR UPGRADE;  Surgeon: [name], MD;  Location:  [facility] CATH LAB;  Service: Cardiovascular;  Laterality: N/A;  . CORONARY ARTERY BYPASS GRAFT  1989   Family History  Problem Relation Age of Onset  . Heart failure Mother    Social History   Socioeconomic History  . Marital status: Married    Spouse name: Not on file  . Number of children: 5  . Years of education: Not on file  . Highest education level: Not on file  Occupational History    Employer: RETIRED  Tobacco  Use  . Smoking status: Former Smoker    Packs/day: 1.00    Years: 43.00    Pack years: 43.00    Types: Cigarettes    Quit date: 12/07/1987    Years since quitting: 31.5  . Smokeless tobacco: Never Used  Substance and Sexual Activity  . Alcohol use: No  . Drug use: No  . Sexual activity: Not on file  Other Topics Concern  . Not on file  Social History Narrative  . Not on file   Social Determinants of Health   Financial Resource Strain:   . Difficulty of Paying Living Expenses: Not on file  Food Insecurity:   . Worried About Charity fundraiser in the Last Year: Not on file  . Ran Out of Food in the Last Year: Not on file  Transportation Needs:   . Lack of Transportation (Medical): Not on file  . Lack of Transportation (Non-Medical): Not on file  Physical Activity:   . Days of Exercise per Week: Not on file  . Minutes of Exercise per Session: Not on file  Stress:   . Feeling of Stress : Not on file  Social Connections:   . Frequency of Communication with Friends and Family: Not on file  . Frequency of Social Gatherings with Friends and Family: Not on file  . Attends Religious Services: Not on file  . Active Member of Clubs or Organizations: Not on file  . Attends Archivist Meetings: Not on file  . Marital Status: Not on file    Activities of Daily Living No flowsheet data found.  Patient Education/ Literacy    Exercise    Diet Patient reports consuming 3 meals a day and 2 snack(s) a day Patient reports that his primary diet is: Low Sodium Patient reports that she does have regular access to food.   Depression Screen PHQ 2/9 Scores 04/12/2019 12/11/2018 10/29/2017 04/04/2017 10/02/2016 09/28/2016 04/17/2015  PHQ - 2 Score 0 0 0 0 3 0 0  PHQ- 9 Score - - - - 9 - -     Fall Risk Fall Risk  04/12/2019 12/11/2018 10/29/2017 04/04/2017 10/02/2016  Falls in the past year? 0 0 No No No     Objective:  [name] [name] seemed alert and oriented and he participated  appropriately during our telephone visit.  Blood Pressure Weight BMI  BP Readings from Last 3 Encounters:  04/12/19 136/78  12/11/18 138/67  04/02/18 (!) 144/82   Wt Readings from Last 3 Encounters:  04/12/19 138 lb 6.4 oz (62.8 kg)  12/11/18 137 lb (62.1 kg)  04/02/18 140 lb 3.2 oz (63.6 kg)   BMI Readings from Last 1 Encounters:  04/12/19 21.68 kg/m    *Unable to obtain current vital signs, weight, and BMI due to telephone visit type  Hearing/Vision  . [name] [name] did  seem to have difficulty with hearing/understanding during the telephone conversation . Reports that he has had a formal eye exam by an eye  care professional within the past year . Reports that he has had a formal hearing evaluation within the past year *Unable to fully assess hearing and vision during telephone visit type  Cognitive Function: No flowsheet data found. (Normal:0-7, Significant for Dysfunction: >8)  Normal Cognitive Function Screening: Yes   Immunization & Health Maintenance Record Immunization History  Administered Date(s) Administered  . Fluad Quad(high Dose 65+) 11/26/2018  . Influenza Inj Mdck Quad Pf 11/26/2018  . Influenza, High Dose Seasonal PF 01/08/2017, 01/09/2018  . Influenza,inj,Quad PF,6+ Mos 12/26/2015    Health Maintenance  Topic Date Due  . TETANUS/TDAP  04/11/2020 (Originally 04/29/1949)  . PNA vac Low Risk Adult (1 of 2 - PCV13) 04/11/2020 (Originally 04/28/1997)  . INFLUENZA VACCINE  Completed       Assessment  This is a routine wellness examination for [name] [name].  Health Maintenance: Due or Overdue There are no preventive care reminders to display for this patient.  [name] [name] does not need a referral for Community Assistance: Care Management:   no Social Work:    no Prescription Assistance:  no Nutrition/Diabetes Education:  no   Plan:  Personalized Goals  Decrease fall risk  Increase water intake  Personalized Health Maintenance & Screening  Recommendations    Lung Cancer Screening Recommended: no (Low Dose CT Chest recommended if Age 24-80 years, 30 pack-year currently smoking OR have quit w/in past 15 years) Hepatitis C Screening recommended: no HIV Screening recommended: no  Advanced Directives: Written information was not prepared per patient's request.  Referrals & Orders No orders of the defined types were placed in this encounter.   Follow-up Plan . Follow-up with Dettinger, Fransisca Kaufmann, MD as planned . Schedule 08/11/2019    I have personally reviewed and noted the following in the patient's chart:   . Medical and social history . Use of alcohol, tobacco or illicit drugs  . Current medications and supplements . Functional ability and status . Nutritional status . Physical activity . Advanced directives . List of other physicians . Hospitalizations, surgeries, and ER visits in previous 12 months . Vitals . Screenings to include cognitive, depression, and falls . Referrals and appointments  In addition, I have reviewed and discussed with [name] [name] certain preventive protocols, quality metrics, and best practice recommendations. A written personalized care plan for preventive services as well as general preventive health recommendations is available and can be mailed to the patient at his request.      Alphonzo Dublin, LPN [id number]

## 2019-06-15 DIAGNOSIS — R002 Palpitations: Secondary | ICD-10-CM | POA: Insufficient documentation

## 2019-06-16 ENCOUNTER — Ambulatory Visit: Payer: Medicare Other | Admitting: Internal Medicine

## 2019-06-16 ENCOUNTER — Other Ambulatory Visit: Payer: Self-pay

## 2019-06-16 ENCOUNTER — Encounter: Payer: Self-pay | Admitting: Internal Medicine

## 2019-06-16 VITALS — BP 140/72 | HR 61 | Ht 67.0 in | Wt 140.0 lb

## 2019-06-16 DIAGNOSIS — I5022 Chronic systolic (congestive) heart failure: Secondary | ICD-10-CM | POA: Diagnosis not present

## 2019-06-16 DIAGNOSIS — Z95810 Presence of automatic (implantable) cardiac defibrillator: Secondary | ICD-10-CM | POA: Diagnosis not present

## 2019-06-16 DIAGNOSIS — I255 Ischemic cardiomyopathy: Secondary | ICD-10-CM | POA: Diagnosis not present

## 2019-06-16 DIAGNOSIS — R002 Palpitations: Secondary | ICD-10-CM

## 2019-06-16 LAB — CUP PACEART INCLINIC DEVICE CHECK
Brady Statistic AP VP Percent: 31.5 %
Brady Statistic AP VS Percent: 0.1 % — CL
Brady Statistic AS VP Percent: 67.8 %
Brady Statistic AS VS Percent: 0.7 %
Date Time Interrogation Session: 20210324153453
Implantable Lead Implant Date: 20010823
Implantable Lead Implant Date: 20010823
Implantable Lead Implant Date: 20091119
Implantable Lead Location: 753858
Implantable Lead Location: 753859
Implantable Lead Location: 753860
Implantable Lead Model: 148
Implantable Lead Model: 6940
Implantable Lead Serial Number: 110961
Implantable Pulse Generator Implant Date: 20150921
Lead Channel Pacing Threshold Amplitude: 0.5 V
Lead Channel Pacing Threshold Amplitude: 0.75 V
Lead Channel Pacing Threshold Pulse Width: 0.4 ms
Lead Channel Pacing Threshold Pulse Width: 0.8 ms
Lead Channel Sensing Intrinsic Amplitude: 1.4 mV
Lead Channel Sensing Intrinsic Amplitude: 4.9 mV
Lead Channel Setting Pacing Amplitude: 0.5 V
Lead Channel Setting Pacing Amplitude: 2 V
Lead Channel Setting Pacing Amplitude: 2.5 V
Lead Channel Setting Pacing Pulse Width: 0.03 ms
Lead Channel Setting Pacing Pulse Width: 0.4 ms
Lead Channel Setting Sensing Sensitivity: 0.3 mV

## 2019-06-16 MED ORDER — NITROGLYCERIN 0.4 MG SL SUBL: 0.4000 mg | SUBLINGUAL_TABLET | SUBLINGUAL | 6 refills | Status: DC | PRN

## 2019-06-16 NOTE — Progress Notes (Signed)
Patient Care Team: Dettinger, Fransisca Kaufmann, MD as PCP - General (Family Medicine)   HPI  [name] [name] is a 86 y.o. male seen in followup for Ischemic cardiomyopathy congestive heart failure for which he is s/p CRT-D implantation. He is s/p CABG and most recent EF was about 30-35% in fall of 2009. His device reached ERI 9/15 he underwent generator replacement at that time choosing to have CRT-D implanted    Device is programmed to subthreshold RV pacing output.  This dates back to December 2017.  Looking back at the notes they are mine and unfortunately incomplete.  There was a high RV pacing output of 3.5 V.  I presume it was done to allow for left ventricular only pacing.  This was done 9/17.  He has stable low amplitude R waves  He says his breathing is much better since his AV optimization echo; he is hoping that there is greater benefit to be gained.     He has struggled with fatigue. He has significant breathlessness. It is his and his wife's impression that is worsening. He's had this enormous PVC burden (15-20%) and has been in the past reluctant to undertake therapy for this.  Took amiodarone for a while but this has been discontinued.   He underwent catheterization in July of 2011. He had total occlusion of the vein graft to the right coronary artery and total occlusion of the vein graft to the diagonal branch. Ejection fraction was 40%.   Denies SOB or edema, infrequent chest pain.  Takes as needed nitroglycerin.   DATE TEST EF   7/11 LHC  40 % SVG-RCA-T; SVG-D1-T  9/17 Echo   40-45 %         Review therapy  Date Cr K TSH  3/15    4.9    7/18  1.85 4.7 2.62  1/19 1.4 4.8   9/20 1.37 4.4        Past Surgical History:  Procedure Laterality Date  . BI-VENTRICULAR IMPLANTABLE CARDIOVERTER DEFIBRILLATOR  (CRT-D)  2005; 2015   STJ CRTD implanted by Dr Caryl Comes  . BI-VENTRICULAR IMPLANTABLE CARDIOVERTER DEFIBRILLATOR UPGRADE N/A 12/13/2013   Procedure: BI-VENTRICULAR  IMPLANTABLE CARDIOVERTER DEFIBRILLATOR UPGRADE;  Surgeon: [name], MD;  Location: [facility] CATH LAB;  Service: Cardiovascular;  Laterality: N/A;  . CORONARY ARTERY BYPASS GRAFT  1989    Current Outpatient Medications  Medication Sig Dispense Refill  . aspirin 81 MG tablet Take 81 mg by mouth daily.     . furosemide (LASIX) 40 MG tablet Take 1 tablet (40 mg total) by mouth daily. 90 tablet 3  . Ginseng 250 MG CAPS Take 1 capsule by mouth daily.    . Multiple Vitamins-Minerals (MULTIVITAMIN WITH MINERALS) tablet Take 1 tablet by mouth daily.     . nitroGLYCERIN (NITROSTAT) 0.4 MG SL tablet Place 1 tablet (0.4 mg total) under the tongue every 5 (five) minutes as needed for chest pain. 25 tablet 6  . Omega-3 Fatty Acids (FISH OIL) 1000 MG CPDR Take 1 capsule by mouth daily.     Marland Kitchen spironolactone (ALDACTONE) 25 MG tablet Take 0.5 tablets (12.5 mg total) by mouth daily. 45 tablet 3   No current facility-administered medications for this visit.    Allergies  Allergen Reactions  . Bee Pollen   . Pollen Extract   . Ramipril Hives and Swelling  . Crestor [Rosuvastatin] Anxiety    Review of Systems negative except from HPI and PMH  Physical Exam BP 140/72   Pulse 61   Ht 5\' 7"  (1.702 m)   Wt 140 lb (63.5 kg)   SpO2 99%   BMI 21.93 kg/m  Well developed and well nourished in no acute distress HENT normal Neck supple with JVP-flat Clear Device pocket well healed; without hematoma or erythema.  There is no tethering  Regular rate and rhythm, no  murmur Abd-soft with active BS No Clubbing cyanosis   edema Skin-warm and dry A & Oriented  Grossly normal sensory and motor function  ECG sinus with P synchronous pacing upright QRS lead V1 and a upright QRS with an isoelectric early phase in lead I  Assessment and  Plan  Ischemic cardiomyopathy  Congestive heart failure-chronic-systolic  CRT-D- [employer] The patient's device was interrogated.  The information was reviewed. No changes  were made in the programming.     PVCs   Occasional chest discomfort.  Uses as needed nitroglycerin.  We will refill it.  Euvolemic continue current meds  PVC burden continues to decrease.  8.0--5.9%

## 2019-06-16 NOTE — Patient Instructions (Signed)
Medication Instructions:  Your physician recommends that you continue on your current medications as directed. Please refer to the Current Medication list given to you today.  Labwork: None ordered.  Testing/Procedures: None ordered.  Follow-Up: Your physician wants you to follow-up in: 6 months with Dr Klein. You will receive a reminder letter in the mail two months in advance. If you don't receive a letter, please call our office to schedule the follow-up appointment.  Remote monitoring is used to monitor your Pacemaker of ICD from home. This monitoring reduces the number of office visits required to check your device to one time per year. It allows us to keep an eye on the functioning of your device to ensure it is working properly.Any Other Special Instructions Will Be Listed Below (If Applicable).  If you need a refill on your cardiac medications before your next appointment, please call your pharmacy.   

## 2019-06-16 NOTE — Addendum Note (Signed)
Addended by: Thora Lance on: 06/16/2019 03:54 PM   Modules accepted: Orders

## 2019-06-18 NOTE — Addendum Note (Signed)
Addended by: De Burrs on: 06/18/2019 08:40 AM   Modules accepted: Orders

## 2019-07-05 ENCOUNTER — Ambulatory Visit (INDEPENDENT_AMBULATORY_CARE_PROVIDER_SITE_OTHER): Payer: Medicare Other

## 2019-07-05 DIAGNOSIS — I5022 Chronic systolic (congestive) heart failure: Secondary | ICD-10-CM | POA: Diagnosis not present

## 2019-07-05 DIAGNOSIS — Z95810 Presence of automatic (implantable) cardiac defibrillator: Secondary | ICD-10-CM

## 2019-07-05 NOTE — Progress Notes (Signed)
EPIC Encounter for ICM Monitoring  Patient Name: [name] [name] is a 86 y.o. male Date: 07/05/2019 Primary Care Physican: Dettinger, Fransisca Kaufmann, MD Primary Cardiologist: Caryl Comes Electrophysiologist: Vergie Living Pacing: 93.3% 07/05/2019 Weight: 139lb   Spoke with patient and reports feeling well at this time.  Denies fluid symptoms.    OptivolThoracic impedancenormal.  Prescribed:Furosemide 40 mg 1 tablet daily  Labs: 12/11/2018 Creatinine 1.37, BUN 14, Potassium 4.4, Sodium 140, GFR 46-54  Recommendations: No changes and encouraged to call if experiencing any fluid symptoms.  Follow-up plan: ICM clinic phone appointment on5/18/2021. 91 day device clinic remote transmission 08/09/2019.    Copy of ICM check sent to [place].   3 month ICM trend: 07/05/2019    1 Year ICM trend:       [name], RN 07/05/2019 11:50 AM

## 2019-08-09 ENCOUNTER — Ambulatory Visit (INDEPENDENT_AMBULATORY_CARE_PROVIDER_SITE_OTHER): Payer: Medicare Other | Admitting: *Deleted

## 2019-08-09 DIAGNOSIS — I255 Ischemic cardiomyopathy: Secondary | ICD-10-CM | POA: Diagnosis not present

## 2019-08-09 LAB — CUP PACEART REMOTE DEVICE CHECK
Battery Remaining Longevity: 26 mo
Battery Voltage: 2.87 V
Brady Statistic AP VP Percent: 38.5 %
Brady Statistic AP VS Percent: 0.03 %
Brady Statistic AS VP Percent: 61 %
Brady Statistic AS VS Percent: 0.47 %
Brady Statistic RA Percent Paced: 35.67 %
Brady Statistic RV Percent Paced: 92.84 %
Date Time Interrogation Session: 20210517001806
HighPow Impedance: 45 Ohm
HighPow Impedance: 64 Ohm
Implantable Lead Implant Date: 20010823
Implantable Lead Implant Date: 20010823
Implantable Lead Implant Date: 20091119
Implantable Lead Location: 753858
Implantable Lead Location: 753859
Implantable Lead Location: 753860
Implantable Lead Model: 148
Implantable Lead Model: 6940
Implantable Lead Serial Number: 110961
Implantable Pulse Generator Implant Date: 20150921
Lead Channel Impedance Value: 266 Ohm
Lead Channel Impedance Value: 399 Ohm
Lead Channel Impedance Value: 399 Ohm
Lead Channel Impedance Value: 513 Ohm
Lead Channel Impedance Value: 646 Ohm
Lead Channel Impedance Value: 969 Ohm
Lead Channel Pacing Threshold Amplitude: 0.75 V
Lead Channel Pacing Threshold Amplitude: 0.875 V
Lead Channel Pacing Threshold Amplitude: 2.5 V
Lead Channel Pacing Threshold Pulse Width: 0.4 ms
Lead Channel Pacing Threshold Pulse Width: 0.4 ms
Lead Channel Pacing Threshold Pulse Width: 0.4 ms
Lead Channel Sensing Intrinsic Amplitude: 1.375 mV
Lead Channel Sensing Intrinsic Amplitude: 1.375 mV
Lead Channel Sensing Intrinsic Amplitude: 3 mV
Lead Channel Sensing Intrinsic Amplitude: 3 mV
Lead Channel Setting Pacing Amplitude: 0.5 V
Lead Channel Setting Pacing Amplitude: 2 V
Lead Channel Setting Pacing Amplitude: 2.5 V
Lead Channel Setting Pacing Pulse Width: 0.03 ms
Lead Channel Setting Pacing Pulse Width: 0.4 ms
Lead Channel Setting Sensing Sensitivity: 0.3 mV

## 2019-08-09 NOTE — Progress Notes (Signed)
Remote ICD transmission.   

## 2019-08-10 ENCOUNTER — Ambulatory Visit (INDEPENDENT_AMBULATORY_CARE_PROVIDER_SITE_OTHER): Payer: Medicare Other

## 2019-08-10 ENCOUNTER — Telehealth: Payer: Self-pay

## 2019-08-10 DIAGNOSIS — I5022 Chronic systolic (congestive) heart failure: Secondary | ICD-10-CM | POA: Diagnosis not present

## 2019-08-10 DIAGNOSIS — Z95810 Presence of automatic (implantable) cardiac defibrillator: Secondary | ICD-10-CM

## 2019-08-10 NOTE — Telephone Encounter (Signed)
No Just follow  Thanks SK

## 2019-08-10 NOTE — Telephone Encounter (Signed)
Spoke with patient and reports feeling well at this time.  Denies fluid symptoms.     Discussed limiting salt and fluid intake. He reports he does not eat a lot of foods high in salt and does not eat restaurant foods. Wife cooks at home.  OptivolThoracic impedance suggesting possible fluid accumulation since 07/26/2019.  Confirmed he is taking Furosemide 40 mg 1 tablet daily  Labs: 12/11/2018 Creatinine 1.37, BUN 14, Potassium 4.4, Sodium 140, GFR 46-54  Recommendations:Advised to review foods labels for salt content and avoid foods high in salt  ICM next remote transmission scheduled 08/16/2019 to recheck fluid levels.    Copy sent to Dr.Klein for review and recommendations if needed.  3 month ICM trend: 08/09/2019    1 Year ICM trend:

## 2019-08-10 NOTE — Progress Notes (Signed)
EPIC Encounter for ICM Monitoring  Patient Name: [name] [name] is a 86 y.o. male Date: 08/10/2019 Primary Care Physican: Dettinger, Fransisca Kaufmann, MD Primary Cardiologist: Caryl Comes Electrophysiologist: Vergie Living Pacing: 92.8% 4/12/2021Weight: 140lb   VT-NS (>4 beats, >162 bpm)  10  Spoke with patient and reports feeling well at this time.  Denies fluid symptoms.   Discussed limiting salt and fluid intake. He reports he does not eat a lot of foods high in salt and does not eat restaurant foods. Wife cooks at home.  OptivolThoracic impedance suggesting possible fluid accumulation since 07/26/2019.  Prescribed:Furosemide 40 mg 1 tablet daily  Labs: 12/11/2018 Creatinine 1.37, BUN 14, Potassium 4.4, Sodium 140, GFR 46-54  Recommendations: No changes and encouraged to call if experiencing any fluid symptoms.  Follow-up plan: ICM clinic phone appointment on5/24/2021 to recheck fluid levels. 91 day device clinic remote transmission8/16/2021.    Copy of ICM check sent to [place] and phone note sent as well for recommendations.   3 month ICM trend: 08/09/2019    1 Year ICM trend:       [name], RN 08/10/2019 10:00 AM

## 2019-08-10 NOTE — Progress Notes (Signed)
[name], MD  Physician  Electrophysiology  Telephone Encounter  Signed  Creation Time:  08/10/2019 11:20 AM          Signed        No Just follow  Thanks SK

## 2019-08-11 ENCOUNTER — Ambulatory Visit: Payer: Medicare Other | Admitting: Family Medicine

## 2019-08-16 ENCOUNTER — Telehealth: Payer: Self-pay

## 2019-08-16 ENCOUNTER — Ambulatory Visit (INDEPENDENT_AMBULATORY_CARE_PROVIDER_SITE_OTHER): Payer: Medicare Other

## 2019-08-16 DIAGNOSIS — Z95810 Presence of automatic (implantable) cardiac defibrillator: Secondary | ICD-10-CM

## 2019-08-16 DIAGNOSIS — I5022 Chronic systolic (congestive) heart failure: Secondary | ICD-10-CM

## 2019-08-16 NOTE — Progress Notes (Signed)
EPIC Encounter for ICM Monitoring  Patient Name: [name] [name] is a 86 y.o. male Date: 08/16/2019 Primary Care Physican: Dettinger, Fransisca Kaufmann, MD Primary Cardiologist: Caryl Comes Electrophysiologist: Vergie Living Pacing: 92.1% 4/12/2021Weight: 140lb   VT-NS (>4 beats, >162 bpm)    2  Attempted call to patient and unable to reach.  Transmission reviewed.   OptivolThoracic impedance suggesting possible fluid accumulation since 07/26/2019 but showing improvement.  Prescribed:Furosemide 40 mg 1 tablet daily  Labs: 12/11/2018 Creatinine 1.37, BUN 14, Potassium 4.4, Sodium 140, GFR 46-54  Recommendations: Unable to reach.    Follow-up plan: ICM clinic phone appointment on6/09/2019 to recheck fluid levels. 91 day device clinic remote transmission8/16/2021.    Copy of ICM check sent to [place].   3 month ICM trend: 08/16/2019    1 Year ICM trend:       [name], RN 08/16/2019 1:37 PM

## 2019-08-16 NOTE — Telephone Encounter (Signed)
Remote ICM transmission received.  Attempted call to patient regarding ICM remote transmission and no answer or answering machine. 

## 2019-08-30 ENCOUNTER — Ambulatory Visit (INDEPENDENT_AMBULATORY_CARE_PROVIDER_SITE_OTHER): Payer: Medicare Other

## 2019-08-30 DIAGNOSIS — I5022 Chronic systolic (congestive) heart failure: Secondary | ICD-10-CM

## 2019-08-30 DIAGNOSIS — Z95810 Presence of automatic (implantable) cardiac defibrillator: Secondary | ICD-10-CM

## 2019-08-31 NOTE — Progress Notes (Signed)
EPIC Encounter for ICM Monitoring  Patient Name: [name] [name] is a 86 y.o. male Date: 08/31/2019 Primary Care Physican: Dettinger, Fransisca Kaufmann, MD Primary Cardiologist: Caryl Comes Electrophysiologist: Vergie Living Pacing: 89.5% 4/12/2021Weight: 140lb   VT-NS (>4 beats, >162 bpm) 2  Transmission reviewed.   OptivolThoracic impedancereturned close to baseline.  Prescribed:Furosemide 40 mg 1 tablet daily  Labs: 12/11/2018 Creatinine 1.37, BUN 14, Potassium 4.4, Sodium 140, GFR 46-54  Recommendations: None    Follow-up plan: ICM clinic phone appointment on6/28/2021. 91 day device clinic remote transmission8/16/2021.    Copy of ICM check sent to [facility].  3 month ICM trend: 08/30/2019    1 Year ICM trend:       [name], RN 08/31/2019 1:57 PM

## 2019-09-20 ENCOUNTER — Ambulatory Visit (INDEPENDENT_AMBULATORY_CARE_PROVIDER_SITE_OTHER): Payer: Medicare Other

## 2019-09-20 DIAGNOSIS — Z95810 Presence of automatic (implantable) cardiac defibrillator: Secondary | ICD-10-CM

## 2019-09-20 DIAGNOSIS — I5022 Chronic systolic (congestive) heart failure: Secondary | ICD-10-CM

## 2019-09-21 NOTE — Progress Notes (Signed)
EPIC Encounter for ICM Monitoring  Patient Name: [name] [name] is a 86 y.o. male Date: 09/21/2019 Primary Care Physican: Dettinger, Fransisca Kaufmann, MD Primary Cardiologist: Caryl Comes Electrophysiologist: Vergie Living Pacing: 84.2% 6/29/2021Weight: 140lb   VT-NS (>4 beats, >162 bpm)4  Spoke with wife, [name].  She said patient is doing well.    OptivolThoracic impedancenormal.  Prescribed:Furosemide 40 mg 1 tablet daily  Labs: 12/11/2018 Creatinine 1.37, BUN 14, Potassium 4.4, Sodium 140, GFR 46-54  Recommendations: No changes and encouraged to call if experiencing any fluid symptoms.  Follow-up plan: ICM clinic phone appointment on8/04/2019. 91 day device clinic remote transmission8/16/2021.    Copy of ICM check sent to [facility] [facility].   3 month ICM trend: 09/20/2019    1 Year ICM trend:       [name], RN 09/21/2019 4:21 PM

## 2019-09-23 ENCOUNTER — Encounter: Payer: Self-pay | Admitting: Family Medicine

## 2019-09-23 ENCOUNTER — Other Ambulatory Visit: Payer: Self-pay

## 2019-09-23 ENCOUNTER — Ambulatory Visit (INDEPENDENT_AMBULATORY_CARE_PROVIDER_SITE_OTHER): Payer: Medicare Other | Admitting: Family Medicine

## 2019-09-23 VITALS — BP 140/67 | HR 59 | Temp 97.6°F | Ht 67.0 in | Wt 138.0 lb

## 2019-09-23 DIAGNOSIS — N183 Chronic kidney disease, stage 3 unspecified: Secondary | ICD-10-CM | POA: Insufficient documentation

## 2019-09-23 DIAGNOSIS — I5022 Chronic systolic (congestive) heart failure: Secondary | ICD-10-CM

## 2019-09-23 DIAGNOSIS — I10 Essential (primary) hypertension: Secondary | ICD-10-CM | POA: Diagnosis not present

## 2019-09-23 DIAGNOSIS — N1831 Chronic kidney disease, stage 3a: Secondary | ICD-10-CM

## 2019-09-23 DIAGNOSIS — E785 Hyperlipidemia, unspecified: Secondary | ICD-10-CM

## 2019-09-23 MED ORDER — SPIRONOLACTONE 25 MG PO TABS: 12.5000 mg | ORAL_TABLET | Freq: Every day | ORAL | 3 refills | Status: DC

## 2019-09-23 MED ORDER — FUROSEMIDE 40 MG PO TABS: 40.0000 mg | ORAL_TABLET | Freq: Every day | ORAL | 3 refills | Status: DC

## 2019-09-23 NOTE — Progress Notes (Signed)
BP 140/67   Pulse (!) 59   Temp 97.6 F (36.4 C)   Ht _0  (1.702 m)   Wt 138 lb (62.6 kg)   SpO2 95%   BMI 21.61 kg/m    Subjective:   Patient ID: [name], male    DOB: 1930-04-23, 86 y.o.   MRN: [id number]  HPI: WILLIE PLAIN is a 86 y.o. male presenting on 09/23/2019 for Medical Management of Chronic Issues and Hypertension   HPI Hypertension and CHF Patient is currently on spironolactone and Lasix, and their blood pressure today is 140/67. Patient denies any lightheadedness or dizziness. Patient denies headaches, blurred vision, chest pains, shortness of breath, or weakness. Denies any side effects from medication and is content with current medication.   Hyperlipidemia Patient is coming in for recheck of his hyperlipidemia. The patient is currently taking fish oil. They deny any issues with myalgias or history of liver damage from it. They deny any focal numbness or weakness or chest pain.   CKD stage III recheck Patient has stage III CKD likely due to diuretic use because of his congestive heart failure, will continue to monitor, that has been stable and mild.  Relevant past medical, surgical, family and social history reviewed and updated as indicated. Interim medical history since our last visit reviewed. Allergies and medications reviewed and updated.  Review of Systems  Constitutional: Negative for chills and fever.  Eyes: Negative for visual disturbance.  Respiratory: Negative for shortness of breath and wheezing.   Cardiovascular: Negative for chest pain and leg swelling.  Musculoskeletal: Negative for back pain and gait problem.  Skin: Negative for rash.  Neurological: Negative for dizziness, weakness and light-headedness.  All other systems reviewed and are negative.   Per HPI unless specifically indicated above   Allergies as of 09/23/2019      Reactions   Bee Pollen    Pollen Extract    Ramipril Hives, Swelling   Crestor [rosuvastatin] Anxiety        Medication List       Accurate as of September 23, 2019  2:06 PM. If you have any questions, ask your nurse or doctor.        aspirin 81 MG tablet Take 81 mg by mouth daily.   Fish Oil 1000 MG Cpdr Take 1 capsule by mouth daily.   furosemide 40 MG tablet Commonly known as: LASIX Take 1 tablet (40 mg total) by mouth daily.   Ginseng 250 MG Caps Take 1 capsule by mouth daily.   multivitamin with minerals tablet Take 1 tablet by mouth daily.   nitroGLYCERIN 0.4 MG SL tablet Commonly known as: NITROSTAT Place 1 tablet (0.4 mg total) under the tongue every 5 (five) minutes as needed for chest pain.   spironolactone 25 MG tablet Commonly known as: ALDACTONE Take 0.5 tablets (12.5 mg total) by mouth daily.   zinc gluconate 50 MG tablet Take 50 mg by mouth daily.        Objective:   BP 140/67   Pulse (!) 59   Temp 97.6 F (36.4 C)   Ht _1  (1.702 m)   Wt 138 lb (62.6 kg)   SpO2 95%   BMI 21.61 kg/m   Wt Readings from Last 3 Encounters:  09/23/19 138 lb (62.6 kg)  06/16/19 140 lb (63.5 kg)  04/12/19 138 lb 6.4 oz (62.8 kg)    Physical Exam Vitals and nursing note reviewed.  Constitutional:  General: He is not in acute distress.    Appearance: He is well-developed. He is not diaphoretic.  Eyes:     General: No scleral icterus.    Conjunctiva/sclera: Conjunctivae normal.  Neck:     Thyroid: No thyromegaly.  Cardiovascular:     Rate and Rhythm: Normal rate and regular rhythm.     Heart sounds: Normal heart sounds. No murmur heard.   Pulmonary:     Effort: Pulmonary effort is normal. No respiratory distress.     Breath sounds: Normal breath sounds. No wheezing.  Musculoskeletal:        General: Swelling (1+ bilateral lower extremity edema) present.     Cervical back: Neck supple.  Lymphadenopathy:     Cervical: No cervical adenopathy.  Skin:    General: Skin is warm and dry.     Findings: No rash.  Neurological:     Mental Status: He is alert  and oriented to person, place, and time.     Coordination: Coordination normal.  Psychiatric:        Behavior: Behavior normal.       Assessment & Plan:   Problem List Items Addressed This Visit      Cardiovascular and Mediastinum   Essential hypertension - Primary   Relevant Medications   spironolactone (ALDACTONE) 25 MG tablet   furosemide (LASIX) 40 MG tablet   Other Relevant Orders   CBC with Differential/Platelet   CMP14+EGFR   Lipid panel   SYSTOLIC HEART FAILURE, CHRONIC   Relevant Medications   spironolactone (ALDACTONE) 25 MG tablet   furosemide (LASIX) 40 MG tablet     Genitourinary   CKD (chronic kidney disease), stage III     Other   Hyperlipidemia LDL goal <100   Relevant Medications   spironolactone (ALDACTONE) 25 MG tablet   furosemide (LASIX) 40 MG tablet   Other Relevant Orders   CBC with Differential/Platelet   CMP14+EGFR   Lipid panel      Continue current medication, he also sees cardiologist to help manage and has a pacemaker.  He seems to be doing well. Follow up plan: Return in about 6 months (around 03/25/2020), or if symptoms worsen or fail to improve, for Hypertension and CHF and cholesterol and CKD recheck.  Counseling provided for all of the vaccine components No orders of the defined types were placed in this encounter.   Caryl Pina, MD [facility] 09/23/2019, 2:06 PM

## 2019-09-24 LAB — CMP14+EGFR
ALT: 13 IU/L (ref 0–44)
AST: 13 IU/L (ref 0–40)
Albumin/Globulin Ratio: 1.8 (ref 1.2–2.2)
Albumin: 4.3 g/dL (ref 3.6–4.6)
Alkaline Phosphatase: 54 IU/L (ref 48–121)
BUN/Creatinine Ratio: 12 (ref 10–24)
BUN: 17 mg/dL (ref 8–27)
Bilirubin Total: 0.4 mg/dL (ref 0.0–1.2)
CO2: 27 mmol/L (ref 20–29)
Calcium: 9.6 mg/dL (ref 8.6–10.2)
Chloride: 102 mmol/L (ref 96–106)
Creatinine, Ser: 1.39 mg/dL — ABNORMAL HIGH (ref 0.76–1.27)
GFR calc Af Amer: 52 mL/min/{1.73_m2} — ABNORMAL LOW (ref >59)
GFR calc non Af Amer: 45 mL/min/{1.73_m2} — ABNORMAL LOW (ref >59)
Globulin, Total: 2.4 g/dL (ref 1.5–4.5)
Glucose: 106 mg/dL — ABNORMAL HIGH (ref 65–99)
Potassium: 4.3 mmol/L (ref 3.5–5.2)
Sodium: 141 mmol/L (ref 134–144)
Total Protein: 6.7 g/dL (ref 6.0–8.5)

## 2019-09-24 LAB — CBC WITH DIFFERENTIAL/PLATELET
Basophils Absolute: 0.1 10*3/uL (ref 0.0–0.2)
Basos: 1 %
EOS (ABSOLUTE): 0.2 10*3/uL (ref 0.0–0.4)
Eos: 2 %
Hematocrit: 43 % (ref 37.5–51.0)
Hemoglobin: 14.7 g/dL (ref 13.0–17.7)
Immature Grans (Abs): 0 10*3/uL (ref 0.0–0.1)
Immature Granulocytes: 0 %
Lymphocytes Absolute: 2.7 10*3/uL (ref 0.7–3.1)
Lymphs: 33 %
MCH: 29.7 pg (ref 26.6–33.0)
MCHC: 34.2 g/dL (ref 31.5–35.7)
MCV: 87 fL (ref 79–97)
Monocytes Absolute: 0.5 10*3/uL (ref 0.1–0.9)
Monocytes: 6 %
Neutrophils Absolute: 4.7 10*3/uL (ref 1.4–7.0)
Neutrophils: 58 %
Platelets: 151 10*3/uL (ref 150–450)
RBC: 4.95 x10E6/uL (ref 4.14–5.80)
RDW: 12.8 % (ref 11.6–15.4)
WBC: 8.3 10*3/uL (ref 3.4–10.8)

## 2019-09-24 LAB — LIPID PANEL
Chol/HDL Ratio: 5.2 ratio — ABNORMAL HIGH (ref 0.0–5.0)
Cholesterol, Total: 234 mg/dL — ABNORMAL HIGH (ref 100–199)
HDL: 45 mg/dL (ref >39)
LDL Chol Calc (NIH): 164 mg/dL — ABNORMAL HIGH (ref 0–99)
Triglycerides: 140 mg/dL (ref 0–149)
VLDL Cholesterol Cal: 25 mg/dL (ref 5–40)

## 2019-10-25 ENCOUNTER — Ambulatory Visit (INDEPENDENT_AMBULATORY_CARE_PROVIDER_SITE_OTHER): Payer: Medicare Other

## 2019-10-25 DIAGNOSIS — I5022 Chronic systolic (congestive) heart failure: Secondary | ICD-10-CM

## 2019-10-25 DIAGNOSIS — Z95810 Presence of automatic (implantable) cardiac defibrillator: Secondary | ICD-10-CM

## 2019-10-27 ENCOUNTER — Telehealth: Payer: Self-pay

## 2019-10-27 DIAGNOSIS — Z95810 Presence of automatic (implantable) cardiac defibrillator: Secondary | ICD-10-CM

## 2019-10-27 DIAGNOSIS — I5022 Chronic systolic (congestive) heart failure: Secondary | ICD-10-CM | POA: Diagnosis not present

## 2019-10-27 NOTE — Telephone Encounter (Signed)
Remote ICM transmission received.  Attempted call to patient regarding ICM remote transmission and left detailed message per DPR.  Advised to return call for any fluid symptoms or questions. Next ICM remote transmission scheduled 11/30/2019.

## 2019-10-27 NOTE — Progress Notes (Signed)
EPIC Encounter for ICM Monitoring  Patient Name: [name] [name] is a 86 y.o. male Date: 10/27/2019 Primary Care Physican: Dettinger, Fransisca Kaufmann, MD Primary Cardiologist: Caryl Comes Electrophysiologist: Vergie Living Pacing: 88.9% 6/29/2021Weight: 140lb  VT-NS (>4 beats, >162 bpm)5  Attempted call to patient and unable to reach.  Left detailed message per DPR regarding transmission. Transmission reviewed.   OptivolThoracic impedancenormal.  Prescribed:Furosemide 40 mg 1 tablet daily  Labs: 09/23/2019 Creatinine 1.39, BUN 17, Potassium 4.3, Sodium 141, GFR 45-52 A complete set of results can be found in Results Review.  Recommendations: Left voice mail with ICM number and encouraged to call if experiencing any fluid symptoms.  Follow-up plan: ICM clinic phone appointment on9/09/2019. 91 day device clinic remote transmission8/16/2021.    EP/Cardiology Office Visits: 02/07/2020 with Dr. Caryl Comes.    Copy of ICM check sent to Dr. Caryl Comes.   3 month ICM trend: 10/25/2019    1 Year ICM trend:       [name], RN 10/27/2019 12:21 PM

## 2019-11-08 ENCOUNTER — Ambulatory Visit (INDEPENDENT_AMBULATORY_CARE_PROVIDER_SITE_OTHER): Payer: Medicare Other | Admitting: *Deleted

## 2019-11-08 DIAGNOSIS — I5022 Chronic systolic (congestive) heart failure: Secondary | ICD-10-CM | POA: Diagnosis not present

## 2019-11-08 LAB — CUP PACEART REMOTE DEVICE CHECK
Battery Remaining Longevity: 27 mo
Battery Voltage: 2.93 V
Brady Statistic AP VP Percent: 39 %
Brady Statistic AP VS Percent: 0.02 %
Brady Statistic AS VP Percent: 59.31 %
Brady Statistic AS VS Percent: 1.67 %
Brady Statistic RA Percent Paced: 35.66 %
Brady Statistic RV Percent Paced: 89.98 %
Date Time Interrogation Session: 20210816033425
HighPow Impedance: 44 Ohm
HighPow Impedance: 62 Ohm
Implantable Lead Implant Date: 20010823
Implantable Lead Implant Date: 20010823
Implantable Lead Implant Date: 20091119
Implantable Lead Location: 753858
Implantable Lead Location: 753859
Implantable Lead Location: 753860
Implantable Lead Model: 148
Implantable Lead Model: 6940
Implantable Lead Serial Number: 110961
Implantable Pulse Generator Implant Date: 20150921
Lead Channel Impedance Value: 247 Ohm
Lead Channel Impedance Value: 380 Ohm
Lead Channel Impedance Value: 399 Ohm
Lead Channel Impedance Value: 513 Ohm
Lead Channel Impedance Value: 627 Ohm
Lead Channel Impedance Value: 950 Ohm
Lead Channel Pacing Threshold Amplitude: 0.75 V
Lead Channel Pacing Threshold Amplitude: 1 V
Lead Channel Pacing Threshold Amplitude: 2.5 V
Lead Channel Pacing Threshold Pulse Width: 0.4 ms
Lead Channel Pacing Threshold Pulse Width: 0.4 ms
Lead Channel Pacing Threshold Pulse Width: 0.4 ms
Lead Channel Sensing Intrinsic Amplitude: 1.25 mV
Lead Channel Sensing Intrinsic Amplitude: 1.25 mV
Lead Channel Sensing Intrinsic Amplitude: 7.5 mV
Lead Channel Sensing Intrinsic Amplitude: 7.5 mV
Lead Channel Setting Pacing Amplitude: 0.5 V
Lead Channel Setting Pacing Amplitude: 2 V
Lead Channel Setting Pacing Amplitude: 2.5 V
Lead Channel Setting Pacing Pulse Width: 0.03 ms
Lead Channel Setting Pacing Pulse Width: 0.4 ms
Lead Channel Setting Sensing Sensitivity: 0.3 mV

## 2019-11-09 NOTE — Progress Notes (Signed)
Remote ICD transmission.   

## 2019-11-16 IMAGING — DX DG CHEST 2V
2 series · 2 of 2 positions shown · non-contrast
Comparison: 10/02/2016

CLINICAL DATA: Squamous cell skin cancer, history hypertension,
COPD, coronary artery disease post CABG, CHF, ischemic
cardiomyopathy

EXAM:
CHEST  2 VIEW

[chest pa]
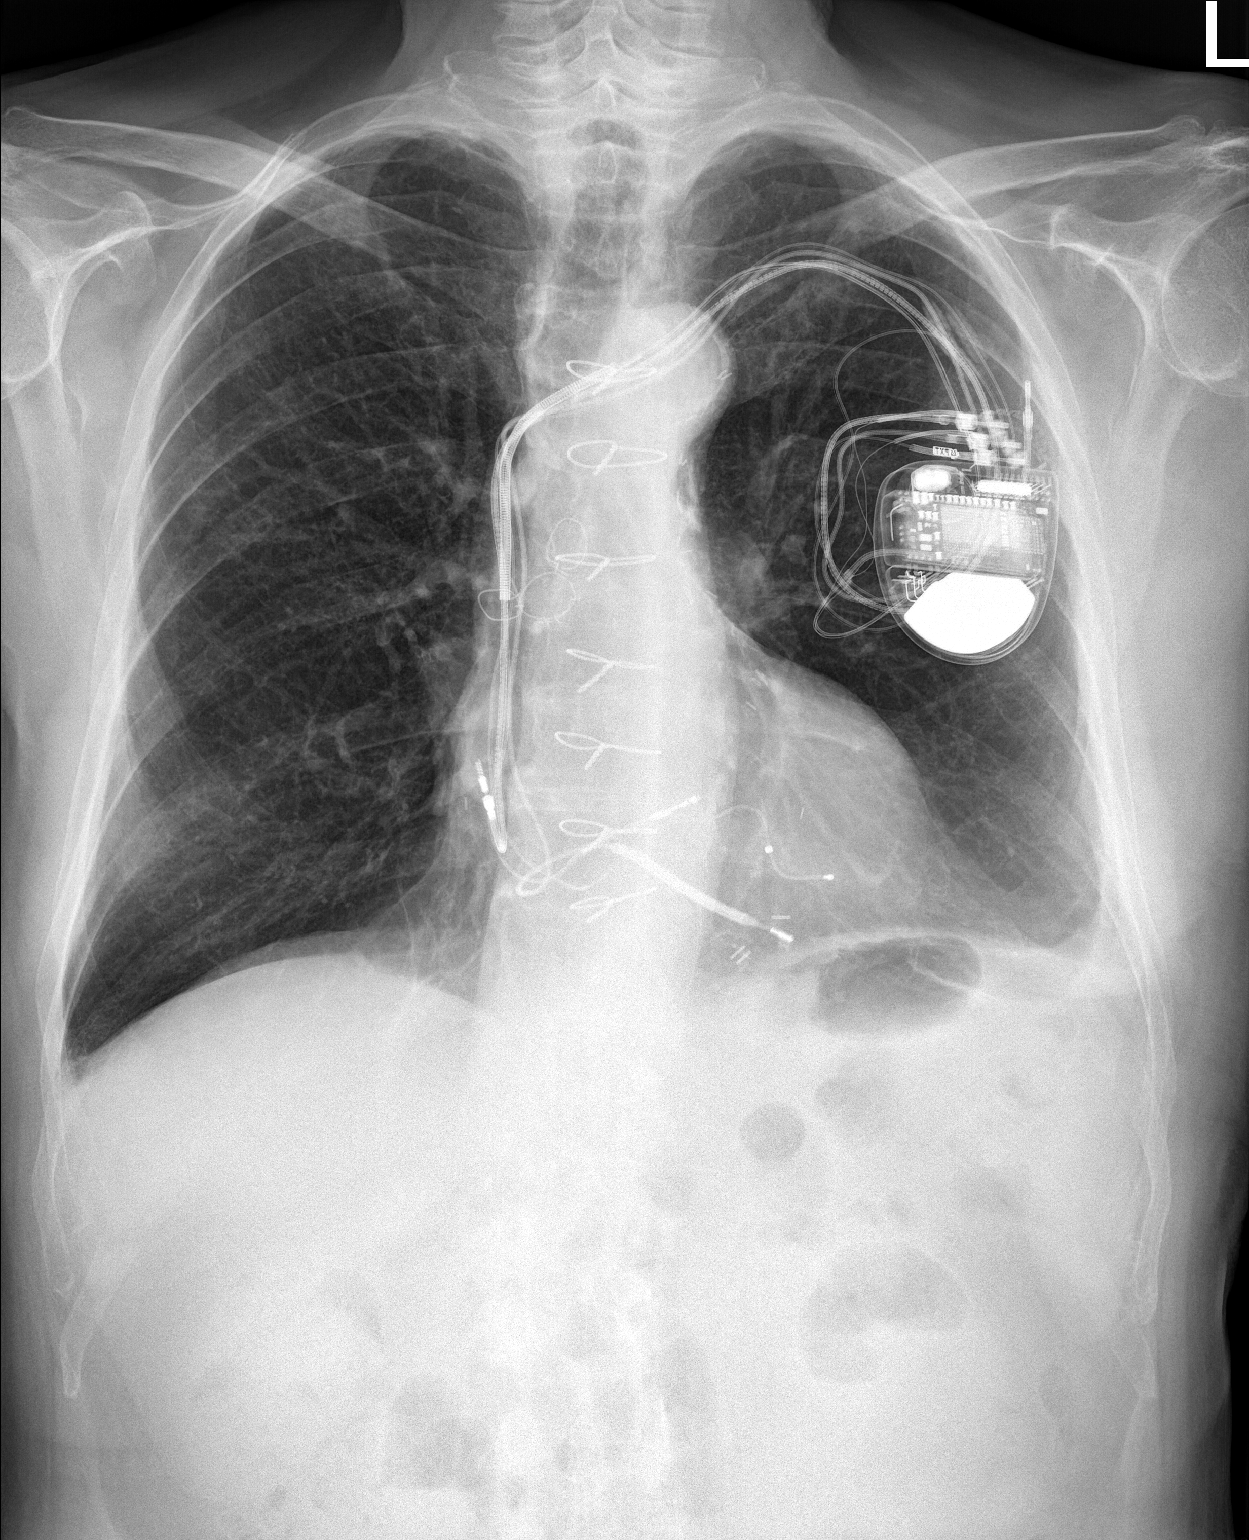

[chest lat]
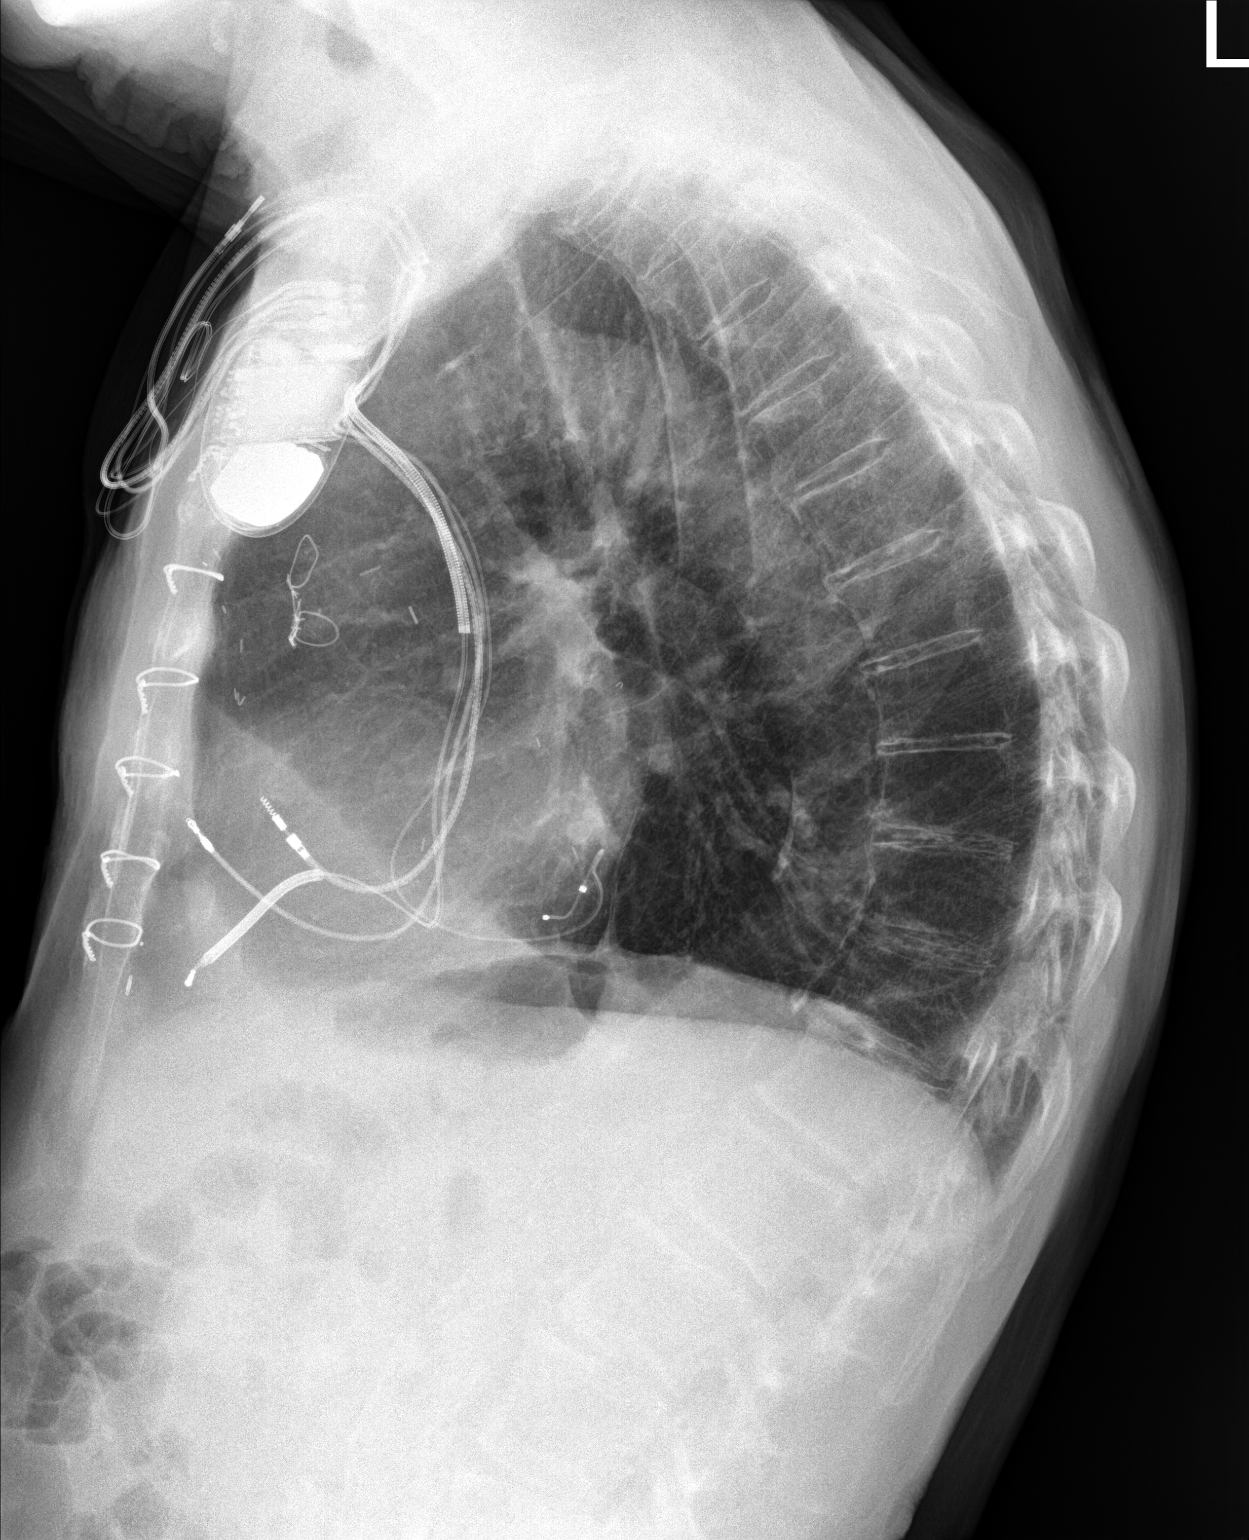

[2 of 2 positions shown; findings below may reference images not displayed]

FINDINGS: LEFT subclavian pacemaker/AICD leads project at RIGHT atrium, RIGHT
ventricle and coronary sinus, unchanged.

Normal heart size post CABG.

Atherosclerotic calcification aorta.

Emphysematous changes consistent with COPD.

Lungs clear.

Blunting of the LEFT costophrenic angle by tiny pleural effusion.

Near complete resolution of RIGHT upper lobe infiltrate seen on
previous exam.

No new infiltrate or pneumothorax.

Bones demineralized.
IMPRESSION: Post CABG and AICD.

COPD changes with minimal LEFT pleural effusion.

Near complete of RIGHT upper lobe infiltrate seen on previous exam.

## 2019-11-30 ENCOUNTER — Ambulatory Visit (INDEPENDENT_AMBULATORY_CARE_PROVIDER_SITE_OTHER): Payer: Medicare Other

## 2019-11-30 DIAGNOSIS — Z95810 Presence of automatic (implantable) cardiac defibrillator: Secondary | ICD-10-CM | POA: Diagnosis not present

## 2019-11-30 DIAGNOSIS — I5022 Chronic systolic (congestive) heart failure: Secondary | ICD-10-CM

## 2019-12-01 NOTE — Progress Notes (Signed)
EPIC Encounter for ICM Monitoring  Patient Name: [name] [name] is a 86 y.o. male Date: 12/01/2019 Primary Care Physican: Dettinger, Fransisca Kaufmann, MD Primary Cardiologist: Caryl Comes Electrophysiologist: Vergie Living Pacing: 91.6% 7/1/2021Office Weight: 138lb  VT-NS (>4 beats, >162 bpm)4  Transmission reviewed.   OptivolThoracic impedancenormal.  Prescribed:Furosemide 40 mg 1 tablet daily  Labs: 09/23/2019 Creatinine 1.39, BUN 17, Potassium 4.3, Sodium 141, GFR 45-52 A complete set of results can be found in Results Review.  Recommendations: None  Follow-up plan: ICM clinic phone appointment on10/01/2020.91 day device clinic remote transmission 02/07/2020.    EP/Cardiology Office Visits: 02/07/2020 with Dr. Caryl Comes.    Copy of ICM check sent to Dr. Caryl Comes.   3 month ICM trend: 11/30/2019    1 Year ICM trend:       [name], RN 12/01/2019 2:51 PM

## 2020-01-03 ENCOUNTER — Telehealth: Payer: Self-pay

## 2020-01-03 ENCOUNTER — Ambulatory Visit (INDEPENDENT_AMBULATORY_CARE_PROVIDER_SITE_OTHER): Payer: Medicare Other

## 2020-01-03 DIAGNOSIS — I5022 Chronic systolic (congestive) heart failure: Secondary | ICD-10-CM | POA: Diagnosis not present

## 2020-01-03 DIAGNOSIS — Z95810 Presence of automatic (implantable) cardiac defibrillator: Secondary | ICD-10-CM | POA: Diagnosis not present

## 2020-01-03 NOTE — Progress Notes (Signed)
EPIC Encounter for ICM Monitoring  Patient Name: [name] [name] is a 86 y.o. male Date: 01/03/2020 Primary Care Physican: Dettinger, Fransisca Kaufmann, MD Primary Cardiologist: Caryl Comes Electrophysiologist: Vergie Living Pacing: 90.9% 7/1/2021Office Weight: 138lb  VT-NS (>4 beats, >162 bpm)14  Attempted call to wife/patient and unable to reach.  Transmission reviewed.   OptivolThoracic impedancesuggesting possible fluid accumulation since 12/25/2019.  Prescribed:Furosemide 40 mg 1 tablet daily  Labs: 09/23/2019 Creatinine1.39, BUN17, Potassium4.3, Sodium141, [id number] A complete set of results can be found in Results Review.  Recommendations:Unable to reach.    Follow-up plan: ICM clinic phone appointment on10/25/2021 to recheck fluid levels.91 day device clinic remote transmission 02/07/2020.    EP/Cardiology Office Visits:02/07/2020 with Dr.Klein.   Copy of ICM check sent to [facility].   3 month ICM trend: 01/03/2020    1 Year ICM trend:       [name], RN 01/03/2020 12:58 PM

## 2020-01-03 NOTE — Telephone Encounter (Signed)
Remote ICM transmission received.  Attempted call to patient regarding ICM remote transmission and no answer or answering machine. 

## 2020-01-17 ENCOUNTER — Ambulatory Visit (INDEPENDENT_AMBULATORY_CARE_PROVIDER_SITE_OTHER): Payer: Medicare Other

## 2020-01-17 DIAGNOSIS — I5022 Chronic systolic (congestive) heart failure: Secondary | ICD-10-CM

## 2020-01-17 DIAGNOSIS — Z95810 Presence of automatic (implantable) cardiac defibrillator: Secondary | ICD-10-CM

## 2020-01-20 ENCOUNTER — Other Ambulatory Visit: Payer: Self-pay

## 2020-01-20 ENCOUNTER — Ambulatory Visit (INDEPENDENT_AMBULATORY_CARE_PROVIDER_SITE_OTHER): Payer: Medicare Other

## 2020-01-20 DIAGNOSIS — Z23 Encounter for immunization: Secondary | ICD-10-CM

## 2020-01-21 NOTE — Patient Instructions (Signed)
16

## 2020-01-21 NOTE — Progress Notes (Signed)
EPIC Encounter for ICM Monitoring  Patient Name: [name] [name] is a 86 y.o. male Date: 01/21/2020 Primary Care Physican: Dettinger, Fransisca Kaufmann, MD Primary Cardiologist: Caryl Comes Electrophysiologist: Vergie Living Pacing: 95.7% LastOfficeWeight: 138lb  VT-NS (>4 beats, >162 bpm)14  Transmission reviewed.   OptivolThoracic impedancesuggesting fluid levels returned to normal.  Prescribed:Furosemide 40 mg 1 tablet daily  Labs: 09/23/2019 Creatinine1.39, BUN17, Potassium4.3, Sodium141, DVO45-14 A complete set of results can be found in Results Review.  Recommendations: No changes  Follow-up plan: ICM clinic phone appointment on11/16/2021.91 day device clinic remote transmission11/15/2021.    EP/Cardiology Office Visits:02/07/2020 with Dr.Klein.   Copy of ICM check sent to [facility].   3 month ICM trend: 01/17/2020    1 Year ICM trend:       [name], RN 01/21/2020 4:45 PM

## 2020-02-07 ENCOUNTER — Ambulatory Visit (INDEPENDENT_AMBULATORY_CARE_PROVIDER_SITE_OTHER): Payer: Medicare Other

## 2020-02-07 DIAGNOSIS — I5022 Chronic systolic (congestive) heart failure: Secondary | ICD-10-CM | POA: Diagnosis not present

## 2020-02-07 DIAGNOSIS — I255 Ischemic cardiomyopathy: Secondary | ICD-10-CM

## 2020-02-07 LAB — CUP PACEART REMOTE DEVICE CHECK
Battery Remaining Longevity: 26 mo
Battery Voltage: 2.93 V
Brady Statistic AP VP Percent: 25.47 %
Brady Statistic AP VS Percent: 0.02 %
Brady Statistic AS VP Percent: 74.19 %
Brady Statistic AS VS Percent: 0.32 %
Brady Statistic RA Percent Paced: 24.21 %
Brady Statistic RV Percent Paced: 94.98 %
Date Time Interrogation Session: 20211115022606
HighPow Impedance: 50 Ohm
HighPow Impedance: 69 Ohm
Implantable Lead Implant Date: 20010823
Implantable Lead Implant Date: 20010823
Implantable Lead Implant Date: 20091119
Implantable Lead Location: 753858
Implantable Lead Location: 753859
Implantable Lead Location: 753860
Implantable Lead Model: 148
Implantable Lead Model: 6940
Implantable Lead Serial Number: 110961
Implantable Pulse Generator Implant Date: 20150921
Lead Channel Impedance Value: 1026 Ohm
Lead Channel Impedance Value: 304 Ohm
Lead Channel Impedance Value: 399 Ohm
Lead Channel Impedance Value: 456 Ohm
Lead Channel Impedance Value: 532 Ohm
Lead Channel Impedance Value: 646 Ohm
Lead Channel Pacing Threshold Amplitude: 0.75 V
Lead Channel Pacing Threshold Amplitude: 1 V
Lead Channel Pacing Threshold Amplitude: 2.5 V
Lead Channel Pacing Threshold Pulse Width: 0.4 ms
Lead Channel Pacing Threshold Pulse Width: 0.4 ms
Lead Channel Pacing Threshold Pulse Width: 0.4 ms
Lead Channel Sensing Intrinsic Amplitude: 1.75 mV
Lead Channel Sensing Intrinsic Amplitude: 1.75 mV
Lead Channel Sensing Intrinsic Amplitude: 3.875 mV
Lead Channel Sensing Intrinsic Amplitude: 3.875 mV
Lead Channel Setting Pacing Amplitude: 0.5 V
Lead Channel Setting Pacing Amplitude: 2 V
Lead Channel Setting Pacing Amplitude: 2.5 V
Lead Channel Setting Pacing Pulse Width: 0.03 ms
Lead Channel Setting Pacing Pulse Width: 0.4 ms
Lead Channel Setting Sensing Sensitivity: 0.3 mV

## 2020-02-07 NOTE — Progress Notes (Signed)
Remote ICD transmission.   

## 2020-02-08 ENCOUNTER — Ambulatory Visit (INDEPENDENT_AMBULATORY_CARE_PROVIDER_SITE_OTHER): Payer: Medicare Other

## 2020-02-08 DIAGNOSIS — Z95810 Presence of automatic (implantable) cardiac defibrillator: Secondary | ICD-10-CM

## 2020-02-08 DIAGNOSIS — I5022 Chronic systolic (congestive) heart failure: Secondary | ICD-10-CM | POA: Diagnosis not present

## 2020-02-09 ENCOUNTER — Telehealth: Payer: Self-pay

## 2020-02-09 NOTE — Telephone Encounter (Signed)
Remote ICM transmission received.  Attempted call to wife regarding ICM remote transmission and left detailed message per DPR.  Advised to return call for any fluid symptoms or questions. Next ICM remote transmission scheduled 03/13/2020.

## 2020-02-09 NOTE — Progress Notes (Signed)
EPIC Encounter for ICM Monitoring  Patient Name: [name] [name] is a 86 y.o. male Date: 02/09/2020 Primary Care Physican: Dettinger, Fransisca Kaufmann, MD Primary Cardiologist: Caryl Comes Electrophysiologist: Vergie Living Pacing: 95% LastOfficeWeight: 138lb  VT-NS (>4 beats, >162 bpm)14  Attempted call to wife [name] [name] and unable to reach.  Left detailed message per DPR regarding transmission. Transmission reviewed.   OptivolThoracic impedancesuggesting fluid levels returned to normal.  Prescribed:Furosemide 40 mg 1 tablet daily  Labs: 09/23/2019 Creatinine1.39, BUN17, Potassium4.3, Sodium141, KPT46-56 A complete set of results can be found in Results Review.  Recommendations: Left voice mail with ICM number and encouraged to call if experiencing any fluid symptoms.  Follow-up plan: ICM clinic phone appointment on12/20/2021.91 day device clinic remote transmission2/14/2022.    EP/Cardiology Office Visits:Recall for 12/13/19 with Dr.Klein.   Copy of ICM check sent to [facility].   3 month ICM trend: 02/08/2020    1 Year ICM trend:       [name], RN 02/09/2020 11:39 AM

## 2020-03-13 ENCOUNTER — Ambulatory Visit (INDEPENDENT_AMBULATORY_CARE_PROVIDER_SITE_OTHER): Payer: Medicare Other

## 2020-03-13 DIAGNOSIS — Z95810 Presence of automatic (implantable) cardiac defibrillator: Secondary | ICD-10-CM

## 2020-03-13 DIAGNOSIS — I5022 Chronic systolic (congestive) heart failure: Secondary | ICD-10-CM

## 2020-03-14 NOTE — Progress Notes (Signed)
EPIC Encounter for ICM Monitoring  Patient Name: [name] [name] is a 86 y.o. male Date: 03/14/2020 Primary Care Physican: Dettinger, Fransisca Kaufmann, MD Primary Cardiologist: [name] [name] Electrophysiologist: [name] [name] Pacing: 94.2% LastOfficeWeight: 138lb  VT-NS (>4 beats, >162 bpm)8 Time in AT/AF  <0.1 hr/day (<0.1%)  Spoke with wife [name] [name] and patient is doing well.  No fluid symptoms.  They try to stay on low salt diet but not checking food labels for salt content.  OptivolThoracic impedancesuggestingpossible fluid accumulation since 03/09/2020 but trending back to baseline normal.  Prescribed:Furosemide 40 mg 1 tablet daily  Labs: 09/23/2019 Creatinine1.39, BUN17, Potassium4.3, Sodium141, CZY60-63 A complete set of results can be found in Results Review.  Recommendations:Recommendation to limit salt intake to 2000 mg daily and fluid intake to 64 oz daily.  Encouraged to call if experiencing any fluid symptoms.   Follow-up plan: ICM clinic phone appointment on2/04/2020.91 day device clinic remote transmission2/14/2022.    EP/Cardiology Office Visits:Recall for 12/13/19 with Dr.Klein.   Copy of ICM check sent to [facility].  3 month ICM trend: 03/14/2020    1 Year ICM trend:       [name] Billings, RN 03/14/2020 9:45 AM

## 2020-03-27 ENCOUNTER — Ambulatory Visit: Payer: Medicare Other | Admitting: Family Medicine

## 2020-04-25 ENCOUNTER — Encounter: Payer: Self-pay | Admitting: Family Medicine

## 2020-04-25 ENCOUNTER — Ambulatory Visit (INDEPENDENT_AMBULATORY_CARE_PROVIDER_SITE_OTHER): Payer: Medicare Other | Admitting: Family Medicine

## 2020-04-25 ENCOUNTER — Other Ambulatory Visit: Payer: Self-pay

## 2020-04-25 VITALS — BP 144/78 | HR 74 | Ht 67.0 in | Wt 134.0 lb

## 2020-04-25 DIAGNOSIS — Z23 Encounter for immunization: Secondary | ICD-10-CM

## 2020-04-25 DIAGNOSIS — E785 Hyperlipidemia, unspecified: Secondary | ICD-10-CM

## 2020-04-25 DIAGNOSIS — I10 Essential (primary) hypertension: Secondary | ICD-10-CM | POA: Diagnosis not present

## 2020-04-25 DIAGNOSIS — J449 Chronic obstructive pulmonary disease, unspecified: Secondary | ICD-10-CM | POA: Diagnosis not present

## 2020-04-25 MED ORDER — SPIRONOLACTONE 25 MG PO TABS: 12.5000 mg | ORAL_TABLET | Freq: Every day | ORAL | 3 refills | Status: DC

## 2020-04-25 MED ORDER — FUROSEMIDE 40 MG PO TABS: 40.0000 mg | ORAL_TABLET | Freq: Every day | ORAL | 3 refills | Status: DC

## 2020-04-25 NOTE — Progress Notes (Signed)
BP (!) 144/78   Pulse 74   Ht '5\' 7"'  (1.702 m)   Wt 134 lb (60.8 kg)   SpO2 96%   BMI 20.99 kg/m    Subjective:   Patient ID: [name] [name], male    DOB: 12/23/1930, 87 y.o.   MRN: [id number]  HPI: [name] [name] is a 87 y.o. male presenting on 04/25/2020 for Medical Management of Chronic Issues, Hyperlipidemia, and Hypertension   HPI Hypertension Patient is currently on furosemide and spironolactone, and their blood pressure today is 144/78. Patient denies any lightheadedness or dizziness. Patient denies headaches, blurred vision, chest pains, shortness of breath, or weakness. Denies any side effects from medication and is content with current medication.   Hyperlipidemia Patient is coming in for recheck of his hyperlipidemia. The patient is currently taking fish oil. They deny any issues with myalgias or history of liver damage from it. They deny any focal numbness or weakness or chest pain.   COPD Patient is coming in for COPD recheck today.  He is currently on no medication currently.  He has a mild chronic cough but denies any major coughing spells or wheezing spells.  He has 0nighttime symptoms per week and 0daytime symptoms per week currently.  He denies any symptoms or issues and says his breathing is doing perfect.  Relevant past medical, surgical, family and social history reviewed and updated as indicated. Interim medical history since our last visit reviewed. Allergies and medications reviewed and updated.  Review of Systems  Constitutional: Negative for chills and fever.  Respiratory: Negative for shortness of breath and wheezing.   Cardiovascular: Negative for chest pain and leg swelling.  Musculoskeletal: Negative for back pain and gait problem.  Skin: Negative for rash.  Neurological: Negative for dizziness, weakness and light-headedness.  All other systems reviewed and are negative.   Per HPI unless specifically indicated above   Allergies as of 04/25/2020       Reactions   Bee Pollen    Pollen Extract    Ramipril Hives, Swelling   Crestor [rosuvastatin] Anxiety      Medication List       Accurate as of April 25, 2020  2:27 PM. If you have any questions, ask your nurse or doctor.        aspirin 81 MG tablet Take 81 mg by mouth daily.   Fish Oil 1000 MG Cpdr Take 1 capsule by mouth daily.   furosemide 40 MG tablet Commonly known as: LASIX Take 1 tablet (40 mg total) by mouth daily.   Ginseng 250 MG Caps Take 1 capsule by mouth daily.   multivitamin with minerals tablet Take 1 tablet by mouth daily.   nitroGLYCERIN 0.4 MG SL tablet Commonly known as: NITROSTAT Place 1 tablet (0.4 mg total) under the tongue every 5 (five) minutes as needed for chest pain.   spironolactone 25 MG tablet Commonly known as: ALDACTONE Take 0.5 tablets (12.5 mg total) by mouth daily.   zinc gluconate 50 MG tablet Take 50 mg by mouth daily.        Objective:   BP (!) 144/78   Pulse 74   Ht '5\' 7"'  (1.702 m)   Wt 134 lb (60.8 kg)   SpO2 96%   BMI 20.99 kg/m   Wt Readings from Last 3 Encounters:  04/25/20 134 lb (60.8 kg)  09/23/19 138 lb (62.6 kg)  06/16/19 140 lb (63.5 kg)    Physical Exam Vitals and nursing note reviewed.  Constitutional:      General: He is not in acute distress.    Appearance: He is well-developed and well-nourished. He is not diaphoretic.  Eyes:     General: No scleral icterus.    Extraocular Movements: EOM normal.     Conjunctiva/sclera: Conjunctivae normal.  Neck:     Thyroid: No thyromegaly.  Cardiovascular:     Rate and Rhythm: Normal rate and regular rhythm.     Pulses: Intact distal pulses.     Heart sounds: Normal heart sounds. No murmur heard.   Pulmonary:     Effort: Pulmonary effort is normal. No respiratory distress.     Breath sounds: Normal breath sounds. No wheezing.  Musculoskeletal:        General: No edema. Normal range of motion.     Cervical back: Neck supple.  Lymphadenopathy:      Cervical: No cervical adenopathy.  Skin:    General: Skin is warm and dry.     Findings: No rash.  Neurological:     Mental Status: He is alert and oriented to person, place, and time.     Coordination: Coordination normal.  Psychiatric:        Mood and Affect: Mood and affect normal.        Behavior: Behavior normal.       Assessment & Plan:   Problem List Items Addressed This Visit      Cardiovascular and Mediastinum   Essential hypertension - Primary   Relevant Medications   furosemide (LASIX) 40 MG tablet   spironolactone (ALDACTONE) 25 MG tablet   Other Relevant Orders   CBC with Differential/Platelet   CMP14+EGFR     Respiratory   COPD (chronic obstructive pulmonary disease) (HCC)   Relevant Orders   CBC with Differential/Platelet     Other   Hyperlipidemia LDL goal <100   Relevant Medications   furosemide (LASIX) 40 MG tablet   spironolactone (ALDACTONE) 25 MG tablet   Other Relevant Orders   Lipid panel    Other Visit Diagnoses    Need for prophylactic vaccination against Streptococcus pneumoniae (pneumococcus)       Relevant Orders   Pneumococcal conjugate vaccine 13-valent      Continue current medication, no changes Follow up plan: Return in about 6 months (around 10/23/2020), or if symptoms worsen or fail to improve, for Hypertension and COPD.  Counseling provided for all of the vaccine components Orders Placed This Encounter  Procedures  . Pneumococcal conjugate vaccine 13-valent  . CBC with Differential/Platelet  . CMP14+EGFR  . Lipid panel    Caryl Pina, MD [facility] 04/25/2020, 2:27 PM

## 2020-04-26 ENCOUNTER — Ambulatory Visit (INDEPENDENT_AMBULATORY_CARE_PROVIDER_SITE_OTHER): Payer: Medicare Other

## 2020-04-26 DIAGNOSIS — Z95810 Presence of automatic (implantable) cardiac defibrillator: Secondary | ICD-10-CM | POA: Diagnosis not present

## 2020-04-26 DIAGNOSIS — I5022 Chronic systolic (congestive) heart failure: Secondary | ICD-10-CM | POA: Diagnosis not present

## 2020-04-26 LAB — CBC WITH DIFFERENTIAL/PLATELET
Basophils Absolute: 0.1 10*3/uL (ref 0.0–0.2)
Basos: 1 %
EOS (ABSOLUTE): 0.2 10*3/uL (ref 0.0–0.4)
Eos: 2 %
Hematocrit: 47.1 % (ref 37.5–51.0)
Hemoglobin: 16.3 g/dL (ref 13.0–17.7)
Immature Grans (Abs): 0 10*3/uL (ref 0.0–0.1)
Immature Granulocytes: 0 %
Lymphocytes Absolute: 3.1 10*3/uL (ref 0.7–3.1)
Lymphs: 29 %
MCH: 30.6 pg (ref 26.6–33.0)
MCHC: 34.6 g/dL (ref 31.5–35.7)
MCV: 89 fL (ref 79–97)
Monocytes Absolute: 0.7 10*3/uL (ref 0.1–0.9)
Monocytes: 6 %
Neutrophils Absolute: 6.6 10*3/uL (ref 1.4–7.0)
Neutrophils: 62 %
Platelets: 164 10*3/uL (ref 150–450)
RBC: 5.32 x10E6/uL (ref 4.14–5.80)
RDW: 12.4 % (ref 11.6–15.4)
WBC: 10.6 10*3/uL (ref 3.4–10.8)

## 2020-04-26 LAB — CMP14+EGFR
ALT: 8 IU/L (ref 0–44)
AST: 18 IU/L (ref 0–40)
Albumin/Globulin Ratio: 1.7 (ref 1.2–2.2)
Albumin: 4.5 g/dL (ref 3.6–4.6)
Alkaline Phosphatase: 63 IU/L (ref 44–121)
BUN/Creatinine Ratio: 15 (ref 10–24)
BUN: 24 mg/dL (ref 8–27)
Bilirubin Total: 0.4 mg/dL (ref 0.0–1.2)
CO2: 26 mmol/L (ref 20–29)
Calcium: 9.9 mg/dL (ref 8.6–10.2)
Chloride: 99 mmol/L (ref 96–106)
Creatinine, Ser: 1.62 mg/dL — ABNORMAL HIGH (ref 0.76–1.27)
GFR calc Af Amer: 43 mL/min/{1.73_m2} — ABNORMAL LOW (ref >59)
GFR calc non Af Amer: 38 mL/min/{1.73_m2} — ABNORMAL LOW (ref >59)
Globulin, Total: 2.6 g/dL (ref 1.5–4.5)
Glucose: 99 mg/dL (ref 65–99)
Potassium: 4.3 mmol/L (ref 3.5–5.2)
Sodium: 139 mmol/L (ref 134–144)
Total Protein: 7.1 g/dL (ref 6.0–8.5)

## 2020-04-26 LAB — LIPID PANEL
Chol/HDL Ratio: 6.2 ratio — ABNORMAL HIGH (ref 0.0–5.0)
Cholesterol, Total: 266 mg/dL — ABNORMAL HIGH (ref 100–199)
HDL: 43 mg/dL (ref >39)
LDL Chol Calc (NIH): 185 mg/dL — ABNORMAL HIGH (ref 0–99)
Triglycerides: 201 mg/dL — ABNORMAL HIGH (ref 0–149)
VLDL Cholesterol Cal: 38 mg/dL (ref 5–40)

## 2020-05-01 NOTE — Progress Notes (Signed)
EPIC Encounter for ICM Monitoring  Patient Name: [name] [name] is a 87 y.o. male Date: 05/01/2020 Primary Care Physican: Dettinger, Fransisca Kaufmann, MD Primary Cardiologist: Caryl Comes Electrophysiologist: Vergie Living Pacing: 94.7% 04/25/2020 Weight: 134lb  VT-NS (>4 beats, >162 bpm)17 Time in AT/AF  0.0 hr/day (0.0%)  Spoke with patient and reports feeling well at this time.  Denies fluid symptoms.    OptivolThoracic impedancesuggesting normal levels.  Prescribed:Furosemide 40 mg 1 tablet daily  Labs: 09/23/2019 Creatinine1.39, BUN17, Potassium4.3, Sodium141, [id number] A complete set of results can be found in Results Review.  Recommendations:No changes and encouraged to call if experiencing any fluid symptoms.  Follow-up plan: ICM clinic phone appointment on3/09/2020.91 day device clinic remote transmission2/14/2022.    EP/Cardiology Office Visits:06/19/2020 with Dr.Klein.   Copy of ICM check sent to [facility].  3 month ICM trend: 04/25/2020.    1 Year ICM trend:       [name], RN 05/01/2020 3:54 PM

## 2020-05-04 ENCOUNTER — Other Ambulatory Visit: Payer: Self-pay

## 2020-05-04 DIAGNOSIS — E785 Hyperlipidemia, unspecified: Secondary | ICD-10-CM

## 2020-05-04 MED ORDER — EZETIMIBE 10 MG PO TABS: 10.0000 mg | ORAL_TABLET | Freq: Every day | ORAL | 1 refills | Status: DC

## 2020-05-04 NOTE — Progress Notes (Signed)
Zetia 10 mg sent in for 90 days per result note from Dettinger - patient aware

## 2020-05-08 ENCOUNTER — Ambulatory Visit (INDEPENDENT_AMBULATORY_CARE_PROVIDER_SITE_OTHER): Payer: Medicare Other

## 2020-05-08 DIAGNOSIS — I255 Ischemic cardiomyopathy: Secondary | ICD-10-CM | POA: Diagnosis not present

## 2020-05-09 LAB — CUP PACEART REMOTE DEVICE CHECK
Battery Remaining Longevity: 23 mo
Battery Voltage: 2.92 V
Brady Statistic AP VP Percent: 33.65 %
Brady Statistic AP VS Percent: 0.03 %
Brady Statistic AS VP Percent: 66.01 %
Brady Statistic AS VS Percent: 0.32 %
Brady Statistic RA Percent Paced: 31.36 %
Brady Statistic RV Percent Paced: 93.14 %
Date Time Interrogation Session: 20220214012406
HighPow Impedance: 52 Ohm
HighPow Impedance: 75 Ohm
Implantable Lead Implant Date: 20010823
Implantable Lead Implant Date: 20010823
Implantable Lead Implant Date: 20091119
Implantable Lead Location: 753858
Implantable Lead Location: 753859
Implantable Lead Location: 753860
Implantable Lead Model: 148
Implantable Lead Model: 6940
Implantable Lead Serial Number: 110961
Implantable Pulse Generator Implant Date: 20150921
Lead Channel Impedance Value: 1045 Ohm
Lead Channel Impedance Value: 323 Ohm
Lead Channel Impedance Value: 399 Ohm
Lead Channel Impedance Value: 494 Ohm
Lead Channel Impedance Value: 532 Ohm
Lead Channel Impedance Value: 665 Ohm
Lead Channel Pacing Threshold Amplitude: 0.75 V
Lead Channel Pacing Threshold Amplitude: 1 V
Lead Channel Pacing Threshold Amplitude: 2.5 V
Lead Channel Pacing Threshold Pulse Width: 0.4 ms
Lead Channel Pacing Threshold Pulse Width: 0.4 ms
Lead Channel Pacing Threshold Pulse Width: 0.4 ms
Lead Channel Sensing Intrinsic Amplitude: 1.875 mV
Lead Channel Sensing Intrinsic Amplitude: 1.875 mV
Lead Channel Sensing Intrinsic Amplitude: 3.625 mV
Lead Channel Sensing Intrinsic Amplitude: 3.625 mV
Lead Channel Setting Pacing Amplitude: 0.5 V
Lead Channel Setting Pacing Amplitude: 2 V
Lead Channel Setting Pacing Amplitude: 2.5 V
Lead Channel Setting Pacing Pulse Width: 0.03 ms
Lead Channel Setting Pacing Pulse Width: 0.4 ms
Lead Channel Setting Sensing Sensitivity: 0.3 mV

## 2020-05-11 NOTE — Progress Notes (Signed)
Remote ICD transmission.   

## 2020-05-29 ENCOUNTER — Ambulatory Visit (INDEPENDENT_AMBULATORY_CARE_PROVIDER_SITE_OTHER): Payer: Medicare Other

## 2020-05-29 DIAGNOSIS — Z95810 Presence of automatic (implantable) cardiac defibrillator: Secondary | ICD-10-CM | POA: Diagnosis not present

## 2020-05-29 DIAGNOSIS — I5022 Chronic systolic (congestive) heart failure: Secondary | ICD-10-CM

## 2020-06-02 NOTE — Progress Notes (Signed)
EPIC Encounter for ICM Monitoring  Patient Name: [name] [name] is a 87 y.o. male Date: 06/02/2020 Primary Care Physican: Dettinger, Fransisca Kaufmann, MD Primary Cardiologist: Caryl Comes Electrophysiologist: Vergie Living Pacing: 92.5% 06/02/2020 Weight: 140lb  VT-NS (>4 beats, >162 bpm)5 Time in AT/AF 0.0 hr/day (0.0%)  Spoke with patient and reports feeling well at this time.  Denies fluid symptoms.    OptivolThoracic impedancesuggesting normal levels.  Prescribed:Furosemide 40 mg 1 tablet daily  Labs: 09/23/2019 Creatinine1.39, BUN17, Potassium4.3, Sodium141, [id number] A complete set of results can be found in Results Review.  Recommendations: No changes and encouraged to call if experiencing any fluid symptoms.  Follow-up plan: ICM clinic phone appointment on4/01/2021.91 day device clinic remote transmission5/16/2022.    EP/Cardiology Office Visits:06/19/2020 with Dr.Klein.   Copy of ICM check sent to [facility].  3 month ICM trend: 05/29/2020.    1 Year ICM trend:       [name], RN 06/02/2020 9:23 AM

## 2020-06-19 ENCOUNTER — Encounter: Payer: Self-pay | Admitting: Internal Medicine

## 2020-06-19 ENCOUNTER — Ambulatory Visit: Payer: Medicare Other | Admitting: Internal Medicine

## 2020-06-19 ENCOUNTER — Other Ambulatory Visit: Payer: Self-pay

## 2020-06-19 VITALS — BP 130/68 | HR 74 | Ht 67.0 in | Wt 129.0 lb

## 2020-06-19 DIAGNOSIS — I5022 Chronic systolic (congestive) heart failure: Secondary | ICD-10-CM

## 2020-06-19 DIAGNOSIS — R002 Palpitations: Secondary | ICD-10-CM

## 2020-06-19 DIAGNOSIS — Z95810 Presence of automatic (implantable) cardiac defibrillator: Secondary | ICD-10-CM

## 2020-06-19 DIAGNOSIS — I255 Ischemic cardiomyopathy: Secondary | ICD-10-CM | POA: Diagnosis not present

## 2020-06-19 DIAGNOSIS — Z951 Presence of aortocoronary bypass graft: Secondary | ICD-10-CM

## 2020-06-19 NOTE — Patient Instructions (Signed)
Medication Instructions:  Your physician recommends that you continue on your current medications as directed. Please refer to the Current Medication list given to you today.  *If you need a refill on your cardiac medications before your next appointment, please call your pharmacy*   Lab Work: None ordered.  If you have labs (blood work) drawn today and your tests are completely normal, you will receive your results only by: Marland Kitchen MyChart Message (if you have MyChart) OR . A paper copy in the mail If you have any lab test that is abnormal or we need to change your treatment, we will call you to review the results.   Testing/Procedure: Your physician has requested that you have a lexiscan myoview. For further information please visit [email or web address]. Please follow instruction sheet, as given.     Follow-Up: At [facility], you and your health needs are our priority.  As part of our continuing mission to provide you with exceptional heart care, we have created designated Provider Care Teams.  These Care Teams include your primary Cardiologist (physician) and Advanced Practice Providers (APPs -  Physician Assistants and Nurse Practitioners) who all work together to provide you with the care you need, when you need it.  We recommend signing up for the patient portal called "MyChart".  Sign up information is provided on this After Visit Summary.  MyChart is used to connect with patients for Virtual Visits (Telemedicine).  Patients are able to view lab/test results, encounter notes, upcoming appointments, etc.  Non-urgent messages can be sent to your provider as well.   To learn more about what you can do with MyChart, go to [email or web address].    Your next appointment:   12 month(s)  The format for your next appointment:   In Person  Provider:   Virl Axe, MD

## 2020-06-19 NOTE — Progress Notes (Signed)
Patient Care Team: Dettinger, Fransisca Kaufmann, MD as PCP - General (Family Medicine)   HPI  [name] [name] is a 87 y.o. male seen in followup for Ischemic cardiomyopathy congestive heart failure for which he is s/p CRT-D implantation.  Device generator placement 9/15 and maintaining of CRT-D   he is s/p CABG    Device is programmed to subthreshold RV pacing output.  This dates back to December 2017.  Looking back at the notes they are mine and unfortunately incomplete.  There was a high RV pacing output of 3.5 V.  I presume it was done to allow for left ventricular only pacing.  This was done 9/17.  He has stable low amplitude R waves  He says his breathing is much better since his AV optimization echo;  History of frequent PVCs previously on amiodarone subsequently discontinued   The patient denies chest pain,, nocturnal dyspnea, orthopnea or peripheral edema.  There have been no  syncope.  Significant dyspnea on exertion even at 50 feet.  Some lightheadedness. DATE TEST EF   7/11 LHC  40 % SVG-RCA-T; SVG-D1-T  9/17 Echo   40-45 %            Date Cr K TSH  3/15    4.9    7/18  1.85 4.7 2.62  1/19 1.4 4.8   9/20 1.37 4.4   2/22 1.62 4.3        Past Surgical History:  Procedure Laterality Date  . BI-VENTRICULAR IMPLANTABLE CARDIOVERTER DEFIBRILLATOR  (CRT-D)  2005; 2015   STJ CRTD implanted by Dr Caryl Comes  . BI-VENTRICULAR IMPLANTABLE CARDIOVERTER DEFIBRILLATOR UPGRADE N/A 12/13/2013   Procedure: BI-VENTRICULAR IMPLANTABLE CARDIOVERTER DEFIBRILLATOR UPGRADE;  Surgeon: [name], MD;  Location[facility] C[facility] LAB;  Service: Cardiovascular;  Laterality: N/A;  . CORONARY ARTERY BYPASS GRAFT  1989    Current Outpatient Medications  Medication Sig Dispense Refill  . aspirin 81 MG tablet Take 81 mg by mouth daily.    . furosemide (LASIX) 40 MG tablet Take 1 tablet (40 mg total) by mouth daily. 90 tablet 3  . Ginseng 250 MG CAPS Take 1 capsule by mouth daily.    . Multiple  Vitamins-Minerals (MULTIVITAMIN WITH MINERALS) tablet Take 1 tablet by mouth daily.    . nitroGLYCERIN (NITROSTAT) 0.4 MG SL tablet Place 1 tablet (0.4 mg total) under the tongue every 5 (five) minutes as needed for chest pain. 25 tablet 6  . Omega-3 Fatty Acids (FISH OIL) 1000 MG CPDR Take 1 capsule by mouth daily.     Marland Kitchen spironolactone (ALDACTONE) 25 MG tablet Take 0.5 tablets (12.5 mg total) by mouth daily. 45 tablet 3  . zinc gluconate 50 MG tablet Take 50 mg by mouth daily.     No current facility-administered medications for this visit.    Allergies  Allergen Reactions  . Bee Pollen   . Pollen Extract   . Ramipril Hives and Swelling  . Crestor [Rosuvastatin] Anxiety    Review of Systems negative except from HPI and PMH  Physical Exam BP 130/68   Pulse 74   Ht 5\' 7"  (1.702 m)   Wt 129 lb (58.5 kg)   SpO2 98%   BMI 20.20 kg/m  Well developed and well nourished in no acute distress HENT normal Neck supple with JVP-flat Clear Device pocket well healed; without hematoma or erythema.  There is no tethering  Regular rate and rhythm, no  murmur Abd-soft with active BS No  Clubbing cyanosis edema Skin-warm and dry A & Oriented  Grossly normal sensory and motor function  ECG AV pacing with an upright QRS lead I and a negative QRS lead V1.  QRS D about 114 ms compared with 3/21 with the upright QRS lead V1 was noted.  I wonder whether this is lead placement error  Assessment and  Plan  Ischemic cardiomyopathy s/p CABG is dizzy before she stands for me look at her chart as I remember  Congestive heart failure-chronic-systolic  CRT-D- [employer] The patient's device was interrogated.  The information was reviewed. No changes were made in the programming.     PVCs   Worsening dyspnea on exertion with an antecedent history of some chest discomfort.  He has known coronary disease    His device is programmed with a very short AV delay associated with electrocardiographic  optimization; since his last ECG the upright QRS in lead V1 is been lost.  As noted above I wonder whether is related to lead placement i.e. in the second or third intercostal space as opposed to the fourth.  However, in light of his worsening symptoms makes me wonder.  We will undertake a Lexi Myoview to assess ischemia as well as LV function.  May need an echocardiogram given the difficulty we have had with LV function assessments using nuclear imaging  He is euvolemic.  He may benefit from the addition of an SGLT2 and other medications depending on his ejection fraction

## 2020-06-30 ENCOUNTER — Ambulatory Visit (HOSPITAL_COMMUNITY): Payer: Medicare Other | Attending: Cardiology

## 2020-06-30 ENCOUNTER — Other Ambulatory Visit: Payer: Self-pay

## 2020-06-30 DIAGNOSIS — Z951 Presence of aortocoronary bypass graft: Secondary | ICD-10-CM | POA: Diagnosis not present

## 2020-06-30 LAB — MYOCARDIAL PERFUSION IMAGING
LV dias vol: 137 mL (ref 62–150)
LV sys vol: 104 mL
Peak HR: 94 {beats}/min
Rest HR: 74 {beats}/min
SDS: 5
SRS: 12
SSS: 18
TID: 1.08

## 2020-06-30 MED ORDER — TECHNETIUM TC 99M TETROFOSMIN IV KIT
9.6000 | PACK | Freq: Once | INTRAVENOUS | Status: AC | PRN
  Administered 2020-06-30: 9.6 via INTRAVENOUS
  Filled 2020-06-30: qty 10

## 2020-06-30 MED ORDER — REGADENOSON 0.4 MG/5ML IV SOLN
0.4000 mg | Freq: Once | INTRAVENOUS | Status: AC
  Administered 2020-06-30: 0.4 mg via INTRAVENOUS

## 2020-06-30 MED ORDER — TECHNETIUM TC 99M TETROFOSMIN IV KIT
31.5000 | PACK | Freq: Once | INTRAVENOUS | Status: AC | PRN
Start: 2020-06-30 — End: 2020-06-30
  Administered 2020-06-30: 31.5 via INTRAVENOUS
  Filled 2020-06-30: qty 32

## 2020-07-03 ENCOUNTER — Ambulatory Visit (INDEPENDENT_AMBULATORY_CARE_PROVIDER_SITE_OTHER): Payer: Medicare Other

## 2020-07-03 DIAGNOSIS — Z95810 Presence of automatic (implantable) cardiac defibrillator: Secondary | ICD-10-CM | POA: Diagnosis not present

## 2020-07-03 DIAGNOSIS — I5022 Chronic systolic (congestive) heart failure: Secondary | ICD-10-CM | POA: Diagnosis not present

## 2020-07-05 ENCOUNTER — Telehealth: Payer: Self-pay

## 2020-07-05 NOTE — Telephone Encounter (Signed)
Remote ICM transmission received.  Attempted call to patient regarding ICM remote transmission and left detailed message per DPR.  Advised to return call for any fluid symptoms or questions. Next ICM remote transmission scheduled 08/22/2020.

## 2020-07-05 NOTE — Progress Notes (Signed)
EPIC Encounter for ICM Monitoring  Patient Name: [name] [name] is a 87 y.o. male Date: 07/05/2020 Primary Care Physican: [name], [name] Kaufmann, MD Primary Cardiologist: [name] [name] Electrophysiologist: [name] [name] Pacing: 96.5% 3/11/2022Weight: 140lb  VT-NS (>4 beats, >162 bpm)1 Time in AT/AF0.0 hr/day (0.0%)  Attempted call to wife/patient and unable to reach.  Left detailed message per DPR regarding transmission. Transmission reviewed.    OptivolThoracic impedancesuggestingnormallevels.  Prescribed:Furosemide 40 mg 1 tablet daily  Labs: 09/23/2019 Creatinine1.39, BUN17, Potassium4.3, Sodium141, [id number] A complete set of results can be found in Results Review.  Recommendations: Left voice mail with ICM number and encouraged to call if experiencing any fluid symptoms.  Follow-up plan: ICM clinic phone appointment on5/31/2022.91 day device clinic remote transmission5/16/2022.    EP/Cardiology Office Visits:Recall 3/23/2023with Dr.Klein.   Copy of ICM check sent to [name] [name].  3 month ICM trend: 07/03/2020.    1 Year ICM trend:       [name], RN 07/05/2020 1:19 PM

## 2020-07-06 ENCOUNTER — Telehealth: Payer: Self-pay

## 2020-07-06 DIAGNOSIS — I429 Cardiomyopathy, unspecified: Secondary | ICD-10-CM

## 2020-07-06 DIAGNOSIS — I255 Ischemic cardiomyopathy: Secondary | ICD-10-CM

## 2020-07-06 NOTE — Telephone Encounter (Signed)
-----   Message from [name], MD sent at 07/01/2020  4:29 PM EDT ----- Please Inform Patient that stress test showed no evidence of ischemia, but worsening  heart muscle function #now measured about 25 % ( previously about 40-45)  We may want to do a cath, but would like to confirm the EF which can be badly estimated by nuclear studyy, so lets do an echo plz   Thanks

## 2020-07-06 NOTE — Telephone Encounter (Signed)
Spoke with pt and advised per Dr Caryl Comes pt's stress testing showed no ischemia but did show worsening heart muscle function.  Pt will need echo to confirm this.  Pt verbalizes understanding and agrees with proceeding with echo.  Order placed.

## 2020-08-07 ENCOUNTER — Ambulatory Visit (INDEPENDENT_AMBULATORY_CARE_PROVIDER_SITE_OTHER): Payer: Medicare Other

## 2020-08-07 DIAGNOSIS — I255 Ischemic cardiomyopathy: Secondary | ICD-10-CM

## 2020-08-08 ENCOUNTER — Other Ambulatory Visit (HOSPITAL_COMMUNITY): Payer: Medicare Other

## 2020-08-08 LAB — CUP PACEART REMOTE DEVICE CHECK
Battery Remaining Longevity: 21 mo
Battery Voltage: 2.9 V
Brady Statistic AP VP Percent: 33.99 %
Brady Statistic AP VS Percent: 0.02 %
Brady Statistic AS VP Percent: 65.73 %
Brady Statistic AS VS Percent: 0.26 %
Brady Statistic RA Percent Paced: 32.57 %
Brady Statistic RV Percent Paced: 95.71 %
Date Time Interrogation Session: 20220516223832
HighPow Impedance: 50 Ohm
HighPow Impedance: 69 Ohm
Implantable Lead Implant Date: 20010823
Implantable Lead Implant Date: 20010823
Implantable Lead Implant Date: 20091119
Implantable Lead Location: 753858
Implantable Lead Location: 753859
Implantable Lead Location: 753860
Implantable Lead Model: 148
Implantable Lead Model: 6940
Implantable Lead Serial Number: 110961
Implantable Pulse Generator Implant Date: 20150921
Lead Channel Impedance Value: 1026 Ohm
Lead Channel Impedance Value: 304 Ohm
Lead Channel Impedance Value: 456 Ohm
Lead Channel Impedance Value: 494 Ohm
Lead Channel Impedance Value: 532 Ohm
Lead Channel Impedance Value: 627 Ohm
Lead Channel Pacing Threshold Amplitude: 0.75 V
Lead Channel Pacing Threshold Amplitude: 0.75 V
Lead Channel Pacing Threshold Amplitude: 2.5 V
Lead Channel Pacing Threshold Pulse Width: 0.4 ms
Lead Channel Pacing Threshold Pulse Width: 0.4 ms
Lead Channel Pacing Threshold Pulse Width: 0.4 ms
Lead Channel Sensing Intrinsic Amplitude: 2.75 mV
Lead Channel Sensing Intrinsic Amplitude: 2.75 mV
Lead Channel Sensing Intrinsic Amplitude: 3.375 mV
Lead Channel Sensing Intrinsic Amplitude: 3.375 mV
Lead Channel Setting Pacing Amplitude: 0.5 V
Lead Channel Setting Pacing Amplitude: 1.75 V
Lead Channel Setting Pacing Amplitude: 2.5 V
Lead Channel Setting Pacing Pulse Width: 0.03 ms
Lead Channel Setting Pacing Pulse Width: 0.4 ms
Lead Channel Setting Sensing Sensitivity: 0.3 mV

## 2020-08-10 ENCOUNTER — Ambulatory Visit (HOSPITAL_COMMUNITY): Payer: Medicare Other | Attending: Cardiology

## 2020-08-10 ENCOUNTER — Other Ambulatory Visit: Payer: Self-pay

## 2020-08-10 DIAGNOSIS — E785 Hyperlipidemia, unspecified: Secondary | ICD-10-CM | POA: Diagnosis not present

## 2020-08-10 DIAGNOSIS — J449 Chronic obstructive pulmonary disease, unspecified: Secondary | ICD-10-CM | POA: Insufficient documentation

## 2020-08-10 DIAGNOSIS — I429 Cardiomyopathy, unspecified: Secondary | ICD-10-CM

## 2020-08-10 DIAGNOSIS — I110 Hypertensive heart disease with heart failure: Secondary | ICD-10-CM | POA: Diagnosis not present

## 2020-08-10 DIAGNOSIS — I509 Heart failure, unspecified: Secondary | ICD-10-CM | POA: Diagnosis not present

## 2020-08-10 DIAGNOSIS — I083 Combined rheumatic disorders of mitral, aortic and tricuspid valves: Secondary | ICD-10-CM | POA: Diagnosis not present

## 2020-08-10 DIAGNOSIS — I2510 Atherosclerotic heart disease of native coronary artery without angina pectoris: Secondary | ICD-10-CM | POA: Insufficient documentation

## 2020-08-10 DIAGNOSIS — Z87891 Personal history of nicotine dependence: Secondary | ICD-10-CM | POA: Insufficient documentation

## 2020-08-10 LAB — ECHOCARDIOGRAM COMPLETE
AR max vel: 1.48 cm2
AV Area VTI: 1.43 cm2
AV Area mean vel: 1.55 cm2
AV Mean grad: 9 mmHg
AV Peak grad: 20 mmHg
Ao pk vel: 2.23 m/s
Area-P 1/2: 2.62 cm2
MV M vel: 5.5 m/s
MV Peak grad: 121 mmHg
S' Lateral: 5 cm

## 2020-08-16 ENCOUNTER — Telehealth: Payer: Self-pay | Admitting: Internal Medicine

## 2020-08-16 NOTE — Telephone Encounter (Signed)
Patient's wife was calling in to get results from patient procedure. Wife ask to be call at [phone] once the results are in. Please advise

## 2020-08-16 NOTE — Telephone Encounter (Signed)
Attempted to call patient to clarify which results are needed.  Called [phone] voice mail is not set up and [phone] phone continued to ring unable to leave a message.

## 2020-08-22 ENCOUNTER — Ambulatory Visit (INDEPENDENT_AMBULATORY_CARE_PROVIDER_SITE_OTHER): Payer: Medicare Other

## 2020-08-22 DIAGNOSIS — I5022 Chronic systolic (congestive) heart failure: Secondary | ICD-10-CM | POA: Diagnosis not present

## 2020-08-22 DIAGNOSIS — Z95810 Presence of automatic (implantable) cardiac defibrillator: Secondary | ICD-10-CM

## 2020-08-23 NOTE — Progress Notes (Signed)
EPIC Encounter for ICM Monitoring  Patient Name: [name] [name] is a 87 y.o. male Date: 08/23/2020 Primary Care Physican: Dettinger, Fransisca Kaufmann, MD Primary Cardiologist: Caryl Comes Electrophysiologist: Vergie Living Pacing: 96.2% 3/11/2022Weight: 140lb  Time in AT/AF0.0 hr/day (0.0%)  Transmission reviewed.   OptivolThoracic impedancesuggestingnormallevels.  Prescribed:Furosemide 40 mg 1 tablet daily  Labs: 05/15/2020 Creatinine 1.62, BUN 24, Potassium 4.3, Sodium 139, GFR 43 A complete set of results can be found in Results Review.  Recommendations: No changes.  Follow-up plan: ICM clinic phone appointment on7/01/2021.91 day device clinic remote transmission8/15/2022.    EP/Cardiology Office Visits:Recall 3/23/2023with Dr.Klein.   Copy of ICM check sent to [place].  3 month ICM trend: 08/22/2020.    1 Year ICM trend:       [name], RN 08/23/2020 5:15 PM

## 2020-08-24 MED ORDER — ISOSORBIDE MONONITRATE ER 30 MG PO TB24: 30.0000 mg | ORAL_TABLET | Freq: Every morning | ORAL | 3 refills | Status: DC

## 2020-08-24 NOTE — Telephone Encounter (Signed)
Patient's wife calling back for echo results. She states she has been having trouble with her phone and for now to call: [phone], until she can get her old phone number back.

## 2020-08-24 NOTE — Telephone Encounter (Signed)
Per Dr Caryl Comes - Please let this guy know that his echo is somewhat worse. But not much worse. Would like to try an empiric approach for ischemia, adding IMDUR 30 mg qAM-- lets see how that does and then we can try a diuretic or SGLT 2  Spoke with pt's wife, DPR and advised of echo results and Dr Olin Pia recommendation to start Imdur 30mg  - 1 tablet by mouth qam.  Pt's wife verbalizes understanding and agrees with current plan.  Pt's wife thanked Therapist, sports for the call.

## 2020-08-30 NOTE — Progress Notes (Signed)
Remote ICD transmission.   

## 2020-10-05 ENCOUNTER — Telehealth: Payer: Self-pay

## 2020-10-05 NOTE — Telephone Encounter (Signed)
The patient tried to send a manual transmission but was unsuccessful. I called tech support to get additional help. The rep we received, it was his first day. He asked the patient to unplug it and plug it in. He asked them to go into the app. The patient does not have an app. I gave the patients verbal instructions on how to do the manual transmission. The patient monitor is connected to the phone line. I told the patient I will call them back if we receive the transmission.

## 2020-10-06 NOTE — Progress Notes (Signed)
No ICM remote transmission received for 10/02/2020 and next ICM transmission scheduled for 10/30/2020.

## 2020-10-12 NOTE — Telephone Encounter (Signed)
LMOVM for patient to call tech support to get additional help with his monitor. I also left the device clinic direct number.

## 2020-10-12 NOTE — Telephone Encounter (Signed)
Pt's wife is calling to see if the transmission was received. Please advise pt further

## 2020-10-13 NOTE — Telephone Encounter (Signed)
I spoke with the patient and he states Tech support is sending him a new monitor.

## 2020-10-17 ENCOUNTER — Other Ambulatory Visit: Payer: Self-pay | Admitting: Family Medicine

## 2020-10-17 DIAGNOSIS — I10 Essential (primary) hypertension: Secondary | ICD-10-CM

## 2020-10-23 ENCOUNTER — Ambulatory Visit: Payer: Medicare Other | Admitting: Family Medicine

## 2020-10-26 ENCOUNTER — Encounter: Payer: Self-pay | Admitting: Family Medicine

## 2020-11-02 ENCOUNTER — Telehealth: Payer: Self-pay

## 2020-11-02 NOTE — Telephone Encounter (Signed)
I spoke with [employer] and Lurene Shadow, rep states the patient was ordered a wirex adapter. The adapter will not get to the patient until October.  The patient will not be able to send a transmission until then.

## 2020-11-03 NOTE — Progress Notes (Signed)
No ICM remote transmission received for 10/30/2020 and next ICM transmission scheduled for 12/18/2020.  Waiting on home monitor part and expected delivery is October.

## 2020-11-22 ENCOUNTER — Encounter: Payer: Self-pay | Admitting: Family Medicine

## 2020-11-22 ENCOUNTER — Other Ambulatory Visit: Payer: Self-pay

## 2020-11-22 ENCOUNTER — Ambulatory Visit (INDEPENDENT_AMBULATORY_CARE_PROVIDER_SITE_OTHER): Payer: Medicare Other | Admitting: Family Medicine

## 2020-11-22 VITALS — BP 144/75 | HR 66 | Wt 135.0 lb

## 2020-11-22 DIAGNOSIS — J449 Chronic obstructive pulmonary disease, unspecified: Secondary | ICD-10-CM

## 2020-11-22 DIAGNOSIS — E785 Hyperlipidemia, unspecified: Secondary | ICD-10-CM

## 2020-11-22 DIAGNOSIS — N1831 Chronic kidney disease, stage 3a: Secondary | ICD-10-CM | POA: Diagnosis not present

## 2020-11-22 DIAGNOSIS — I10 Essential (primary) hypertension: Secondary | ICD-10-CM

## 2020-11-22 MED ORDER — FUROSEMIDE 40 MG PO TABS: 40.0000 mg | ORAL_TABLET | Freq: Every day | ORAL | 3 refills | Status: DC

## 2020-11-22 MED ORDER — SPIRONOLACTONE 25 MG PO TABS: 12.5000 mg | ORAL_TABLET | Freq: Every day | ORAL | 3 refills | Status: DC

## 2020-11-22 NOTE — Progress Notes (Signed)
BP (!) 144/75   Pulse 66   Wt 135 lb (61.2 kg)   SpO2 97%   BMI 21.14 kg/m    Subjective:   Patient ID: [name] [name], male    DOB: 12/28/1930, 87 y.o.   MRN: [id number]  HPI: [name] [name] is a 87 y.o. male presenting on 11/22/2020 for Medical Management of Chronic Issues and Hypertension   HPI Hypertension Patient is currently on Imdur and furosemide and spironolactone, and their blood pressure today is 144/75. Patient denies any lightheadedness or dizziness. Patient denies headaches, blurred vision, chest pains, shortness of breath, or weakness. Denies any side effects from medication and is content with current medication.   Hyperlipidemia Patient is coming in for recheck of his hyperlipidemia. The patient is currently taking fish oil. They deny any issues with myalgias or history of liver damage from it. They deny any focal numbness or weakness or chest pain.   COPD Patient is coming in for COPD recheck today.  He is currently on no medication currently.  He has a mild chronic cough but denies any major coughing spells or wheezing spells.  He has 0nighttime symptoms per week and 0daytime symptoms per week currently.   Ckd3  Patient is coming in for recheck for CKD.  He denies any urinary issues.  We will check blood work today.  Patient sees cardiology for CHF and has defibrillator  Relevant past medical, surgical, family and social history reviewed and updated as indicated. Interim medical history since our last visit reviewed. Allergies and medications reviewed and updated.  Review of Systems  Constitutional:  Negative for chills and fever.  Eyes:  Negative for visual disturbance.  Respiratory:  Negative for shortness of breath and wheezing.   Cardiovascular:  Negative for chest pain and leg swelling.  Musculoskeletal:  Negative for back pain and gait problem.  Skin:  Negative for rash.  Neurological:  Negative for dizziness, weakness and light-headedness.  All other  systems reviewed and are negative.  Per HPI unless specifically indicated above   Allergies as of 11/22/2020       Reactions   Bee Pollen    Pollen Extract    Ramipril Hives, Swelling   Crestor [rosuvastatin] Anxiety        Medication List        Accurate as of November 22, 2020  1:37 PM. If you have any questions, ask your nurse or doctor.          aspirin 81 MG tablet Take 81 mg by mouth daily.   Fish Oil 1000 MG Cpdr Take 1 capsule by mouth daily.   furosemide 40 MG tablet Commonly known as: LASIX Take 1 tablet (40 mg total) by mouth daily.   Ginseng 250 MG Caps Take 1 capsule by mouth daily.   isosorbide mononitrate 30 MG 24 hr tablet Commonly known as: IMDUR Take 1 tablet (30 mg total) by mouth in the morning.   multivitamin with minerals tablet Take 1 tablet by mouth daily.   nitroGLYCERIN 0.4 MG SL tablet Commonly known as: NITROSTAT Place 1 tablet (0.4 mg total) under the tongue every 5 (five) minutes as needed for chest pain.   spironolactone 25 MG tablet Commonly known as: ALDACTONE Take 0.5 tablets (12.5 mg total) by mouth daily.   zinc gluconate 50 MG tablet Take 50 mg by mouth daily.         Objective:   BP (!) 144/75   Pulse 66   Wt  135 lb (61.2 kg)   SpO2 97%   BMI 21.14 kg/m   Wt Readings from Last 3 Encounters:  11/22/20 135 lb (61.2 kg)  06/30/20 129 lb (58.5 kg)  06/19/20 129 lb (58.5 kg)    Physical Exam Vitals and nursing note reviewed.  Constitutional:      General: He is not in acute distress.    Appearance: He is well-developed. He is not diaphoretic.  Eyes:     General: No scleral icterus.    Conjunctiva/sclera: Conjunctivae normal.  Neck:     Thyroid: No thyromegaly.  Cardiovascular:     Rate and Rhythm: Normal rate and regular rhythm.     Heart sounds: Normal heart sounds. No murmur heard. Pulmonary:     Effort: Pulmonary effort is normal. No respiratory distress.     Breath sounds: Normal breath sounds.  No wheezing.  Musculoskeletal:        General: Normal range of motion.     Cervical back: Neck supple.  Lymphadenopathy:     Cervical: No cervical adenopathy.  Skin:    General: Skin is warm and dry.     Findings: No rash.  Neurological:     Mental Status: He is alert and oriented to person, place, and time.     Coordination: Coordination normal.  Psychiatric:        Behavior: Behavior normal.      Assessment & Plan:   Problem List Items Addressed This Visit       Cardiovascular and Mediastinum   Essential hypertension   Relevant Medications   spironolactone (ALDACTONE) 25 MG tablet   furosemide (LASIX) 40 MG tablet   Other Relevant Orders   CBC with Differential/Platelet     Respiratory   COPD (chronic obstructive pulmonary disease) (HCC)     Genitourinary   CKD (chronic kidney disease), stage III (HCC)   Relevant Orders   CBC with Differential/Platelet   Lipid panel   CMP14+EGFR     Other   Hyperlipidemia LDL goal <100 - Primary   Relevant Medications   spironolactone (ALDACTONE) 25 MG tablet   furosemide (LASIX) 40 MG tablet   Other Relevant Orders   Lipid panel    Patient seems to be doing well, will check his blood work.  Follow-up in 6 months Follow up plan: Return in about 6 months (around 05/22/2021), or if symptoms worsen or fail to improve, for Hypertension and cholesterol and COPD.  Counseling provided for all of the vaccine components Orders Placed This Encounter  Procedures   CBC with Differential/Platelet   Lipid panel   [place] Jacqueline Spofford, MD [facility] 11/22/2020, 1:37 PM

## 2020-11-23 LAB — CMP14+EGFR
ALT: 7 IU/L (ref 0–44)
AST: 12 IU/L (ref 0–40)
Albumin/Globulin Ratio: 2.2 (ref 1.2–2.2)
Albumin: 4.6 g/dL (ref 3.6–4.6)
Alkaline Phosphatase: 54 IU/L (ref 44–121)
BUN/Creatinine Ratio: 12 (ref 10–24)
BUN: 16 mg/dL (ref 8–27)
Bilirubin Total: 0.5 mg/dL (ref 0.0–1.2)
CO2: 23 mmol/L (ref 20–29)
Calcium: 9.5 mg/dL (ref 8.6–10.2)
Chloride: 100 mmol/L (ref 96–106)
Creatinine, Ser: 1.31 mg/dL — ABNORMAL HIGH (ref 0.76–1.27)
Globulin, Total: 2.1 g/dL (ref 1.5–4.5)
Glucose: 88 mg/dL (ref 65–99)
Potassium: 4.5 mmol/L (ref 3.5–5.2)
Sodium: 138 mmol/L (ref 134–144)
Total Protein: 6.7 g/dL (ref 6.0–8.5)
eGFR: 52 mL/min/{1.73_m2} — ABNORMAL LOW (ref >59)

## 2020-11-23 LAB — CBC WITH DIFFERENTIAL/PLATELET
Basophils Absolute: 0.1 10*3/uL (ref 0.0–0.2)
Basos: 1 %
EOS (ABSOLUTE): 0.2 10*3/uL (ref 0.0–0.4)
Eos: 3 %
Hematocrit: 44.9 % (ref 37.5–51.0)
Hemoglobin: 15.8 g/dL (ref 13.0–17.7)
Immature Grans (Abs): 0 10*3/uL (ref 0.0–0.1)
Immature Granulocytes: 0 %
Lymphocytes Absolute: 3.1 10*3/uL (ref 0.7–3.1)
Lymphs: 33 %
MCH: 31.5 pg (ref 26.6–33.0)
MCHC: 35.2 g/dL (ref 31.5–35.7)
MCV: 90 fL (ref 79–97)
Monocytes Absolute: 0.7 10*3/uL (ref 0.1–0.9)
Monocytes: 7 %
Neutrophils Absolute: 5.3 10*3/uL (ref 1.4–7.0)
Neutrophils: 56 %
Platelets: 169 10*3/uL (ref 150–450)
RBC: 5.01 x10E6/uL (ref 4.14–5.80)
RDW: 12.9 % (ref 11.6–15.4)
WBC: 9.3 10*3/uL (ref 3.4–10.8)

## 2020-11-23 LAB — LIPID PANEL
Chol/HDL Ratio: 5.7 ratio — ABNORMAL HIGH (ref 0.0–5.0)
Cholesterol, Total: 278 mg/dL — ABNORMAL HIGH (ref 100–199)
HDL: 49 mg/dL (ref >39)
LDL Chol Calc (NIH): 208 mg/dL — ABNORMAL HIGH (ref 0–99)
Triglycerides: 115 mg/dL (ref 0–149)
VLDL Cholesterol Cal: 21 mg/dL (ref 5–40)

## 2020-11-28 ENCOUNTER — Ambulatory Visit (INDEPENDENT_AMBULATORY_CARE_PROVIDER_SITE_OTHER): Payer: Medicare Other

## 2020-11-28 VITALS — Ht 67.0 in | Wt 135.0 lb

## 2020-11-28 DIAGNOSIS — Z0000 Encounter for general adult medical examination without abnormal findings: Secondary | ICD-10-CM | POA: Diagnosis not present

## 2020-11-28 NOTE — Patient Instructions (Signed)
[name] , Thank you for taking time to come for your Medicare Wellness Visit. I appreciate your ongoing commitment to your health goals. Please review the following plan we discussed and let me know if I can assist you in the future.   Screening recommendations/referrals: Colonoscopy: No longer due. Recommended yearly ophthalmology/optometry visit for glaucoma screening and checkup Recommended yearly dental visit for hygiene and checkup  Vaccinations: Influenza vaccine: Done 01/20/2020, due in fall.  Pneumococcal vaccine: 04/25/20. Second dose due. Tdap vaccine: Repeat in 10 years  Shingles vaccine: Shingrix discussed. Please contact your pharmacy for coverage information.     Covid-19: Done 09/23/19 08/26/19 08/29/20  Advanced directives: Advance directive discussed with you today. Even though you declined this today, please call our office should you change your mind, and we can give you the proper paperwork for you to fill out.   Conditions/risks identified: Aim for 30 minutes of exercise or brisk walking each day, drink 6-8 glasses of water and eat lots of fruits and vegetables. Make sure to remove any throw rugs, electrical cords, etc, that may cause you to trip and fall.   Next appointment: Follow up in one year for your annual wellness visit.  Preventive Care 85 Years and Older, Male  Preventive care refers to lifestyle choices and visits with your health care provider that can promote health and wellness. What does preventive care include? A yearly physical exam. This is also called an annual well check. Dental exams once or twice a year. Routine eye exams. Ask your health care provider how often you should have your eyes checked. Personal lifestyle choices, including: Daily care of your teeth and gums. Regular physical activity. Eating a healthy diet. Avoiding tobacco and drug use. Limiting alcohol use. Practicing safe sex. Taking low doses of aspirin every day. Taking  vitamin and mineral supplements as recommended by your health care provider. What happens during an annual well check? The services and screenings done by your health care provider during your annual well check will depend on your age, overall health, lifestyle risk factors, and family history of disease. Counseling  Your health care provider may ask you questions about your: Alcohol use. Tobacco use. Drug use. Emotional well-being. Home and relationship well-being. Sexual activity. Eating habits. History of falls. Memory and ability to understand (cognition). Work and work Statistician. Screening  You may have the following tests or measurements: Height, weight, and BMI. Blood pressure. Lipid and cholesterol levels. These may be checked every 5 years, or more frequently if you are over 64 years old. Skin check. Lung cancer screening. You may have this screening every year starting at age 49 if you have a 30-pack-year history of smoking and currently smoke or have quit within the past 15 years. Fecal occult blood test (FOBT) of the stool. You may have this test every year starting at age 71. Flexible sigmoidoscopy or colonoscopy. You may have a sigmoidoscopy every 5 years or a colonoscopy every 10 years starting at age 36. Prostate cancer screening. Recommendations will vary depending on your family history and other risks. Hepatitis C blood test. Hepatitis B blood test. Sexually transmitted disease (STD) testing. Diabetes screening. This is done by checking your blood sugar (glucose) after you have not eaten for a while (fasting). You may have this done every 1-3 years. Abdominal aortic aneurysm (AAA) screening. You may need this if you are a current or former smoker. Osteoporosis. You may be screened starting at age 40 if you are at  high risk. Talk with your health care provider about your test results, treatment options, and if necessary, the need for more tests. Vaccines  Your  health care provider may recommend certain vaccines, such as: Influenza vaccine. This is recommended every year. Tetanus, diphtheria, and acellular pertussis (Tdap, Td) vaccine. You may need a Td booster every 10 years. Zoster vaccine. You may need this after age 51. Pneumococcal 13-valent conjugate (PCV13) vaccine. One dose is recommended after age 73. Pneumococcal polysaccharide (PPSV23) vaccine. One dose is recommended after age 47. Talk to your health care provider about which screenings and vaccines you need and how often you need them. This information is not intended to replace advice given to you by your health care provider. Make sure you discuss any questions you have with your health care provider. Document Released: 04/07/2015 Document Revised: 11/29/2015 Document Reviewed: 01/10/2015 Elsevier Interactive Patient Education  2017 [employer] Prevention in the Home Falls can cause injuries. They can happen to people of all ages. There are many things you can do to make your home safe and to help prevent falls. What can I do on the outside of my home? Regularly fix the edges of walkways and driveways and fix any cracks. Remove anything that might make you trip as you walk through a door, such as a raised step or threshold. Trim any bushes or trees on the path to your home. Use bright outdoor lighting. Clear any walking paths of anything that might make someone trip, such as rocks or tools. Regularly check to see if handrails are loose or broken. Make sure that both sides of any steps have handrails. Any raised decks and porches should have guardrails on the edges. Have any leaves, snow, or ice cleared regularly. Use sand or salt on walking paths during winter. Clean up any spills in your garage right away. This includes oil or grease spills. What can I do in the bathroom? Use night lights. Install grab bars by the toilet and in the tub and shower. Do not use towel bars as  grab bars. Use non-skid mats or decals in the tub or shower. If you need to sit down in the shower, use a plastic, non-slip stool. Keep the floor dry. Clean up any water that spills on the floor as soon as it happens. Remove soap buildup in the tub or shower regularly. Attach bath mats securely with double-sided non-slip rug tape. Do not have throw rugs and other things on the floor that can make you trip. What can I do in the bedroom? Use night lights. Make sure that you have a light by your bed that is easy to reach. Do not use any sheets or blankets that are too big for your bed. They should not hang down onto the floor. Have a firm chair that has side arms. You can use this for support while you get dressed. Do not have throw rugs and other things on the floor that can make you trip. What can I do in the kitchen? Clean up any spills right away. Avoid walking on wet floors. Keep items that you use a lot in easy-to-reach places. If you need to reach something above you, use a strong step stool that has a grab bar. Keep electrical cords out of the way. Do not use floor polish or wax that makes floors slippery. If you must use wax, use non-skid floor wax. Do not have throw rugs and other things on the floor that  can make you trip. What can I do with my stairs? Do not leave any items on the stairs. Make sure that there are handrails on both sides of the stairs and use them. Fix handrails that are broken or loose. Make sure that handrails are as long as the stairways. Check any carpeting to make sure that it is firmly attached to the stairs. Fix any carpet that is loose or worn. Avoid having throw rugs at the top or bottom of the stairs. If you do have throw rugs, attach them to the floor with carpet tape. Make sure that you have a light switch at the top of the stairs and the bottom of the stairs. If you do not have them, ask someone to add them for you. What else can I do to help prevent  falls? Wear shoes that: Do not have high heels. Have rubber bottoms. Are comfortable and fit you well. Are closed at the toe. Do not wear sandals. If you use a stepladder: Make sure that it is fully opened. Do not climb a closed stepladder. Make sure that both sides of the stepladder are locked into place. Ask someone to hold it for you, if possible. Clearly mark and make sure that you can see: Any grab bars or handrails. First and last steps. Where the edge of each step is. Use tools that help you move around (mobility aids) if they are needed. These include: Canes. Walkers. Scooters.  Crutches. Turn on the lights when you go into a dark area. Replace any light bulbs as soon as they burn out. Set up your furniture so you have a clear path. Avoid moving your furniture around. If any of your floors are uneven, fix them. If there are any pets around you, be aware of where they are. Review your medicines with your doctor. Some medicines can make you feel dizzy. This can increase your chance of falling. Ask your doctor what other things that you can do to help prevent falls. This information is not intended to replace advice given to you by your health care provider. Make sure you discuss any questions you have with your health care provider. Document Released: 01/05/2009 Document Revised: 08/17/2015 Document Reviewed: 04/15/2014 Elsevier Interactive Patient Education  2017 [employer].

## 2020-11-28 NOTE — Progress Notes (Signed)
Subjective:   [name] [name] is a 87 y.o. male who presents for Medicare Annual/Subsequent preventive examination. Virtual Visit via Telephone Note  I connected with  [name] [name] on 11/28/20 at  2:45 PM EDT by telephone and verified that I am speaking with the correct person using two identifiers.  Location: Patient: Home Provider: [facility] Persons participating in the virtual visit: patient/Nurse Health Advisor   I discussed the limitations, risks, security and privacy concerns of performing an evaluation and management service by telephone and the availability of in person appointments. The patient expressed understanding and agreed to proceed.  Interactive audio and video telecommunications were attempted between this nurse and patient, however failed, due to patient having technical difficulties OR patient did not have access to video capability.  We continued and completed visit with audio only.  Some vital signs may be absent or patient reported.   Chriss Driver, LPN  Review of Systems     Cardiac Risk Factors include: advanced age (>51mn, >>21women);dyslipidemia;hypertension;male gender;sedentary lifestyle     Objective:    Today's Vitals   11/28/20 1438  Weight: 135 lb (61.2 kg)  Height: '5\' 7"'$  (1.702 m)   Body mass index is 21.14 kg/m.  Advanced Directives 11/28/2020 06/01/2019 10/29/2017 12/13/2013  Does Patient Have a Medical Advance Directive? No No No No  Does patient want to make changes to medical advance directive? - No - Patient declined - -  Would patient like information on creating a medical advance directive? No - Patient declined - Yes (MAU/Ambulatory/Procedural Areas - Information given) No - patient declined information    Current Medications (verified) Outpatient Encounter Medications as of 11/28/2020  Medication Sig   aspirin 81 MG tablet Take 81 mg by mouth daily.   furosemide (LASIX) 40 MG tablet Take 1 tablet (40 mg total) by mouth daily.    Ginseng 250 MG CAPS Take 1 capsule by mouth daily.   isosorbide mononitrate (IMDUR) 30 MG 24 hr tablet Take 1 tablet (30 mg total) by mouth in the morning.   Multiple Vitamins-Minerals (MULTIVITAMIN WITH MINERALS) tablet Take 1 tablet by mouth daily.   nitroGLYCERIN (NITROSTAT) 0.4 MG SL tablet Place 1 tablet (0.4 mg total) under the tongue every 5 (five) minutes as needed for chest pain.   Omega-3 Fatty Acids (FISH OIL) 1000 MG CPDR Take 1 capsule by mouth daily.    spironolactone (ALDACTONE) 25 MG tablet Take 0.5 tablets (12.5 mg total) by mouth daily.   zinc gluconate 50 MG tablet Take 50 mg by mouth daily.   No facility-administered encounter medications on file as of 11/28/2020.    Allergies (verified) Bee pollen, Pollen extract, Ramipril, and Crestor [rosuvastatin]   History: Past Medical History:  Diagnosis Date   CAD (coronary artery disease)    status post coronary bypass graft surgery in 1989 with documented occulsion of the vein graft to the diagonal branch of the left anterior descending in 2001.   COPD (chronic obstructive pulmonary disease) (HCC)    Hyperlipidemia    Hypertension    Ischemic cardiomyopathy    ejection fraction of 30% improved [id number]  Systolic congestive heart failure (HCC)    Class III systolic congestive heart, improved to class II, euvolemic.   Past Surgical History:  Procedure Laterality Date   BI-VENTRICULAR IMPLANTABLE CARDIOVERTER DEFIBRILLATOR  (CRT-D)  2005; 2015   STJ CRTD implanted by Dr KCaryl Comes  BI-VENTRICULAR IMPLANTABLE CARDIOVERTER DEFIBRILLATOR UPGRADE N/A 12/13/2013   Procedure: BI-VENTRICULAR  IMPLANTABLE CARDIOVERTER DEFIBRILLATOR UPGRADE;  Surgeon: [name], MD;  Location: [facility] CATH LAB;  Service: Cardiovascular;  Laterality: N/A;   CORONARY ARTERY BYPASS GRAFT  1989   Family History  Problem Relation Age of Onset   Heart failure Mother    Social History   Socioeconomic History   Marital status: Married    Spouse name:  Not on file   Number of children: 5   Years of education: Not on file   Highest education level: Not on file  Occupational History    Employer: RETIRED  Tobacco Use   Smoking status: Former    Packs/day: 1.00    Years: 43.00    Pack years: 43.00    Types: Cigarettes    Quit date: 12/07/1987    Years since quitting: 33.0   Smokeless tobacco: Never  Vaping Use   Vaping Use: Never used  Substance and Sexual Activity   Alcohol use: No   Drug use: No   Sexual activity: Not on file  Other Topics Concern   Not on file  Social History Narrative   Lives with wife, 3rd marriage. Married x 13 years.    3 sons 2 daughters. All live close by.      Completed 10th grade      Social Determinants of Health   Financial Resource Strain: Low Risk    Difficulty of Paying Living Expenses: Not hard at all  Food Insecurity: No Food Insecurity   Worried About Charity fundraiser in the Last Year: Never true   Ran Out of Food in the Last Year: Never true  Transportation Needs: No Transportation Needs   Lack of Transportation (Medical): No   Lack of Transportation (Non-Medical): No  Physical Activity: Sufficiently Active   Days of Exercise per Week: 7 days   Minutes of Exercise per Session: 30 min  Stress: No Stress Concern Present   Feeling of Stress : Not at all  Social Connections: Socially Integrated   Frequency of Communication with Friends and Family: More than three times a week   Frequency of Social Gatherings with Friends and Family: More than three times a week   Attends Religious Services: More than 4 times per year   Active Member of [employer] or Organizations: Yes   Attends Music therapist: More than 4 times per year   Marital Status: Married    Tobacco Counseling Counseling given: Not Answered   Clinical Intake:  Pre-visit preparation completed: Yes  Pain : No/denies pain     BMI - recorded: 21.14 Nutritional Status: BMI of 19-24  Normal Nutritional  Risks: None Diabetes: No  How often do you need to have someone help you when you read instructions, pamphlets, or other written materials from your doctor or pharmacy?: 1 - Never  Diabetic?NO  Interpreter Needed?: No  Information entered by :: Randal Buba, LPN   Activities of Daily Living In your present state of health, do you have any difficulty performing the following activities: 11/28/2020  Hearing? N  Vision? N  Difficulty concentrating or making decisions? N  Walking or climbing stairs? N  Dressing or bathing? N  Doing errands, shopping? N  Preparing Food and eating ? N  Using the Toilet? N  In the past six months, have you accidently leaked urine? N  Do you have problems with loss of bowel control? N  Managing your Medications? N  Managing your Finances? N  Housekeeping or managing your Housekeeping? N  Some recent data might be hidden    Patient Care Team: Dettinger, Fransisca Kaufmann, MD as PCP - General (Family Medicine)  Indicate any recent Medical Services you may have received from other than [facility] providers in the past year (date may be approximate).     Assessment:   This is a routine wellness examination for [name] [name].  Hearing/Vision screen Hearing Screening - Comments:: No issues hearing. Vision Screening - Comments:: Glasses. Up to date on eye exam. Dr. Sammuel Bailiff office.   Dietary issues and exercise activities discussed: Current Exercise Habits: Home exercise routine, Type of exercise: walking;Other - see comments (Stationary bike), Time (Minutes): 30, Frequency (Times/Week): 7, Weekly Exercise (Minutes/Week): 210, Intensity: Mild, Exercise limited by: cardiac condition(s)   Goals Addressed             This Visit's Progress    DIET - INCREASE WATER INTAKE   On track    [place]   On track      Depression Screen PHQ 2/9 Scores 11/28/2020 11/22/2020 04/25/2020 09/23/2019 06/01/2019 04/12/2019 12/11/2018  PHQ - 2 Score 0 0 0 0 0 0 0  PHQ-  9 Score - - - - - - -    Fall Risk Fall Risk  11/28/2020 11/22/2020 04/25/2020 09/23/2019 06/01/2019  Falls in the past year? 1 0 0 0 0  Number falls in past yr: 0 - - - -  Injury with Fall? 0 - - - -  Risk for fall due to : Impaired vision - - - -  Follow up Falls prevention discussed - - - -    FALL RISK PREVENTION PERTAINING TO THE HOME:  Any stairs in or around the home? No  If so, are there any without handrails? No  Home free of loose throw rugs in walkways, pet beds, electrical cords, etc? No . Pt states he did slip on a throw rug. Advised to remove.  Adequate lighting in your home to reduce risk of falls? Yes   ASSISTIVE DEVICES UTILIZED TO PREVENT FALLS:  Life alert? No  Use of a cane, walker or w/c? No  Grab bars in the bathroom? No  Shower chair or bench in shower? Yes  Elevated toilet seat or a handicapped toilet? No   TIMED UP AND GO:  Was the test performed? No . Phone vist.  Cognitive Function: Normal cognitive status assessed by direct observation by this Nurse Health Advisor. No abnormalities found.   MMSE - Mini Mental State Exam 10/29/2017  Orientation to time 4  Orientation to Place 5  Registration 3  Attention/ Calculation 5  Recall 2  Language- name 2 objects 2  Language- repeat 1  Language- follow 3 step command 3  Language- read & follow direction 1  Write a sentence 1  Copy design 1  Total score 28     6CIT Screen 06/01/2019  What Year? 0 points  What month? 0 points  What time? 0 points  Count back from 20 0 points  Months in reverse 0 points  Repeat phrase 8 points  Total Score 8    Immunizations Immunization History  Administered Date(s) Administered   Fluad Quad(high Dose 65+) 11/26/2018, 01/20/2020   Influenza Inj Mdck Quad Pf 11/26/2018   Influenza, High Dose Seasonal PF 01/08/2017, 01/09/2018   Influenza,inj,Quad PF,6+ Mos 12/26/2015   Moderna SARS-COV2 Booster Vaccination 08/29/2020   Moderna Sars-Covid-2 Vaccination 08/26/2019,  09/23/2019   Pneumococcal Conjugate-13 04/25/2020    TDAP status: Due, Education has  been provided regarding the importance of this vaccine. Advised may receive this vaccine at local pharmacy or Health Dept. Aware to provide a copy of the vaccination record if obtained from local pharmacy or Health Dept. Verbalized acceptance and understanding.  Flu Vaccine status: Due, Education has been provided regarding the importance of this vaccine. Advised may receive this vaccine at local pharmacy or Health Dept. Aware to provide a copy of the vaccination record if obtained from local pharmacy or Health Dept. Verbalized acceptance and understanding.  Pneumococcal vaccine status: Due, Education has been provided regarding the importance of this vaccine. Advised may receive this vaccine at local pharmacy or Health Dept. Aware to provide a copy of the vaccination record if obtained from local pharmacy or Health Dept. Verbalized acceptance and understanding.  Covid-19 vaccine status: Completed vaccines  Qualifies for Shingles Vaccine? Yes   Zostavax completed No   Shingrix Completed?: No.    Education has been provided regarding the importance of this vaccine. Patient has been advised to call insurance company to determine out of pocket expense if they have not yet received this vaccine. Advised may also receive vaccine at local pharmacy or Health Dept. Verbalized acceptance and understanding.  Screening Tests Health Maintenance  Topic Date Due   INFLUENZA VACCINE  10/23/2020   TETANUS/TDAP  03/26/2021 (Originally 04/29/1949)   Zoster Vaccines- Shingrix (1 of 2) 03/26/2021 (Originally 04/28/1980)   COVID-19 Vaccine (4 - Booster for Moderna series) 12/29/2020   PNA vac Low Risk Adult (2 of 2 - PPSV23) 04/25/2021   HPV VACCINES  Aged Out    Health Maintenance  Health Maintenance Due  Topic Date Due   INFLUENZA VACCINE  10/23/2020    Colorectal cancer screening: No longer required.   Lung Cancer  Screening: (Low Dose CT Chest recommended if Age 79-80 years, 30 pack-year currently smoking OR have quit w/in 15years.) does not qualify.    Additional Screening:  Hepatitis C Screening: does qualify  Vision Screening: Recommended annual ophthalmology exams for early detection of glaucoma and other disorders of the eye. Is the patient up to date with their annual eye exam?  Yes  Who is the provider or what is the name of the office in which the patient attends annual eye exams? Pt states his eyes are examined at Dr. Arvin Collard office every 3 months.  If pt is not established with a provider, would they like to be referred to a provider to establish care? No .   Dental Screening: Recommended annual dental exams for proper oral hygiene  Community Resource Referral / Chronic Care Management: CRR required this visit?  No   CCM required this visit?  No      Plan:     I have personally reviewed and noted the following in the patient's chart:   Medical and social history Use of alcohol, tobacco or illicit drugs  Current medications and supplements including opioid prescriptions. Patient is not currently taking opioid prescriptions. Functional ability and status Nutritional status Physical activity Advanced directives List of other physicians Hospitalizations, surgeries, and ER visits in previous 12 months Vitals Screenings to include cognitive, depression, and falls Referrals and appointments  In addition, I have reviewed and discussed with patient certain preventive protocols, quality metrics, and best practice recommendations. A written personalized care plan for preventive services as well as general preventive health recommendations were provided to patient.     Chriss Driver, LPN   [id number]   Nurse Notes: None

## 2020-12-18 NOTE — Progress Notes (Signed)
Waiting on Carelink adapter and expected to be delivered in October.  ICM Remote Transmission rescheduled for 01/15/2021.

## 2020-12-27 ENCOUNTER — Telehealth: Payer: Self-pay

## 2020-12-27 NOTE — Telephone Encounter (Signed)
The patient been feeling bad. All he wants to do is sleep. Patient been more tired than usual. [employer] is sending him a new monitor in 7-10 business days. I told the patient wife that the nurse will give them a call back. [phone].

## 2020-12-27 NOTE — Telephone Encounter (Signed)
Returning phone call.  Patient reports he has felt well and does not have any concerns. Patient was eating lunch when I called and sounded well.  Spoke to patients wife and expresses how she is concerned his device will not work without the remote monitor. Advised his device will work without it that the monitor is a way to communicate his device to the clinic remotely. Voiced understanding. Advised if any further questions or concerns to please call back. Appreciative of follow up call.

## 2021-01-01 NOTE — Telephone Encounter (Signed)
Monitor shipped 12/28/2020.

## 2021-01-04 ENCOUNTER — Ambulatory Visit (INDEPENDENT_AMBULATORY_CARE_PROVIDER_SITE_OTHER): Payer: Medicare Other

## 2021-01-04 ENCOUNTER — Other Ambulatory Visit: Payer: Self-pay

## 2021-01-04 DIAGNOSIS — Z23 Encounter for immunization: Secondary | ICD-10-CM | POA: Diagnosis not present

## 2021-01-04 NOTE — Telephone Encounter (Signed)
I tried to call the patient to see if he received his monitor but the call did not go through.

## 2021-01-08 NOTE — Telephone Encounter (Signed)
Patient called back and states he has gotten the new monitor in the mail and it has been plugged up. Patient tried to send a transmission and got a [id number] code. Patient was given the tech support number to call as he wasn't able to call at the moment and he will call back once he gets a hold of them

## 2021-01-08 NOTE — Telephone Encounter (Signed)
Left message for patient to give us a call back.  

## 2021-01-09 ENCOUNTER — Ambulatory Visit (INDEPENDENT_AMBULATORY_CARE_PROVIDER_SITE_OTHER): Payer: Medicare Other

## 2021-01-09 DIAGNOSIS — I429 Cardiomyopathy, unspecified: Secondary | ICD-10-CM | POA: Diagnosis not present

## 2021-01-09 NOTE — Telephone Encounter (Signed)
Transmission received.

## 2021-01-11 LAB — CUP PACEART REMOTE DEVICE CHECK
Battery Remaining Longevity: 16 mo
Battery Voltage: 2.89 V
Brady Statistic AP VP Percent: 32.57 %
Brady Statistic AP VS Percent: 0.02 %
Brady Statistic AS VP Percent: 67.1 %
Brady Statistic AS VS Percent: 0.31 %
Brady Statistic RA Percent Paced: 31.28 %
Brady Statistic RV Percent Paced: 95.79 %
Date Time Interrogation Session: 20221018095551
HighPow Impedance: 46 Ohm
HighPow Impedance: 73 Ohm
Implantable Lead Implant Date: 20010823
Implantable Lead Implant Date: 20010823
Implantable Lead Implant Date: 20091119
Implantable Lead Location: 753858
Implantable Lead Location: 753859
Implantable Lead Location: 753860
Implantable Lead Model: 148
Implantable Lead Model: 6940
Implantable Lead Serial Number: 110961
Implantable Pulse Generator Implant Date: 20150921
Lead Channel Impedance Value: 1045 Ohm
Lead Channel Impedance Value: 323 Ohm
Lead Channel Impedance Value: 456 Ohm
Lead Channel Impedance Value: 494 Ohm
Lead Channel Impedance Value: 513 Ohm
Lead Channel Impedance Value: 646 Ohm
Lead Channel Pacing Threshold Amplitude: 0.75 V
Lead Channel Pacing Threshold Amplitude: 0.75 V
Lead Channel Pacing Threshold Amplitude: 2.5 V
Lead Channel Pacing Threshold Pulse Width: 0.4 ms
Lead Channel Pacing Threshold Pulse Width: 0.4 ms
Lead Channel Pacing Threshold Pulse Width: 0.4 ms
Lead Channel Sensing Intrinsic Amplitude: 1.25 mV
Lead Channel Sensing Intrinsic Amplitude: 1.25 mV
Lead Channel Sensing Intrinsic Amplitude: 3.625 mV
Lead Channel Sensing Intrinsic Amplitude: 3.625 mV
Lead Channel Setting Pacing Amplitude: 0.5 V
Lead Channel Setting Pacing Amplitude: 1.75 V
Lead Channel Setting Pacing Amplitude: 2.5 V
Lead Channel Setting Pacing Pulse Width: 0.03 ms
Lead Channel Setting Pacing Pulse Width: 0.4 ms
Lead Channel Setting Sensing Sensitivity: 0.3 mV

## 2021-01-12 ENCOUNTER — Ambulatory Visit (INDEPENDENT_AMBULATORY_CARE_PROVIDER_SITE_OTHER): Payer: Medicare Other

## 2021-01-12 DIAGNOSIS — I5022 Chronic systolic (congestive) heart failure: Secondary | ICD-10-CM

## 2021-01-12 DIAGNOSIS — Z95810 Presence of automatic (implantable) cardiac defibrillator: Secondary | ICD-10-CM | POA: Diagnosis not present

## 2021-01-16 NOTE — Progress Notes (Signed)
EPIC Encounter for ICM Monitoring  Patient Name: [name] [name] is a 87 y.o. male Date: 01/16/2021 Primary Care Physican: Dettinger, Fransisca Kaufmann, MD Primary Cardiologist: Caryl Comes Electrophysiologist: Vergie Living Pacing:  95.8%    01/16/2021 Weight: 140 lb   Time in AT/AF  0.0 hr/day (0.0%)   Spoke with patient and wife, [name] per [facility] and heart failure questions reviewed.  Pt asymptomatic for fluid accumulation and feeling well.   Optivol Thoracic impedance suggesting normal levels.   Prescribed: Furosemide 40 mg 1 tablet daily   Labs: 05/15/2020 Creatinine 1.62, BUN 24, Potassium 4.3, Sodium 139, GFR 43 A complete set of results can be found in Results Review.   Recommendations:  No changes and encouraged to call if experiencing any fluid symptoms.   Follow-up plan: ICM clinic phone appointment on 02/26/2021.  91 day device clinic remote transmission 04/10/2021.       EP/Cardiology Office Visits: Recall 06/14/2021 with Dr. Caryl Comes.     Copy of ICM check sent to Dr. Caryl Comes.    3 month ICM trend: 01/12/2021.    1 Year ICM trend:       [name], RN 01/16/2021 4:30 PM

## 2021-01-18 NOTE — Progress Notes (Signed)
Remote ICD transmission.   

## 2021-02-26 ENCOUNTER — Ambulatory Visit (INDEPENDENT_AMBULATORY_CARE_PROVIDER_SITE_OTHER): Payer: Medicare Other

## 2021-02-26 DIAGNOSIS — I5022 Chronic systolic (congestive) heart failure: Secondary | ICD-10-CM | POA: Diagnosis not present

## 2021-02-26 DIAGNOSIS — Z95810 Presence of automatic (implantable) cardiac defibrillator: Secondary | ICD-10-CM

## 2021-02-28 NOTE — Progress Notes (Signed)
EPIC Encounter for ICM Monitoring  Patient Name: [name] [name] is a 87 y.o. male Date: 02/28/2021 Primary Care Physican: Dettinger, Fransisca Kaufmann, MD Primary Cardiologist: Caryl Comes Electrophysiologist: Vergie Living Pacing:  94.5%    01/16/2021 Weight: 140 lb   Time in AT/AF  0.0 hr/day (0.0%)   Spoke with patient and heart failure questions reviewed.  Pt asymptomatic for fluid accumulation and feeling well.   Optivol Thoracic impedance suggesting normal levels.   Prescribed: Furosemide 40 mg 1 tablet daily   Labs: 11/22/2021 Creatinine 1.31, BUN 16, Potassium 4.5, Sodium 138, GFR 52 05/15/2020 Creatinine 1.62, BUN 24, Potassium 4.3, Sodium 139, GFR 43 A complete set of results can be found in Results Review.   Recommendations:  No changes and encouraged to call if experiencing any fluid symptoms.   Follow-up plan: ICM clinic phone appointment on 04/02/2021.  91 day device clinic remote transmission 04/10/2021.       EP/Cardiology Office Visits: Recall 06/14/2021 with Dr. Caryl Comes.     Copy of ICM check sent to Dr. Caryl Comes.     3 month ICM trend: 02/26/2021.    12-14 Month ICM trend:       [name], RN 02/28/2021 10:06 AM

## 2021-04-02 ENCOUNTER — Ambulatory Visit (INDEPENDENT_AMBULATORY_CARE_PROVIDER_SITE_OTHER): Payer: Medicare Other

## 2021-04-02 DIAGNOSIS — I5022 Chronic systolic (congestive) heart failure: Secondary | ICD-10-CM | POA: Diagnosis not present

## 2021-04-02 DIAGNOSIS — Z95810 Presence of automatic (implantable) cardiac defibrillator: Secondary | ICD-10-CM | POA: Diagnosis not present

## 2021-04-04 NOTE — Progress Notes (Signed)
EPIC Encounter for ICM Monitoring  Patient Name: [name] [name] is a 88 y.o. male Date: 04/04/2021 Primary Care Physican: Dettinger, Fransisca Kaufmann, MD Primary Cardiologist: Caryl Comes Electrophysiologist: Vergie Living Pacing:  95.1%    04/04/2021 Weight: 140 lb   Time in AT/AF  0.0 hr/day (0.0%)   Spoke with patient and heart failure questions reviewed.  Pt asymptomatic for fluid accumulation and feeling well.  No fluid symptoms during decreased impedance in December.   Optivol Thoracic impedance suggesting normal levels.   Prescribed: Furosemide 40 mg 1 tablet daily   Labs: 11/22/2020 Creatinine 1.31, BUN 16, Potassium 4.5, Sodium 138, GFR 52 05/15/2020 Creatinine 1.62, BUN 24, Potassium 4.3, Sodium 139, GFR 43 A complete set of results can be found in Results Review.   Recommendations:  No changes and encouraged to call if experiencing any fluid symptoms.   Follow-up plan: ICM clinic phone appointment on 05/07/2021.  91 day device clinic remote transmission 04/10/2021.       EP/Cardiology Office Visits: Recall 06/14/2021 with Dr. Caryl Comes.     Copy of ICM check sent to Dr. Caryl Comes.    3 month ICM trend: 04/02/2021.    12-14 Month ICM trend:     [name], RN 04/04/2021 4:43 PM

## 2021-04-10 ENCOUNTER — Ambulatory Visit (INDEPENDENT_AMBULATORY_CARE_PROVIDER_SITE_OTHER): Payer: Medicare Other

## 2021-04-10 DIAGNOSIS — I255 Ischemic cardiomyopathy: Secondary | ICD-10-CM | POA: Diagnosis not present

## 2021-04-10 LAB — CUP PACEART REMOTE DEVICE CHECK
Battery Remaining Longevity: 14 mo
Battery Voltage: 2.88 V
Brady Statistic AP VP Percent: 26.47 %
Brady Statistic AP VS Percent: 0.02 %
Brady Statistic AS VP Percent: 73.31 %
Brady Statistic AS VS Percent: 0.21 %
Brady Statistic RA Percent Paced: 25.32 %
Brady Statistic RV Percent Paced: 95.56 %
Date Time Interrogation Session: 20230117022824
HighPow Impedance: 50 Ohm
HighPow Impedance: 74 Ohm
Implantable Lead Implant Date: 20010823
Implantable Lead Implant Date: 20010823
Implantable Lead Implant Date: 20091119
Implantable Lead Location: 753858
Implantable Lead Location: 753859
Implantable Lead Location: 753860
Implantable Lead Model: 148
Implantable Lead Model: 6940
Implantable Lead Serial Number: 110961
Implantable Pulse Generator Implant Date: 20150921
Lead Channel Impedance Value: 1026 Ohm
Lead Channel Impedance Value: 323 Ohm
Lead Channel Impedance Value: 456 Ohm
Lead Channel Impedance Value: 494 Ohm
Lead Channel Impedance Value: 513 Ohm
Lead Channel Impedance Value: 665 Ohm
Lead Channel Pacing Threshold Amplitude: 0.625 V
Lead Channel Pacing Threshold Amplitude: 0.75 V
Lead Channel Pacing Threshold Amplitude: 2.5 V
Lead Channel Pacing Threshold Pulse Width: 0.4 ms
Lead Channel Pacing Threshold Pulse Width: 0.4 ms
Lead Channel Pacing Threshold Pulse Width: 0.4 ms
Lead Channel Sensing Intrinsic Amplitude: 1.875 mV
Lead Channel Sensing Intrinsic Amplitude: 1.875 mV
Lead Channel Sensing Intrinsic Amplitude: 4.5 mV
Lead Channel Sensing Intrinsic Amplitude: 4.5 mV
Lead Channel Setting Pacing Amplitude: 0.5 V
Lead Channel Setting Pacing Amplitude: 1.75 V
Lead Channel Setting Pacing Amplitude: 2.5 V
Lead Channel Setting Pacing Pulse Width: 0.03 ms
Lead Channel Setting Pacing Pulse Width: 0.4 ms
Lead Channel Setting Sensing Sensitivity: 0.3 mV

## 2021-04-23 NOTE — Progress Notes (Signed)
Remote ICD transmission.   

## 2021-05-07 ENCOUNTER — Ambulatory Visit (INDEPENDENT_AMBULATORY_CARE_PROVIDER_SITE_OTHER): Payer: Medicare Other

## 2021-05-07 DIAGNOSIS — Z95810 Presence of automatic (implantable) cardiac defibrillator: Secondary | ICD-10-CM

## 2021-05-07 DIAGNOSIS — I5022 Chronic systolic (congestive) heart failure: Secondary | ICD-10-CM | POA: Diagnosis not present

## 2021-05-10 ENCOUNTER — Telehealth: Payer: Self-pay

## 2021-05-10 NOTE — Telephone Encounter (Signed)
Remote ICM transmission received.  Attempted call to patient regarding ICM remote transmission and left detailed message per DPR.  Advised to return call for any fluid symptoms or questions. Next ICM remote transmission scheduled 06/11/2021.   ° ° °

## 2021-05-10 NOTE — Progress Notes (Signed)
EPIC Encounter for ICM Monitoring  Patient Name: [name] [name] is a 88 y.o. male Date: 05/10/2021 Primary Care Physican: Dettinger, Fransisca Kaufmann, MD Primary Cardiologist: Caryl Comes Electrophysiologist: Vergie Living Pacing:  93.8%    04/04/2021 Weight: 140 lb   Time in AT/AF  0.0 hr/day (0.0%)   Attempted call to patient and unable to reach.  Left detailed message per DPR regarding transmission. Transmission reviewed.    Optivol Thoracic impedance trending close to baseline normal but was suggesting possible fluid accumulation from 1/26-2/8.   Prescribed:  Furosemide 40 mg 1 tablet by mouth daily Spironolactone 25 mg take 0.5 tablet (12.5 mg total) by mouth daily   Labs: 11/22/2020 Creatinine 1.31, BUN 16, Potassium 4.5, Sodium 138, GFR 52 05/15/2020 Creatinine 1.62, BUN 24, Potassium 4.3, Sodium 139, GFR 43 A complete set of results can be found in Results Review.   Recommendations:  Left voice mail with ICM number and encouraged to call if experiencing any fluid symptoms.   Follow-up plan: ICM clinic phone appointment on 06/11/2021.  91 day device clinic remote transmission 07/10/2021.       EP/Cardiology Office Visits: Recall 06/14/2021 with Dr. Caryl Comes.     Copy of ICM check sent to Dr. Caryl Comes.     3 month ICM trend: 05/07/2021.    12-14 Month ICM trend:     [name], RN 05/10/2021 9:25 AM

## 2021-05-21 ENCOUNTER — Ambulatory Visit (INDEPENDENT_AMBULATORY_CARE_PROVIDER_SITE_OTHER): Payer: Medicare Other | Admitting: Family Medicine

## 2021-05-21 ENCOUNTER — Encounter: Payer: Self-pay | Admitting: Family Medicine

## 2021-05-21 VITALS — BP 133/66 | HR 65 | Ht 67.0 in | Wt 137.0 lb

## 2021-05-21 DIAGNOSIS — J449 Chronic obstructive pulmonary disease, unspecified: Secondary | ICD-10-CM | POA: Diagnosis not present

## 2021-05-21 DIAGNOSIS — I5022 Chronic systolic (congestive) heart failure: Secondary | ICD-10-CM

## 2021-05-21 DIAGNOSIS — N1831 Chronic kidney disease, stage 3a: Secondary | ICD-10-CM | POA: Diagnosis not present

## 2021-05-21 DIAGNOSIS — Z23 Encounter for immunization: Secondary | ICD-10-CM

## 2021-05-21 DIAGNOSIS — I10 Essential (primary) hypertension: Secondary | ICD-10-CM | POA: Diagnosis not present

## 2021-05-21 DIAGNOSIS — E785 Hyperlipidemia, unspecified: Secondary | ICD-10-CM | POA: Diagnosis not present

## 2021-05-21 NOTE — Progress Notes (Signed)
BP 133/66    Pulse 65    Ht _0  (1.702 m)    Wt 137 lb (62.1 kg)    SpO2 95%    BMI 21.46 kg/m    Subjective:   Patient ID: [name] [name], male    DOB: [birth year], 88 y.o.   MRN: [id number]  HPI: MACKLIN JACQUIN is a 88 y.o. male presenting on 05/21/2021 for Medical Management of Chronic Issues, Hyperlipidemia, and Hypertension   HPI Hypertension and CKD Patient is currently on furosemide and Imdur and spironolactone, and their blood pressure today is 133/66. Patient denies any lightheadedness or dizziness. Patient denies headaches, blurred vision, chest pains, shortness of breath, or weakness. Denies any side effects from medication and is content with current medication.  He says his breathing is about where it usually is.  He denies any major issues.  Hyperlipidemia and CAD and CHF Patient is coming in for recheck of his hyperlipidemia. The patient is currently taking fish oil. They deny any issues with myalgias or history of liver damage from it. They deny any focal numbness or weakness or chest pain.  Sees Dr. Caryl Comes with cardiology and has defibrillator pacemaker in place.  He denies any swelling and feels like things are doing well.  COPD Patient is coming in for COPD recheck today.  He is currently on no medicine currently.  He has a mild chronic cough but denies any major coughing spells or wheezing spells.  He has 0nighttime symptoms per week and 0daytime symptoms per week currently.  He says the only time he has issues is when it rains.  Relevant past medical, surgical, family and social history reviewed and updated as indicated. Interim medical history since our last visit reviewed. Allergies and medications reviewed and updated.  Review of Systems  Constitutional:  Negative for chills and fever.  Eyes:  Negative for visual disturbance.  Respiratory:  Negative for cough, chest tightness, shortness of breath and wheezing.   Cardiovascular:  Negative for chest pain, palpitations  and leg swelling.  Musculoskeletal:  Negative for back pain and gait problem.  Skin:  Negative for rash.  Neurological:  Negative for dizziness, weakness and light-headedness.  All other systems reviewed and are negative.  Per HPI unless specifically indicated above   Allergies as of 05/21/2021       Reactions   Bee Pollen    Pollen Extract    Ramipril Hives, Swelling   Crestor [rosuvastatin] Anxiety        Medication List        Accurate as of May 21, 2021  1:12 PM. If you have any questions, ask your nurse or doctor.          aspirin 81 MG tablet Take 81 mg by mouth daily.   Fish Oil 1000 MG Cpdr Take 1 capsule by mouth daily.   furosemide 40 MG tablet Commonly known as: LASIX Take 1 tablet (40 mg total) by mouth daily.   Ginseng 250 MG Caps Take 1 capsule by mouth daily.   isosorbide mononitrate 30 MG 24 hr tablet Commonly known as: IMDUR Take 1 tablet (30 mg total) by mouth in the morning.   multivitamin with minerals tablet Take 1 tablet by mouth daily.   nitroGLYCERIN 0.4 MG SL tablet Commonly known as: NITROSTAT Place 1 tablet (0.4 mg total) under the tongue every 5 (five) minutes as needed for chest pain.   spironolactone 25 MG tablet Commonly known as: ALDACTONE Take 0.5 tablets (  12.5 mg total) by mouth daily.   zinc gluconate 50 MG tablet Take 50 mg by mouth daily.         Objective:   BP 133/66    Pulse 65    Ht _0  (1.702 m)    Wt 137 lb (62.1 kg)    SpO2 95%    BMI 21.46 kg/m   Wt Readings from Last 3 Encounters:  05/21/21 137 lb (62.1 kg)  11/28/20 135 lb (61.2 kg)  11/22/20 135 lb (61.2 kg)    Physical Exam Vitals and nursing note reviewed.  Constitutional:      General: He is not in acute distress.    Appearance: He is well-developed. He is not diaphoretic.  Eyes:     General: No scleral icterus.    Conjunctiva/sclera: Conjunctivae normal.  Neck:     Thyroid: No thyromegaly.  Cardiovascular:     Rate and  Rhythm: Normal rate and regular rhythm.     Heart sounds: Normal heart sounds. No murmur heard. Pulmonary:     Effort: Pulmonary effort is normal. No respiratory distress.     Breath sounds: Wheezing (Some end expiratory wheezes but definitely not throughout but some bilateral more in lower lungs) present.  Musculoskeletal:        General: No swelling. Normal range of motion.     Cervical back: Neck supple.  Lymphadenopathy:     Cervical: No cervical adenopathy.  Skin:    General: Skin is warm and dry.     Findings: No rash.  Neurological:     Mental Status: He is alert and oriented to person, place, and time.     Coordination: Coordination normal.  Psychiatric:        Behavior: Behavior normal.      Assessment & Plan:   Problem List Items Addressed This Visit       Cardiovascular and Mediastinum   Essential hypertension   Relevant Orders   CBC with Differential/Platelet   CMP14+EGFR   Lipid panel   SYSTOLIC HEART FAILURE, CHRONIC     Respiratory   COPD (chronic obstructive pulmonary disease) (HCC)   Relevant Orders   CBC with Differential/Platelet   CMP14+EGFR   Lipid panel     Genitourinary   CKD (chronic kidney disease), stage III (HCC)   Relevant Orders   CMP14+EGFR     Other   Hyperlipidemia LDL goal <100 - Primary   Relevant Orders   CBC with Differential/Platelet   CMP14+EGFR   Lipid panel   Other Visit Diagnoses     Need for pneumococcal vaccination       Relevant Orders   Pneumococcal conjugate vaccine 20-valent (Prevnar 20)       Continue current medicine, no changes. Follow up plan: Return in about 6 months (around 11/18/2021), or if symptoms worsen or fail to improve, for Hypertension and CKD and CHF.  Counseling provided for all of the vaccine components Orders Placed This Encounter  Procedures   Pneumococcal conjugate vaccine 20-valent (Prevnar 20)   CBC with Differential/Platelet   CMP14+EGFR   Lipid panel    Caryl Pina,  MD [facility] 05/21/2021, 1:12 PM

## 2021-05-22 LAB — CBC WITH DIFFERENTIAL/PLATELET
Basophils Absolute: 0 10*3/uL (ref 0.0–0.2)
Basos: 1 %
EOS (ABSOLUTE): 0.2 10*3/uL (ref 0.0–0.4)
Eos: 2 %
Hematocrit: 46.4 % (ref 37.5–51.0)
Hemoglobin: 15.5 g/dL (ref 13.0–17.7)
Immature Grans (Abs): 0 10*3/uL (ref 0.0–0.1)
Immature Granulocytes: 0 %
Lymphocytes Absolute: 2.8 10*3/uL (ref 0.7–3.1)
Lymphs: 32 %
MCH: 29.8 pg (ref 26.6–33.0)
MCHC: 33.4 g/dL (ref 31.5–35.7)
MCV: 89 fL (ref 79–97)
Monocytes Absolute: 0.7 10*3/uL (ref 0.1–0.9)
Monocytes: 8 %
Neutrophils Absolute: 4.9 10*3/uL (ref 1.4–7.0)
Neutrophils: 57 %
Platelets: 177 10*3/uL (ref 150–450)
RBC: 5.2 x10E6/uL (ref 4.14–5.80)
RDW: 13 % (ref 11.6–15.4)
WBC: 8.6 10*3/uL (ref 3.4–10.8)

## 2021-05-22 LAB — LIPID PANEL
Chol/HDL Ratio: 5.8 ratio — ABNORMAL HIGH (ref 0.0–5.0)
Cholesterol, Total: 262 mg/dL — ABNORMAL HIGH (ref 100–199)
HDL: 45 mg/dL (ref >39)
LDL Chol Calc (NIH): 190 mg/dL — ABNORMAL HIGH (ref 0–99)
Triglycerides: 146 mg/dL (ref 0–149)
VLDL Cholesterol Cal: 27 mg/dL (ref 5–40)

## 2021-05-22 LAB — CMP14+EGFR
ALT: 6 IU/L (ref 0–44)
AST: 13 IU/L (ref 0–40)
Albumin/Globulin Ratio: 1.9 (ref 1.2–2.2)
Albumin: 4.4 g/dL (ref 3.6–4.6)
Alkaline Phosphatase: 56 IU/L (ref 44–121)
BUN/Creatinine Ratio: 11 (ref 10–24)
BUN: 15 mg/dL (ref 8–27)
Bilirubin Total: 0.6 mg/dL (ref 0.0–1.2)
CO2: 26 mmol/L (ref 20–29)
Calcium: 9.7 mg/dL (ref 8.6–10.2)
Chloride: 98 mmol/L (ref 96–106)
Creatinine, Ser: 1.31 mg/dL — ABNORMAL HIGH (ref 0.76–1.27)
Globulin, Total: 2.3 g/dL (ref 1.5–4.5)
Glucose: 93 mg/dL (ref 70–99)
Potassium: 4.6 mmol/L (ref 3.5–5.2)
Sodium: 136 mmol/L (ref 134–144)
Total Protein: 6.7 g/dL (ref 6.0–8.5)
eGFR: 52 mL/min/{1.73_m2} — ABNORMAL LOW (ref >59)

## 2021-05-28 ENCOUNTER — Other Ambulatory Visit: Payer: Self-pay

## 2021-05-28 MED ORDER — EZETIMIBE 10 MG PO TABS: 10.0000 mg | ORAL_TABLET | Freq: Every day | ORAL | 1 refills | Status: DC

## 2021-06-11 ENCOUNTER — Ambulatory Visit (INDEPENDENT_AMBULATORY_CARE_PROVIDER_SITE_OTHER): Payer: Medicare Other

## 2021-06-11 ENCOUNTER — Telehealth: Payer: Self-pay

## 2021-06-11 DIAGNOSIS — Z95810 Presence of automatic (implantable) cardiac defibrillator: Secondary | ICD-10-CM

## 2021-06-11 DIAGNOSIS — I5022 Chronic systolic (congestive) heart failure: Secondary | ICD-10-CM | POA: Diagnosis not present

## 2021-06-11 NOTE — Progress Notes (Signed)
EPIC Encounter for ICM Monitoring ? ?Patient Name: [name] [name] is a 88 y.o. male ?Date: 06/11/2021 ?Primary Care Physican: Dettinger, Fransisca Kaufmann, MD ?Primary Cardiologist: Caryl Comes ?Electrophysiologist: Caryl Comes ?Bi-V Pacing:  94.4%    ?04/04/2021 Weight: 140 lb ?  ?Time in AT/AF  0.0 hr/day (0.0%) ?  ?Attempted call to wife/patient and unable to reach.  Left message to return call. Transmission reviewed.  ?  ?Optivol Thoracic impedance suggesting possible fluid accumulation starting 1/26.  Fluid index > normal threshold starting 05/12/2021 ?  ?Prescribed:  ?Furosemide 40 mg 1 tablet by mouth daily ?Spironolactone 25 mg take 0.5 tablet (12.5 mg total) by mouth daily ?  ?Labs: ?05/21/2021 Creatinine 1.31, BUN 15, Potassium 4.6, Sodium 136, GFR 52 ?11/22/2020 Creatinine 1.31, BUN 16, Potassium 4.5, Sodium 138, GFR 52 ?05/15/2020 Creatinine 1.62, BUN 24, Potassium 4.3, Sodium 139, GFR 43 ?A complete set of results can be found in Results Review. ?  ?Recommendations:  Unable to reach.   ?  ?Follow-up plan: ICM clinic phone appointment on 06/25/2021 to recheck fluid levels.  91 day device clinic remote transmission 07/10/2021.     ?  ?EP/Cardiology Office Visits:  07/06/2021 with Dr. Caryl Comes (Msg sent to check in rep to have pt update DPR at visit).   ?  ?Copy of ICM check sent to Dr. Caryl Comes.    ? ?3 month ICM trend: 06/11/2021. ? ? ? ?12-14 Month ICM trend:  ? ? ? ?[name], RN ?06/11/2021 ?11:38 AM ? ?

## 2021-06-11 NOTE — Telephone Encounter (Signed)
Remote ICM transmission received.  Attempted call to patient regarding ICM remote transmission and left message per DPR to return call.   

## 2021-06-25 ENCOUNTER — Telehealth: Payer: Self-pay

## 2021-06-25 ENCOUNTER — Ambulatory Visit (INDEPENDENT_AMBULATORY_CARE_PROVIDER_SITE_OTHER): Payer: Medicare Other

## 2021-06-25 DIAGNOSIS — I5022 Chronic systolic (congestive) heart failure: Secondary | ICD-10-CM

## 2021-06-25 DIAGNOSIS — Z95810 Presence of automatic (implantable) cardiac defibrillator: Secondary | ICD-10-CM

## 2021-06-25 NOTE — Progress Notes (Signed)
EPIC Encounter for ICM Monitoring ? ?Patient Name: [name] [name] is a 88 y.o. male ?Date: 06/25/2021 ?Primary Care Physican: Dettinger, Fransisca Kaufmann, MD ?Primary Cardiologist: Caryl Comes ?Electrophysiologist: Caryl Comes ?Bi-V Pacing:  93.7%    ?04/04/2021 Weight: 140 lb ? ?Since 11-Jun-2021  ?Time in AT/AF  0.0 hr/day (0.0%) ?VT-NS (>4 beats, >162 bpm) 7 ?  ?Attempted call to wife/patient and unable to reach.  Left message to return call. Transmission reviewed.  ?  ?Optivol Thoracic impedance suggesting possible fluid accumulation starting 1/26.  Fluid index > normal threshold starting 05/12/2021 ?  ?Prescribed:  ?Furosemide 40 mg 1 tablet by mouth daily ?Spironolactone 25 mg take 0.5 tablet (12.5 mg total) by mouth daily ?  ?Labs: ?05/21/2021 Creatinine 1.31, BUN 15, Potassium 4.6, Sodium 136, GFR 52 ?11/22/2020 Creatinine 1.31, BUN 16, Potassium 4.5, Sodium 138, GFR 52 ?05/15/2020 Creatinine 1.62, BUN 24, Potassium 4.3, Sodium 139, GFR 43 ?A complete set of results can be found in Results Review. ?  ?Recommendations: Unable to reach.   ?  ?Follow-up plan: ICM clinic phone appointment on 07/16/2021 (fluid levels will be checked 4/14 OV).  91 day device clinic remote transmission 07/10/2021.     ?  ?EP/Cardiology Office Visits:  07/06/2021 with Dr. Caryl Comes (Msg sent to check in rep to have pt update DPR at visit).   ?  ?Copy of ICM check sent to Dr. Caryl Comes.    ? ?3 month ICM trend: 06/25/2021. ? ? ? ?12-14 Month ICM trend:  ? ? ? ?[name], RN ?06/25/2021 ?10:21 AM ? ?

## 2021-06-25 NOTE — Telephone Encounter (Signed)
Remote ICM transmission received.  Attempted call to wife/patient regarding ICM remote transmission and left message per DPR to return call.  

## 2021-07-02 ENCOUNTER — Other Ambulatory Visit: Payer: Self-pay | Admitting: Internal Medicine

## 2021-07-03 ENCOUNTER — Encounter: Payer: Medicare Other | Admitting: Internal Medicine

## 2021-07-06 ENCOUNTER — Ambulatory Visit: Payer: Medicare Other | Admitting: Internal Medicine

## 2021-07-06 ENCOUNTER — Encounter: Payer: Self-pay | Admitting: Internal Medicine

## 2021-07-06 VITALS — BP 140/74 | HR 71 | Ht 67.0 in | Wt 137.4 lb

## 2021-07-06 DIAGNOSIS — I5022 Chronic systolic (congestive) heart failure: Secondary | ICD-10-CM | POA: Diagnosis not present

## 2021-07-06 DIAGNOSIS — I255 Ischemic cardiomyopathy: Secondary | ICD-10-CM

## 2021-07-06 DIAGNOSIS — Z95810 Presence of automatic (implantable) cardiac defibrillator: Secondary | ICD-10-CM | POA: Diagnosis not present

## 2021-07-06 NOTE — Progress Notes (Signed)
? ? ? ? ?Patient Care Team: ?Dettinger, Fransisca Kaufmann, MD as PCP - General (Family Medicine) ? ? ?HPI ? ?[name] [name] is a 88 y.o. male ?seen in followup for Ischemic cardiomyopathy congestive heart failure for which he is s/p CRT-D implantation.  Device generator placement 9/15 and maintaining of CRT-D.  Status post AV junction ablation.  Frequent PVCs previously on amiodarone now discontinued ? ?Prior CABG   ? ?Device is programmed to subthreshold RV pacing output.  This dates back to December 2017.  Looking back at the notes they are mine and unfortunately incomplete.  There was a high RV pacing output of 3.5 V.  I presume it was done to allow for left ventricular only pacing.  This was done 9/17 Stable low amplitude R waves   ? ?The patient denies chest pain, shortness of breath, nocturnal dyspnea, orthopnea or peripheral edema.  There have been no palpitations, lightheadedness or syncope.  However, his family says that he is short of breath.  They also are concerned about his memory. ? ?More specifically, he denies not being able to do what ever he wants to do. ? ?Family is also concerned about rashes and wonders whether he does not have multitude of skin cancers. ? ?DATE TEST EF   ?7/11 LHC  40 % SVG-RCA-T; SVG-D1-T  ?9/17 Echo   40-45 %   ?4/22 ,MYOVIEW  <30% No ischemia/SCAR  ?5/22 Echo  30-35% Aneurysmal inferior wall   ? ?  ? ?Date Cr K TSH Hgb  ?3/15    4.9     ?7/18  1.85 4.7 2.62   ?1/19 1.4 4.8    ?9/20 1.37 4.4    ?2/22 1.62 4.3    ?2/23 1.31 4.6  15.5  ? ? ?  ? ?Past Surgical History:  ?Procedure Laterality Date  ? BI-VENTRICULAR IMPLANTABLE CARDIOVERTER DEFIBRILLATOR  (CRT-D)  2005; 2015  ? STJ CRTD implanted by Dr Caryl Comes  ? BI-VENTRICULAR IMPLANTABLE CARDIOVERTER DEFIBRILLATOR UPGRADE N/A 12/13/2013  ? Procedure: BI-VENTRICULAR IMPLANTABLE CARDIOVERTER DEFIBRILLATOR UPGRADE;  Surgeon: [name], MD;  Location: [facility] CATH LAB;  Service: Cardiovascular;  Laterality: N/A;  ? CORONARY ARTERY BYPASS GRAFT   1989  ? ? ?Current Outpatient Medications  ?Medication Sig Dispense Refill  ? aspirin 81 MG tablet Take 81 mg by mouth daily.    ? furosemide (LASIX) 40 MG tablet Take 1 tablet (40 mg total) by mouth daily. 90 tablet 3  ? Ginseng 250 MG CAPS Take 1 capsule by mouth daily.    ? isosorbide mononitrate (IMDUR) 30 MG 24 hr tablet Take 1 tablet (30 mg total) by mouth in the morning. 90 tablet 3  ? Multiple Vitamins-Minerals (MULTIVITAMIN WITH MINERALS) tablet Take 1 tablet by mouth daily.    ? nitroGLYCERIN (NITROSTAT) 0.4 MG SL tablet Place 1 tablet under tongue every 5 minutes as needed for chest pain. 25 tablet 0  ? Omega-3 Fatty Acids (FISH OIL) 1000 MG CPDR Take 1 capsule by mouth daily.     ? spironolactone (ALDACTONE) 25 MG tablet Take 0.5 tablets (12.5 mg total) by mouth daily. 45 tablet 3  ? zinc gluconate 50 MG tablet Take 50 mg by mouth daily.    ? ?No current facility-administered medications for this visit.  ? ? ?Allergies  ?Allergen Reactions  ? Bee Pollen   ? Pollen Extract   ? Ramipril Hives and Swelling  ? Crestor [Rosuvastatin] Anxiety  ? ? ?Review of Systems negative except from HPI and PMH ? ?  Physical Exam ?BP 140/74   Pulse 71   Ht '5\' 7"'$  (1.702 m)   Wt 137 lb 6.4 oz (62.3 kg)   SpO2 96%   BMI 21.52 kg/m?  ?Well developed and well nourished in no acute distress ?HENT normal ?Neck supple   ?Clear ?Rash on left chest below the ICD pocket ?Device pocket well healed; without hematoma or erythema.  There is no tethering  ?Regular rate and rhythm, no murmur ?Abd-soft with active BS ?No Clubbing cyanosis   edema ?Skin-warm and dry ?A & Oriented  Grossly normal sensory and motor function ? ?ECG P synchronous pacing 71 with an upright QRS lead  1 and a negative QRS lead V1 ?QRSd is about 160 ms and has been such ? ?Assessment and  Plan ? ?Ischemic cardiomyopathy s/p CABG   ? ?Congestive heart failure-chronic-systolic ? ?CRT-D-    ? ?PVCs ? ?ACE/ARB/ASNI contraindicated secondary to hives on ramipril ?   ?Remains short of breath.  We will resume bisoprolol at 2.5 mg a day initially and then will begin on SGLT2.  Blood pressure is on the higher side and these will help.  Also takes isosorbide already at 30 mg a day and hydralazine could be added adjunctively. ? ?He is euvolemic and we will continue him on his Aldactone at 12.5 mg a day. ? ?Was given his ischemic heart disease, he is not on statin was not available to tolerate ezetimibe and at 89 with concerns of his dementia we will not pursue PCSK9 therapy ?  ?

## 2021-07-06 NOTE — Patient Instructions (Signed)

## 2021-07-10 ENCOUNTER — Ambulatory Visit (INDEPENDENT_AMBULATORY_CARE_PROVIDER_SITE_OTHER): Payer: Medicare Other

## 2021-07-10 DIAGNOSIS — I429 Cardiomyopathy, unspecified: Secondary | ICD-10-CM

## 2021-07-10 LAB — CUP PACEART REMOTE DEVICE CHECK
Battery Remaining Longevity: 11 mo
Battery Voltage: 2.86 V
Brady Statistic AP VP Percent: 24.79 %
Brady Statistic AP VS Percent: 0.01 %
Brady Statistic AS VP Percent: 74.7 %
Brady Statistic AS VS Percent: 0.49 %
Brady Statistic RA Percent Paced: 23.35 %
Brady Statistic RV Percent Paced: 93.75 %
Date Time Interrogation Session: 20230418001803
HighPow Impedance: 46 Ohm
HighPow Impedance: 66 Ohm
Implantable Lead Implant Date: 20010823
Implantable Lead Implant Date: 20010823
Implantable Lead Implant Date: 20091119
Implantable Lead Location: 753858
Implantable Lead Location: 753859
Implantable Lead Location: 753860
Implantable Lead Model: 148
Implantable Lead Model: 6940
Implantable Lead Serial Number: 110961
Implantable Pulse Generator Implant Date: 20150921
Lead Channel Impedance Value: 1026 Ohm
Lead Channel Impedance Value: 304 Ohm
Lead Channel Impedance Value: 437 Ohm
Lead Channel Impedance Value: 456 Ohm
Lead Channel Impedance Value: 513 Ohm
Lead Channel Impedance Value: 646 Ohm
Lead Channel Pacing Threshold Amplitude: 0.625 V
Lead Channel Pacing Threshold Amplitude: 0.75 V
Lead Channel Pacing Threshold Amplitude: 2.5 V
Lead Channel Pacing Threshold Pulse Width: 0.4 ms
Lead Channel Pacing Threshold Pulse Width: 0.4 ms
Lead Channel Pacing Threshold Pulse Width: 0.4 ms
Lead Channel Sensing Intrinsic Amplitude: 1.125 mV
Lead Channel Sensing Intrinsic Amplitude: 1.125 mV
Lead Channel Sensing Intrinsic Amplitude: 2.875 mV
Lead Channel Sensing Intrinsic Amplitude: 2.875 mV
Lead Channel Setting Pacing Amplitude: 0.5 V
Lead Channel Setting Pacing Amplitude: 1.75 V
Lead Channel Setting Pacing Amplitude: 2.5 V
Lead Channel Setting Pacing Pulse Width: 0.03 ms
Lead Channel Setting Pacing Pulse Width: 0.4 ms
Lead Channel Setting Sensing Sensitivity: 0.3 mV

## 2021-07-16 ENCOUNTER — Telehealth: Payer: Self-pay

## 2021-07-16 ENCOUNTER — Ambulatory Visit (INDEPENDENT_AMBULATORY_CARE_PROVIDER_SITE_OTHER): Payer: Medicare Other

## 2021-07-16 DIAGNOSIS — I5022 Chronic systolic (congestive) heart failure: Secondary | ICD-10-CM | POA: Diagnosis not present

## 2021-07-16 DIAGNOSIS — Z95810 Presence of automatic (implantable) cardiac defibrillator: Secondary | ICD-10-CM

## 2021-07-16 NOTE — Progress Notes (Signed)
EPIC Encounter for ICM Monitoring ? ?Patient Name: [name] [name] is a 88 y.o. male ?Date: 07/16/2021 ?Primary Care Physican: Dettinger, Fransisca Kaufmann, MD ?Primary Cardiologist: Caryl Comes ?Electrophysiologist: Caryl Comes ?Bi-V Pacing:  94.5%    ?07/06/2021 Office Weight: 137 lb ?  ?Since 10-Jul-2021 ?Time in AT/AF  0.0 hr/day (0.0%) ?VT-NS (>4 beats, >162 bpm)   2 ?  ?Attempted call to patient and unable to reach.  Transmission reviewed. .  Pt euvolemic at 07/09/2021 OV with Dr Caryl Comes despite Fluid index remains > normal threshold and thoracic impedance continues to suggest ongoing fluid accumulation. ?  ?Optivol Thoracic impedance suggesting possible fluid accumulation starting 1/26.  Fluid index > normal threshold starting 05/12/2021 ?  ?Prescribed:  ?Furosemide 40 mg 1 tablet by mouth daily ?Spironolactone 25 mg take 0.5 tablet (12.5 mg total) by mouth daily ?  ?Labs: ?05/21/2021 Creatinine 1.31, BUN 15, Potassium 4.6, Sodium 136, GFR 52 ?11/22/2020 Creatinine 1.31, BUN 16, Potassium 4.5, Sodium 138, GFR 52 ?05/15/2020 Creatinine 1.62, BUN 24, Potassium 4.3, Sodium 139, GFR 43 ?A complete set of results can be found in Results Review. ?  ?Recommendations: Unable to reach.   ?  ?Follow-up plan: ICM clinic phone appointment on 08/21/2021.  91 day device clinic remote transmission 10/09/2021.     ?  ?EP/Cardiology Office Visits:  Recall 07/01/2022 with Dr. Caryl Comes.  ?  ?Copy of ICM check sent to Dr. Caryl Comes.    ? ?3 month ICM trend: 07/16/2021. ? ? ? ?12-14 Month ICM trend:  ? ? ? ?[name], RN ?07/16/2021 ?9:54 AM ? ?

## 2021-07-16 NOTE — Telephone Encounter (Signed)
Remote ICM transmission received.  Attempted call to patient regarding ICM remote transmission and no answer.  

## 2021-07-27 NOTE — Progress Notes (Signed)
Remote ICD transmission.   

## 2021-08-21 ENCOUNTER — Ambulatory Visit (INDEPENDENT_AMBULATORY_CARE_PROVIDER_SITE_OTHER): Payer: Medicare Other

## 2021-08-21 DIAGNOSIS — Z95810 Presence of automatic (implantable) cardiac defibrillator: Secondary | ICD-10-CM

## 2021-08-21 DIAGNOSIS — I5022 Chronic systolic (congestive) heart failure: Secondary | ICD-10-CM | POA: Diagnosis not present

## 2021-08-24 NOTE — Progress Notes (Signed)
EPIC Encounter for ICM Monitoring  Patient Name: [name] [name] is a 88 y.o. male Date: 08/24/2021 Primary Care Physican: Dettinger, Fransisca Kaufmann, MD Primary Cardiologist: Caryl Comes Electrophysiologist: Vergie Living Pacing:  94.6%    08/24/2021 Weight: 145 lb   Since 16-Jul-2021 Time in AT/AF  <0.1 hr/day (<0.1%) VT-NS (>4 beats, >162 bpm)   13   Spoke with wife and patient and heart failure questions reviewed.  Pt asymptomatic for fluid accumulation.  Reports feeling well at this time and voices no complaints.   Wife asked to confirm her battery replacement date for her device.  She states she thinks it is 6/14.  Advised that is correct date.  Wife provided date of birth and full name to check battery replacement date.   Optivol Thoracic impedance suggesting normal fluid levels.    Prescribed:  Furosemide 40 mg 1 tablet by mouth daily Spironolactone 25 mg take 0.5 tablet (12.5 mg total) by mouth daily   Labs: 05/21/2021 Creatinine 1.31, BUN 15, Potassium 4.6, Sodium 136, GFR 52 11/22/2020 Creatinine 1.31, BUN 16, Potassium 4.5, Sodium 138, GFR 52 05/15/2020 Creatinine 1.62, BUN 24, Potassium 4.3, Sodium 139, GFR 43 A complete set of results can be found in Results Review.   Recommendations: No changes and encouraged to call if experiencing any fluid symptoms.   Follow-up plan: ICM clinic phone appointment on 09/24/2021.  91 day device clinic remote transmission 10/09/2021.       EP/Cardiology Office Visits:  Recall 07/01/2022 with Dr. Caryl Comes.    Copy of ICM check sent to Dr. Caryl Comes.   3 month ICM trend: 08/21/2021.    12-14 Month ICM trend:     [name], RN 08/24/2021 12:33 PM

## 2021-09-19 ENCOUNTER — Ambulatory Visit: Payer: Medicare Other | Admitting: Family Medicine

## 2021-09-27 ENCOUNTER — Telehealth: Payer: Self-pay

## 2021-09-27 NOTE — Telephone Encounter (Signed)
I spoke with the patient wife and she agreed to have him send a transmission for Margarita Grizzle.

## 2021-09-28 NOTE — Progress Notes (Signed)
No ICM remote transmission received for 09/24/2021 and next ICM transmission scheduled for 10/10/2021.

## 2021-10-09 ENCOUNTER — Ambulatory Visit (INDEPENDENT_AMBULATORY_CARE_PROVIDER_SITE_OTHER): Payer: Medicare Other

## 2021-10-09 DIAGNOSIS — I255 Ischemic cardiomyopathy: Secondary | ICD-10-CM | POA: Diagnosis not present

## 2021-10-09 LAB — CUP PACEART REMOTE DEVICE CHECK
Battery Remaining Longevity: 7 mo
Battery Voltage: 2.85 V
Brady Statistic AP VP Percent: 26.65 %
Brady Statistic AP VS Percent: 0.02 %
Brady Statistic AS VP Percent: 72.74 %
Brady Statistic AS VS Percent: 0.6 %
Brady Statistic RA Percent Paced: 25.07 %
Brady Statistic RV Percent Paced: 93.38 %
Date Time Interrogation Session: 20230718033423
HighPow Impedance: 46 Ohm
HighPow Impedance: 62 Ohm
Implantable Lead Implant Date: 20010823
Implantable Lead Implant Date: 20010823
Implantable Lead Implant Date: 20091119
Implantable Lead Location: 753858
Implantable Lead Location: 753859
Implantable Lead Location: 753860
Implantable Lead Model: 148
Implantable Lead Model: 6940
Implantable Lead Serial Number: 110961
Implantable Pulse Generator Implant Date: 20150921
Lead Channel Impedance Value: 304 Ohm
Lead Channel Impedance Value: 437 Ohm
Lead Channel Impedance Value: 456 Ohm
Lead Channel Impedance Value: 513 Ohm
Lead Channel Impedance Value: 589 Ohm
Lead Channel Impedance Value: 969 Ohm
Lead Channel Pacing Threshold Amplitude: 0.75 V
Lead Channel Pacing Threshold Amplitude: 0.75 V
Lead Channel Pacing Threshold Amplitude: 2.5 V
Lead Channel Pacing Threshold Pulse Width: 0.4 ms
Lead Channel Pacing Threshold Pulse Width: 0.4 ms
Lead Channel Pacing Threshold Pulse Width: 0.4 ms
Lead Channel Sensing Intrinsic Amplitude: 1.5 mV
Lead Channel Sensing Intrinsic Amplitude: 1.5 mV
Lead Channel Sensing Intrinsic Amplitude: 4.25 mV
Lead Channel Sensing Intrinsic Amplitude: 4.25 mV
Lead Channel Setting Pacing Amplitude: 0.5 V
Lead Channel Setting Pacing Amplitude: 1.75 V
Lead Channel Setting Pacing Amplitude: 2.5 V
Lead Channel Setting Pacing Pulse Width: 0.03 ms
Lead Channel Setting Pacing Pulse Width: 0.4 ms
Lead Channel Setting Sensing Sensitivity: 0.3 mV

## 2021-10-10 ENCOUNTER — Ambulatory Visit (INDEPENDENT_AMBULATORY_CARE_PROVIDER_SITE_OTHER): Payer: Medicare Other

## 2021-10-10 DIAGNOSIS — I5022 Chronic systolic (congestive) heart failure: Secondary | ICD-10-CM | POA: Diagnosis not present

## 2021-10-10 DIAGNOSIS — Z95810 Presence of automatic (implantable) cardiac defibrillator: Secondary | ICD-10-CM

## 2021-10-10 NOTE — Progress Notes (Signed)
EPIC Encounter for ICM Monitoring  Patient Name: [name] [name] is a 88 y.o. male Date: 10/10/2021 Primary Care Physican: Dettinger, Fransisca Kaufmann, MD Primary Cardiologist: Caryl Comes Electrophysiologist: Vergie Living Pacing:  93.5%    08/24/2021 Weight: 145 lb 10/10/2021 Weight: 140 lbs   Since 09-Oct-2021 Time in AT/AF  0.0 hr/day (0.0%)  Battery Longevity: 7 months   Spoke with wife and patient and heart failure questions reviewed.  Pt asymptomatic for fluid accumulation.    Optivol Thoracic impedance suggesting normal fluid levels.    Prescribed:  Furosemide 40 mg 1 tablet by mouth daily Spironolactone 25 mg take 0.5 tablet (12.5 mg total) by mouth daily   Labs: 05/21/2021 Creatinine 1.31, BUN 15, Potassium 4.6, Sodium 136, GFR 52 11/22/2020 Creatinine 1.31, BUN 16, Potassium 4.5, Sodium 138, GFR 52 05/15/2020 Creatinine 1.62, BUN 24, Potassium 4.3, Sodium 139, GFR 43 A complete set of results can be found in Results Review.   Recommendations: No changes and encouraged to call if experiencing any fluid symptoms.   Follow-up plan: ICM clinic phone appointment on 11/12/2021.  91 day device clinic remote transmission 01/08/2022.       EP/Cardiology Office Visits:  Recall 07/01/2022 with Dr. Caryl Comes.    Copy of ICM check sent to Dr. Caryl Comes.   3 month ICM trend: 10/10/2021.    12-14 Month ICM trend:     [name], RN 10/10/2021 10:14 AM

## 2021-10-22 ENCOUNTER — Other Ambulatory Visit: Payer: Self-pay | Admitting: Internal Medicine

## 2021-11-02 ENCOUNTER — Ambulatory Visit: Payer: Medicare Other | Admitting: Family Medicine

## 2021-11-05 ENCOUNTER — Ambulatory Visit (INDEPENDENT_AMBULATORY_CARE_PROVIDER_SITE_OTHER): Payer: Medicare Other | Admitting: Family Medicine

## 2021-11-05 ENCOUNTER — Other Ambulatory Visit: Payer: Self-pay | Admitting: Family Medicine

## 2021-11-05 ENCOUNTER — Encounter: Payer: Self-pay | Admitting: Family Medicine

## 2021-11-05 VITALS — BP 130/67 | HR 73 | Temp 97.2°F | Ht 67.0 in | Wt 131.0 lb

## 2021-11-05 DIAGNOSIS — N1831 Chronic kidney disease, stage 3a: Secondary | ICD-10-CM

## 2021-11-05 DIAGNOSIS — E785 Hyperlipidemia, unspecified: Secondary | ICD-10-CM

## 2021-11-05 DIAGNOSIS — J449 Chronic obstructive pulmonary disease, unspecified: Secondary | ICD-10-CM

## 2021-11-05 DIAGNOSIS — I10 Essential (primary) hypertension: Secondary | ICD-10-CM

## 2021-11-05 DIAGNOSIS — I5022 Chronic systolic (congestive) heart failure: Secondary | ICD-10-CM

## 2021-11-05 DIAGNOSIS — Z23 Encounter for immunization: Secondary | ICD-10-CM

## 2021-11-05 LAB — CMP14+EGFR
ALT: 10 IU/L (ref 0–44)
AST: 12 IU/L (ref 0–40)
Albumin/Globulin Ratio: 1.8 (ref 1.2–2.2)
Albumin: 4.1 g/dL (ref 3.7–4.7)
Alkaline Phosphatase: 57 IU/L (ref 44–121)
BUN/Creatinine Ratio: 12 (ref 10–24)
BUN: 17 mg/dL (ref 8–27)
Bilirubin Total: 0.7 mg/dL (ref 0.0–1.2)
CO2: 24 mmol/L (ref 20–29)
Calcium: 9.7 mg/dL (ref 8.6–10.2)
Chloride: 104 mmol/L (ref 96–106)
Creatinine, Ser: 1.39 mg/dL — ABNORMAL HIGH (ref 0.76–1.27)
Globulin, Total: 2.3 g/dL (ref 1.5–4.5)
Glucose: 102 mg/dL — ABNORMAL HIGH (ref 70–99)
Potassium: 4.3 mmol/L (ref 3.5–5.2)
Sodium: 141 mmol/L (ref 134–144)
Total Protein: 6.4 g/dL (ref 6.0–8.5)
eGFR: 48 mL/min/{1.73_m2} — ABNORMAL LOW (ref >59)

## 2021-11-05 LAB — CBC WITH DIFFERENTIAL/PLATELET
Basophils Absolute: 0.1 10*3/uL (ref 0.0–0.2)
Basos: 1 %
EOS (ABSOLUTE): 0.5 10*3/uL — ABNORMAL HIGH (ref 0.0–0.4)
Eos: 6 %
Hematocrit: 46.2 % (ref 37.5–51.0)
Hemoglobin: 15.4 g/dL (ref 13.0–17.7)
Immature Grans (Abs): 0 10*3/uL (ref 0.0–0.1)
Immature Granulocytes: 0 %
Lymphocytes Absolute: 2.8 10*3/uL (ref 0.7–3.1)
Lymphs: 33 %
MCH: 30.1 pg (ref 26.6–33.0)
MCHC: 33.3 g/dL (ref 31.5–35.7)
MCV: 90 fL (ref 79–97)
Monocytes Absolute: 0.6 10*3/uL (ref 0.1–0.9)
Monocytes: 7 %
Neutrophils Absolute: 4.6 10*3/uL (ref 1.4–7.0)
Neutrophils: 53 %
Platelets: 160 10*3/uL (ref 150–450)
RBC: 5.11 x10E6/uL (ref 4.14–5.80)
RDW: 13.1 % (ref 11.6–15.4)
WBC: 8.5 10*3/uL (ref 3.4–10.8)

## 2021-11-05 LAB — LIPID PANEL
Chol/HDL Ratio: 5 ratio (ref 0.0–5.0)
Cholesterol, Total: 253 mg/dL — ABNORMAL HIGH (ref 100–199)
HDL: 51 mg/dL (ref >39)
LDL Chol Calc (NIH): 183 mg/dL — ABNORMAL HIGH (ref 0–99)
Triglycerides: 105 mg/dL (ref 0–149)
VLDL Cholesterol Cal: 19 mg/dL (ref 5–40)

## 2021-11-05 NOTE — Addendum Note (Signed)
Addended by: Alphonzo Dublin on: 11/05/2021 11:20 AM   Modules accepted: Orders

## 2021-11-05 NOTE — Progress Notes (Signed)
Placed lab orders for patient

## 2021-11-05 NOTE — Progress Notes (Signed)
BP 130/67   Pulse 73   Temp (!) 97.2 F (36.2 C)   Ht '5\' 7"'$  (1.702 m)   Wt 131 lb (59.4 kg)   SpO2 95%   BMI 20.52 kg/m    Subjective:   Patient ID: [name] [name], male    DOB: Dec 03, 1930, 88 y.o.   MRN: [id number]  HPI: [name] [name] is a 88 y.o. male presenting on 11/05/2021 for Medical Management of Chronic Issues, Hyperlipidemia, and Hypertension   HPI Hyperlipidemia Patient is coming in for recheck of his hyperlipidemia. The patient is currently taking fish oil. They deny any issues with myalgias or history of liver damage from it. They deny any focal numbness or weakness or chest pain.   Hypertension and CKD and CAD and CHF with defibrillator, sees cardiology Patient is currently on furosemide and spironolactone, and their blood pressure today is 130/67. Patient denies any lightheadedness or dizziness. Patient denies headaches, blurred vision, chest pains, shortness of breath, or weakness. Denies any side effects from medication and is content with current medication.  He feels he is been stable, he said that he is going to need a new battery replacement later this year or early next year.  COPD Patient is coming in for COPD recheck today.  He is currently on no medicine, but is stable.  He has a mild chronic cough but denies any major coughing spells or wheezing spells.  He has 0 nighttime symptoms per week and 0 daytime symptoms per week currently.   Relevant past medical, surgical, family and social history reviewed and updated as indicated. Interim medical history since our last visit reviewed. Allergies and medications reviewed and updated.  Review of Systems  Constitutional:  Negative for chills and fever.  Eyes:  Negative for visual disturbance.  Respiratory:  Negative for shortness of breath and wheezing.   Cardiovascular:  Negative for chest pain and leg swelling.  Musculoskeletal:  Negative for back pain and gait problem.  Skin:  Negative for rash.  Neurological:   Negative for dizziness, weakness and light-headedness.  All other systems reviewed and are negative.   Per HPI unless specifically indicated above   Allergies as of 11/05/2021       Reactions   Bee Pollen    Pollen Extract    Ramipril Hives, Swelling   Crestor [rosuvastatin] Anxiety        Medication List        Accurate as of November 05, 2021 10:16 AM. If you have any questions, ask your nurse or doctor.          aspirin 81 MG tablet Take 81 mg by mouth daily.   Fish Oil 1000 MG Cpdr Take 1 capsule by mouth daily.   furosemide 40 MG tablet Commonly known as: LASIX Take 1 tablet (40 mg total) by mouth daily.   Ginseng 250 MG Caps Take 1 capsule by mouth daily.   isosorbide mononitrate 30 MG 24 hr tablet Commonly known as: IMDUR TAKE ONE TABLET EVERY MORNING   multivitamin with minerals tablet Take 1 tablet by mouth daily.   nitroGLYCERIN 0.4 MG SL tablet Commonly known as: NITROSTAT Place 1 tablet under tongue every 5 minutes as needed for chest pain.   spironolactone 25 MG tablet Commonly known as: ALDACTONE Take 0.5 tablets (12.5 mg total) by mouth daily.   zinc gluconate 50 MG tablet Take 50 mg by mouth daily.         Objective:   BP 130/67  Pulse 73   Temp (!) 97.2 F (36.2 C)   Ht '5\' 7"'$  (1.702 m)   Wt 131 lb (59.4 kg)   SpO2 95%   BMI 20.52 kg/m   Wt Readings from Last 3 Encounters:  11/05/21 131 lb (59.4 kg)  07/06/21 137 lb 6.4 oz (62.3 kg)  05/21/21 137 lb (62.1 kg)    Physical Exam Vitals and nursing note reviewed.  Constitutional:      General: He is not in acute distress.    Appearance: He is well-developed. He is not diaphoretic.  Eyes:     General: No scleral icterus.    Conjunctiva/sclera: Conjunctivae normal.  Neck:     Thyroid: No thyromegaly.  Cardiovascular:     Rate and Rhythm: Normal rate and regular rhythm.     Heart sounds: Normal heart sounds. No murmur heard. Pulmonary:     Effort: Pulmonary effort  is normal. No respiratory distress.     Breath sounds: Normal breath sounds. No wheezing.  Musculoskeletal:        General: No swelling. Normal range of motion.     Cervical back: Neck supple.  Lymphadenopathy:     Cervical: No cervical adenopathy.  Skin:    General: Skin is warm and dry.     Findings: No rash.  Neurological:     Mental Status: He is alert and oriented to person, place, and time.     Coordination: Coordination normal.  Psychiatric:        Behavior: Behavior normal.       Assessment & Plan:   Problem List Items Addressed This Visit       Cardiovascular and Mediastinum   Essential hypertension   SYSTOLIC HEART FAILURE, CHRONIC     Respiratory   COPD (chronic obstructive pulmonary disease) (HCC) - Primary     Genitourinary   CKD (chronic kidney disease), stage III ([place])     Other   Hyperlipidemia LDL goal <100    Already had blood work drawn this morning, continue current medication, no changes.  Blood pressure and everything looks good today.  Continue to follow cardiology Follow up plan: Return in about 6 months (around 05/08/2022), or if symptoms worsen or fail to improve, for Hypertension and hyperlipidemia and COPD.  Counseling provided for all of the vaccine components No orders of the defined types were placed in this encounter.   Caryl Pina, MD [facility] 11/05/2021, 10:16 AM

## 2021-11-06 NOTE — Progress Notes (Signed)
Remote ICD transmission.   

## 2021-11-12 ENCOUNTER — Ambulatory Visit (INDEPENDENT_AMBULATORY_CARE_PROVIDER_SITE_OTHER): Payer: Medicare Other

## 2021-11-12 DIAGNOSIS — I5022 Chronic systolic (congestive) heart failure: Secondary | ICD-10-CM

## 2021-11-12 DIAGNOSIS — Z95810 Presence of automatic (implantable) cardiac defibrillator: Secondary | ICD-10-CM

## 2021-11-14 ENCOUNTER — Telehealth: Payer: Self-pay

## 2021-11-14 NOTE — Progress Notes (Signed)
EPIC Encounter for ICM Monitoring  Patient Name: [name] [name] is a 88 y.o. male Date: 11/14/2021 Primary Care Physican: Dettinger, Fransisca Kaufmann, MD Primary Cardiologist: Caryl Comes Electrophysiologist: Vergie Living Pacing:  93.4%    08/24/2021 Weight: 145 lb 10/10/2021 Weight: 140 lbs   Clinical Status Since 10-Oct-2021 Time in AT/AF  0.0 hr/day (0.0%)   Battery Longevity: 7 months   Attempted call to patient and unable to reach.  Transmission reviewed.    Optivol Thoracic impedance normal but was suggesting possible fluid accumulation from 7/21-8/18.    Prescribed:  Furosemide 40 mg 1 tablet by mouth daily Spironolactone 25 mg take 0.5 tablet (12.5 mg total) by mouth daily   Labs: 11/05/2021 Creatinine 1.39, BUN 17, Potassium 4.3, Sodium 141, GFR 48 05/21/2021 Creatinine 1.31, BUN 15, Potassium 4.6, Sodium 136, GFR 52 A complete set of results can be found in Results Review.   Recommendations:  Unable to reach.     Follow-up plan: ICM clinic phone appointment on 12/17/2021.  91 day device clinic remote transmission 01/08/2022.       EP/Cardiology Office Visits:  Recall 07/01/2022 with Dr. Caryl Comes.    Copy of ICM check sent to Dr. Caryl Comes.   3 month ICM trend: 11/12/2021.    12-14 Month ICM trend:     [name], RN 11/14/2021 1:09 PM

## 2021-11-14 NOTE — Telephone Encounter (Signed)
Remote ICM transmission received.  Attempted call to patient regarding ICM remote transmission and left detailed message per DPR.  Advised to return call for any fluid symptoms or questions. Next ICM remote transmission scheduled 12/17/2021.

## 2021-11-19 ENCOUNTER — Ambulatory Visit: Payer: Medicare Other | Admitting: Family Medicine

## 2021-11-29 ENCOUNTER — Ambulatory Visit: Payer: Medicare Other

## 2021-12-20 ENCOUNTER — Ambulatory Visit (INDEPENDENT_AMBULATORY_CARE_PROVIDER_SITE_OTHER): Payer: Medicare Other

## 2021-12-20 DIAGNOSIS — I5022 Chronic systolic (congestive) heart failure: Secondary | ICD-10-CM

## 2021-12-20 DIAGNOSIS — Z95810 Presence of automatic (implantable) cardiac defibrillator: Secondary | ICD-10-CM

## 2021-12-20 NOTE — Progress Notes (Signed)
EPIC Encounter for ICM Monitoring  Patient Name: [name] [name] is a 88 y.o. male Date: 12/20/2021 Primary Care Physican: Dettinger, Fransisca Kaufmann, MD Primary Cardiologist: Caryl Comes Electrophysiologist: Vergie Living Pacing:  94.5%    08/24/2021 Weight: 145 lb 10/10/2021 Weight: 140 lbs   Clinical Status (12-Nov-2021 to 17-Dec-2021) Time in AT/AF  <0.1 hr/day (<0.1%)   Battery Longevity: 6 months   Spoke with patient and heart failure questions reviewed.  Transmission results reviewed.  Pt asymptomatic for fluid accumulation.  Reports feeling well at this time and voices no complaints.     Optivol Thoracic impedance suggesting possible fluid accumulation starting 8/21 with a few days at baseline within last month.  Impedance trending close to baseline 9/25.     Prescribed:  Furosemide 40 mg 1 tablet by mouth daily Spironolactone 25 mg take 0.5 tablet (12.5 mg total) by mouth daily   Labs: 11/05/2021 Creatinine 1.39, BUN 17, Potassium 4.3, Sodium 141, GFR 48 05/21/2021 Creatinine 1.31, BUN 15, Potassium 4.6, Sodium 136, GFR 52 A complete set of results can be found in Results Review.   Recommendations:  No changes and encouraged to call if experiencing any fluid symptoms.     Follow-up plan: ICM clinic phone appointment on 01/21/2022.  91 day device clinic remote transmission 01/08/2022.       EP/Cardiology Office Visits:  Recall 07/01/2022 with Dr. Caryl Comes.    Copy of ICM check sent to Dr. Caryl Comes.    3 month ICM trend: 12/20/2021.    12-14 Month ICM trend:     [name], RN 12/20/2021 1:47 PM

## 2021-12-27 ENCOUNTER — Other Ambulatory Visit: Payer: Self-pay | Admitting: Family Medicine

## 2021-12-27 DIAGNOSIS — I10 Essential (primary) hypertension: Secondary | ICD-10-CM

## 2022-01-03 DIAGNOSIS — C44622 Squamous cell carcinoma of skin of right upper limb, including shoulder: Secondary | ICD-10-CM | POA: Diagnosis not present

## 2022-01-03 DIAGNOSIS — C44329 Squamous cell carcinoma of skin of other parts of face: Secondary | ICD-10-CM | POA: Diagnosis not present

## 2022-01-04 ENCOUNTER — Ambulatory Visit (INDEPENDENT_AMBULATORY_CARE_PROVIDER_SITE_OTHER): Payer: Medicare Other

## 2022-01-04 DIAGNOSIS — Z0000 Encounter for general adult medical examination without abnormal findings: Secondary | ICD-10-CM | POA: Diagnosis not present

## 2022-01-04 NOTE — Progress Notes (Signed)
MEDICARE ANNUAL WELLNESS VISIT  01/04/2022  Telephone Visit Disclaimer This Medicare AWV was conducted by telephone due to national recommendations for restrictions regarding the COVID-19 Pandemic (e.g. social distancing).  I verified, using two identifiers, that I am speaking with [name] [name] or their authorized healthcare agent. I discussed the limitations, risks, security, and privacy concerns of performing an evaluation and management service by telephone and the potential availability of an in-person appointment in the future. The patient expressed understanding and agreed to proceed.  Location of Patient: Home Location of Provider (nurse):  [facility]  Subjective:    [name] [name] is a 88 y.o. male patient of Dettinger, Fransisca Kaufmann, MD who had a Medicare Annual Wellness Visit today via telephone. [name] [name] is a very pleasant man that lives with his wife here in [place]. 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 He does exercise on a regular basis. He has a pedal machine that he can pedal while watching TV and also a stationary bike that he rides daily. He attends church regularly and him and his wife have 5 children, some adopted.   Patient Care Team: Dettinger, Fransisca Kaufmann, MD as PCP - General (Family Medicine)     01/04/2022   12:54 PM 11/28/2020    2:47 PM 06/01/2019   11:24 AM 10/29/2017    2:14 PM 12/13/2013    6:07 AM  Advanced Directives  Does Patient Have a Medical Advance Directive? Yes No No No No  Type of Advance Directive Living will      Does patient want to make changes to medical advance directive?   No - Patient declined    Would patient like information on creating a medical advance directive?  No - Patient declined  Yes (MAU/Ambulatory/Procedural Areas - Information given) No - patient declined information    Hospital Utilization Over the Past 12 Months: # of hospitalizations or ER visits: 0 # of surgeries: 0  Review of Systems     Patient reports that his overall health is unchanged compared to last year.  History obtained from chart review  Patient Reported Readings (BP, Pulse, CBG, Weight, etc) none  Pain Assessment Pain : No/denies pain     Current Medications & Allergies (verified) Allergies as of 01/04/2022       Reactions   Bee Pollen    Pollen Extract    Ramipril Hives, Swelling   Crestor [rosuvastatin] Anxiety        Medication List        Accurate as of January 04, 2022  4:06 PM. If you have any questions, ask your nurse or doctor.          aspirin 81 MG tablet Take 81 mg by mouth daily.   Fish Oil 1000 MG Cpdr Take 1 capsule by mouth daily.   furosemide 40 MG tablet Commonly known as: LASIX Take 1 tablet (40 mg total) by mouth daily.   Ginseng 250 MG Caps Take 1 capsule by mouth daily.   isosorbide mononitrate 30 MG 24 hr tablet Commonly known as: IMDUR TAKE ONE TABLET EVERY MORNING   multivitamin with minerals tablet Take 1 tablet by mouth daily.   nitroGLYCERIN 0.4 MG SL tablet Commonly known as: NITROSTAT Place 1 tablet under tongue every 5 minutes as needed for chest pain.   spironolactone 25 MG tablet Commonly known as: ALDACTONE TAKE 1/2 TABLET ONCE DAILY   zinc gluconate 50 MG tablet Take 50  mg by mouth daily.        History (reviewed): Past Medical History:  Diagnosis Date   CAD (coronary artery disease)    status post coronary bypass graft surgery in 1989 with documented occulsion of the vein graft to the diagonal branch of the left anterior descending in 2001.   COPD (chronic obstructive pulmonary disease) (HCC)    Hyperlipidemia    Hypertension    Ischemic cardiomyopathy    ejection fraction of 30% improved 18-84% 1/66   Systolic congestive heart failure (HCC)    Class III systolic congestive heart, improved to class II, euvolemic.   Past Surgical History:  Procedure Laterality Date   BI-VENTRICULAR IMPLANTABLE CARDIOVERTER  DEFIBRILLATOR  (CRT-D)  2005; 2015   STJ CRTD implanted by Dr Caryl Comes   BI-VENTRICULAR IMPLANTABLE CARDIOVERTER DEFIBRILLATOR UPGRADE N/A 12/13/2013   Procedure: BI-VENTRICULAR IMPLANTABLE CARDIOVERTER DEFIBRILLATOR UPGRADE;  Surgeon: [name], MD;  Location: [facility] CATH LAB;  Service: Cardiovascular;  Laterality: N/A;   CORONARY ARTERY BYPASS GRAFT  1989   Family History  Problem Relation Age of Onset   Heart failure Mother    Social History   Socioeconomic History   Marital status: Married    Spouse name: Not on file   Number of children: 5   Years of education: Not on file   Highest education level: Not on file  Occupational History    Employer: RETIRED  Tobacco Use   Smoking status: Former    Packs/day: 1.00    Years: 43.00    Total pack years: 43.00    Types: Cigarettes    Quit date: 12/07/1987    Years since quitting: 34.1   Smokeless tobacco: Never  Vaping Use   Vaping Use: Never used  Substance and Sexual Activity   Alcohol use: No   Drug use: No   Sexual activity: Not on file  Other Topics Concern   Not on file  Social History Narrative   Lives with wife, 3rd marriage. Married x 13 years.    3 sons 2 daughters. All live close by.      Completed 10th grade      Social Determinants of Health   Financial Resource Strain: Low Risk  (01/04/2022)   Overall Financial Resource Strain (CARDIA)    Difficulty of Paying Living Expenses: Not hard at all  Food Insecurity: No Food Insecurity (01/04/2022)   Hunger Vital Sign    Worried About Running Out of Food in the Last Year: Never true    Ran Out of Food in the Last Year: Never true  Transportation Needs: No Transportation Needs (01/04/2022)   PRAPARE - Hydrologist (Medical): No    Lack of Transportation (Non-Medical): No  Physical Activity: Sufficiently Active (01/04/2022)   Exercise Vital Sign    Days of Exercise per Week: 7 days    Minutes of Exercise per Session: 30 min  Stress:  No Stress Concern Present (01/04/2022)   [place]    Feeling of Stress : Not at all  Social Connections: [place] (01/04/2022)   Social Connection and Isolation Panel [NHANES]    Frequency of Communication with Friends and Family: More than three times a week    Frequency of Social Gatherings with Friends and Family: Once a week    Attends Religious Services: More than 4 times per year    Active Member of Clubs or Organizations: Yes    Attends  Club or Organization Meetings: More than 4 times per year    Marital Status: Married    Activities of Daily Living    01/04/2022   12:55 PM  In your present state of health, do you have any difficulty performing the following activities:  Hearing? 0  Vision? 0  Difficulty concentrating or making decisions? 0  Walking or climbing stairs? 0  Dressing or bathing? 0  Doing errands, shopping? 0  Preparing Food and eating ? N  Using the Toilet? N  In the past six months, have you accidently leaked urine? N  Do you have problems with loss of bowel control? N  Managing your Medications? N  Managing your Finances? N  Housekeeping or managing your Housekeeping? N    Patient Education/ Literacy How often do you need to have someone help you when you read instructions, pamphlets, or other written materials from your doctor or pharmacy?: 1 - Never What is the last grade level you completed in school?: 10th grade  Exercise Current Exercise Habits: Home exercise routine, Type of exercise: treadmill, Time (Minutes): 30, Frequency (Times/Week): 5, Weekly Exercise (Minutes/Week): 150, Intensity: Mild  Diet Patient reports consuming 3 meals a day and 1 snack(s) a day Patient reports that his primary diet is: Regular Patient reports that she does have regular access to food.   Depression Screen    01/04/2022   12:54 PM 11/05/2021    9:51 AM 05/21/2021   12:57 PM 11/28/2020     2:42 PM 11/22/2020    1:06 PM 04/25/2020    1:59 PM 09/23/2019    1:47 PM  PHQ 2/9 Scores  PHQ - 2 Score 0 0 3 0 0 0 0  PHQ- 9 Score   6         Fall Risk    01/04/2022   12:54 PM 11/05/2021    9:51 AM 05/21/2021   12:57 PM 11/28/2020    2:47 PM 11/22/2020    1:06 PM  Fall Risk   Falls in the past year? 0 0 0 1 0  Number falls in past yr:    0   Injury with Fall?    0   Risk for fall due to :    Impaired vision   Follow up    Falls prevention discussed      Objective:  [name] [name] seemed alert and oriented and he participated appropriately during our telephone visit.  Blood Pressure Weight BMI  BP Readings from Last 3 Encounters:  11/05/21 130/67  07/06/21 140/74  05/21/21 133/66   Wt Readings from Last 3 Encounters:  11/05/21 131 lb (59.4 kg)  07/06/21 137 lb 6.4 oz (62.3 kg)  05/21/21 137 lb (62.1 kg)   BMI Readings from Last 1 Encounters:  11/05/21 20.52 kg/m    *Unable to obtain current vital signs, weight, and BMI due to telephone visit type  Hearing/Vision  [name] [name] did not seem to have difficulty with hearing/understanding during the telephone conversation Reports that he has had a formal eye exam by an eye care professional within the past year Reports that he has not had a formal hearing evaluation within the past year *Unable to fully assess hearing and vision during telephone visit type  Cognitive Function:    01/04/2022   12:56 PM 06/01/2019   11:29 AM  6CIT Screen  What Year? 0 points 0 points  What month? 3 points 0 points  What time? 0 points 0 points  Count back  from 20 0 points 0 points  Months in reverse 0 points 0 points  Repeat phrase 0 points 8 points  Total Score 3 points 8 points   (Normal:0-7, Significant for Dysfunction: >8)  Normal Cognitive Function Screening: Yes   Immunization & Health Maintenance Record Immunization History  Administered Date(s) Administered   Fluad Quad(high Dose 65+) 11/26/2018, 01/20/2020, 01/04/2021    Influenza Inj Mdck Quad Pf 11/26/2018   Influenza, High Dose Seasonal PF 01/08/2017, 01/09/2018   Influenza,inj,Quad PF,6+ Mos 12/26/2015   Moderna SARS-COV2 Booster Vaccination 08/29/2020   Moderna Sars-Covid-2 Vaccination 08/26/2019, 09/23/2019   PNEUMOCOCCAL CONJUGATE-20 05/21/2021   Pneumococcal Conjugate-13 04/25/2020   Zoster Recombinat (Shingrix) 11/05/2021    Health Maintenance  Topic Date Due   Zoster Vaccines- Shingrix (2 of 2) 12/31/2021   COVID-19 Vaccine (3 - Moderna series) 01/20/2022 (Originally 10/24/2020)   TETANUS/TDAP  05/21/2022 (Originally 04/29/1949)   INFLUENZA VACCINE  06/23/2022 (Originally 10/23/2021)   Pneumonia Vaccine 92+ Years old  Completed   HPV VACCINES  Aged Out       Assessment  This is a routine wellness examination for [name] [name].  Health Maintenance: Due or Overdue Health Maintenance Due  Topic Date Due   Zoster Vaccines- Shingrix (2 of 2) 12/31/2021    [name] [name] does not need a referral for Community Assistance: Care Management:   no Social Work:    no Prescription Assistance:  no Nutrition/Diabetes Education:  no   Plan:  Personalized Goals  Goals Addressed             This Visit's Progress    Exercise 150 min/wk Moderate Activity         Personalized Health Maintenance & Screening Recommendations  Due for 2nd shingrix  Lung Cancer Screening Recommended: no (Low Dose CT Chest recommended if Age 34-80 years, 30 pack-year currently smoking OR have quit w/in past 15 years) Hepatitis C Screening recommended: no HIV Screening recommended: no  Advanced Directives: Written information was not prepared per patient's request.  Referrals & Orders No orders of the defined types were placed in this encounter.   Follow-up Plan Follow-up with Dettinger, Fransisca Kaufmann, MD as planned Schedule for regular follow up    I have personally reviewed and noted the following in the patient's chart:   Medical and social history Use of  alcohol, tobacco or illicit drugs  Current medications and supplements Functional ability and status Nutritional status Physical activity Advanced directives List of other physicians Hospitalizations, surgeries, and ER visits in previous 12 months Vitals Screenings to include cognitive, depression, and falls Referrals and appointments  In addition, I have reviewed and discussed with [name] [name] certain preventive protocols, quality metrics, and best practice recommendations. A written personalized care plan for preventive services as well as general preventive health recommendations is available and can be mailed to the patient at his request.      Rolena Infante LPN [id number]/[id number]

## 2022-01-08 ENCOUNTER — Ambulatory Visit (INDEPENDENT_AMBULATORY_CARE_PROVIDER_SITE_OTHER): Payer: Medicare Other

## 2022-01-08 DIAGNOSIS — I429 Cardiomyopathy, unspecified: Secondary | ICD-10-CM

## 2022-01-08 LAB — CUP PACEART REMOTE DEVICE CHECK
Battery Remaining Longevity: 5 mo
Battery Voltage: 2.83 V
Brady Statistic AP VP Percent: 31.92 %
Brady Statistic AP VS Percent: 0.02 %
Brady Statistic AS VP Percent: 67.28 %
Brady Statistic AS VS Percent: 0.78 %
Brady Statistic RA Percent Paced: 29.74 %
Brady Statistic RV Percent Paced: 92.44 %
Date Time Interrogation Session: 20231017001602
HighPow Impedance: 50 Ohm
HighPow Impedance: 68 Ohm
Implantable Lead Implant Date: 20010823
Implantable Lead Implant Date: 20010823
Implantable Lead Implant Date: 20091119
Implantable Lead Location: 753858
Implantable Lead Location: 753859
Implantable Lead Location: 753860
Implantable Lead Model: 148
Implantable Lead Model: 6940
Implantable Lead Serial Number: 110961
Implantable Pulse Generator Implant Date: 20150921
Lead Channel Impedance Value: 1026 Ohm
Lead Channel Impedance Value: 304 Ohm
Lead Channel Impedance Value: 437 Ohm
Lead Channel Impedance Value: 494 Ohm
Lead Channel Impedance Value: 513 Ohm
Lead Channel Impedance Value: 665 Ohm
Lead Channel Pacing Threshold Amplitude: 0.75 V
Lead Channel Pacing Threshold Amplitude: 0.75 V
Lead Channel Pacing Threshold Amplitude: 2.5 V
Lead Channel Pacing Threshold Pulse Width: 0.4 ms
Lead Channel Pacing Threshold Pulse Width: 0.4 ms
Lead Channel Pacing Threshold Pulse Width: 0.4 ms
Lead Channel Sensing Intrinsic Amplitude: 1.25 mV
Lead Channel Sensing Intrinsic Amplitude: 1.25 mV
Lead Channel Sensing Intrinsic Amplitude: 4.5 mV
Lead Channel Sensing Intrinsic Amplitude: 4.5 mV
Lead Channel Setting Pacing Amplitude: 0.5 V
Lead Channel Setting Pacing Amplitude: 1.75 V
Lead Channel Setting Pacing Amplitude: 2.5 V
Lead Channel Setting Pacing Pulse Width: 0.03 ms
Lead Channel Setting Pacing Pulse Width: 0.4 ms
Lead Channel Setting Sensing Sensitivity: 0.3 mV

## 2022-01-21 ENCOUNTER — Ambulatory Visit (INDEPENDENT_AMBULATORY_CARE_PROVIDER_SITE_OTHER): Payer: Medicare Other

## 2022-01-21 DIAGNOSIS — I5022 Chronic systolic (congestive) heart failure: Secondary | ICD-10-CM | POA: Diagnosis not present

## 2022-01-21 DIAGNOSIS — Z95810 Presence of automatic (implantable) cardiac defibrillator: Secondary | ICD-10-CM | POA: Diagnosis not present

## 2022-01-22 NOTE — Progress Notes (Unsigned)
EPIC Encounter for ICM Monitoring  Patient Name: [name] [name] is a 88 y.o. male Date: 01/22/2022 Primary Care Physican: Dettinger, Fransisca Kaufmann, MD Primary Cardiologist: Caryl Comes Electrophysiologist: Vergie Living Pacing:  90.6%    08/24/2021 Weight: 145 lb 10/10/2021 Weight: 140 lbs   Since 08-Jan-2022 Time in AT/AF   0.0 hr/day (0.0%)   Battery Longevity: 5 months   Spoke with patient and heart failure questions reviewed.  Transmission results reviewed.  Pt asymptomatic for fluid accumulation.  Reports feeling well at this time and voices no complaints.     Optivol Thoracic impedance normal with exception of possible fluid accumulation from 10/9-10/23.     Prescribed:  Furosemide 40 mg 1 tablet by mouth daily Spironolactone 25 mg take 0.5 tablet (12.5 mg total) by mouth daily   Labs: 11/05/2021 Creatinine 1.39, BUN 17, Potassium 4.3, Sodium 141, GFR 48 05/21/2021 Creatinine 1.31, BUN 15, Potassium 4.6, Sodium 136, GFR 52 A complete set of results can be found in Results Review.   Recommendations:  No changes and encouraged to call if experiencing any fluid symptoms.   Follow-up plan: ICM clinic phone appointment on 02/25/2022.  91 day device clinic remote transmission 04/10/2022.       EP/Cardiology Office Visits:  Recall 07/01/2022 with Dr. Caryl Comes.    Copy of ICM check sent to Dr. Caryl Comes.   3 month ICM trend: 01/21/2022.    12-14 Month ICM trend:     [name], RN 01/22/2022 7:31 AM

## 2022-01-22 NOTE — Progress Notes (Signed)
Remote ICD transmission.   

## 2022-03-01 NOTE — Progress Notes (Signed)
No ICM remote transmission received for 02/25/2022 and next ICM transmission scheduled for 03/12/2023.

## 2022-03-11 ENCOUNTER — Ambulatory Visit (INDEPENDENT_AMBULATORY_CARE_PROVIDER_SITE_OTHER): Payer: Medicare Other

## 2022-03-11 DIAGNOSIS — I5022 Chronic systolic (congestive) heart failure: Secondary | ICD-10-CM

## 2022-03-11 DIAGNOSIS — Z95810 Presence of automatic (implantable) cardiac defibrillator: Secondary | ICD-10-CM

## 2022-03-11 NOTE — Progress Notes (Signed)
EPIC Encounter for ICM Monitoring  Patient Name: [name] [name] is a 88 y.o. male Date: 03/11/2022 Primary Care Physican: Dettinger, Fransisca Kaufmann, MD Primary Cardiologist: Caryl Comes Electrophysiologist: Vergie Living Pacing:  90.2%    08/24/2021 Weight: 145 lb 10/10/2021 Weight: 140 lbs 03/11/2022 Weight: not weighing   Since 21-Jan-2022 Time in AT/AF   0.0 hr/day (0.0%) VT-NS (>4 beats, >162 bpm) 23   Battery Longevity: 2 months - Discussed battery life and replacement.   Spoke with wife per DPR and heart failure questions reviewed.  Transmission results reviewed.  Pt has chest cold for past 2 weeks and not feeling well.   Encouraged to call PCP regarding chest cold.     Optivol Thoracic impedance suggesting possible fluid accumulation starting 12/10.     Prescribed:  Furosemide 40 mg 1 tablet by mouth daily Spironolactone 25 mg take 0.5 tablet (12.5 mg total) by mouth daily   Labs: 11/05/2021 Creatinine 1.39, BUN 17, Potassium 4.3, Sodium 141, GFR 48 05/21/2021 Creatinine 1.31, BUN 15, Potassium 4.6, Sodium 136, GFR 52 A complete set of results can be found in Results Review.   Recommendations:   Encouraged to call PCP office due to chest cold.  Advised to call if he develops any fluid symptoms.     Follow-up plan: ICM clinic phone appointment on 03/27/2022 to recheck fluid levels.  91 day device clinic remote transmission 04/10/2022.       EP/Cardiology Office Visits:  Recall 07/01/2022 with Dr. Caryl Comes.    Copy of ICM check sent to Dr. Caryl Comes.   3 month ICM trend: 03/11/2022.    12-14 Month ICM trend:     [name], RN 03/11/2022 9:10 AM

## 2022-03-12 ENCOUNTER — Ambulatory Visit (INDEPENDENT_AMBULATORY_CARE_PROVIDER_SITE_OTHER): Payer: Medicare Other | Admitting: Nurse Practitioner

## 2022-03-12 ENCOUNTER — Encounter: Payer: Self-pay | Admitting: Nurse Practitioner

## 2022-03-12 DIAGNOSIS — J00 Acute nasopharyngitis [common cold]: Secondary | ICD-10-CM | POA: Diagnosis not present

## 2022-03-12 NOTE — Progress Notes (Signed)
   Virtual Visit  Note Due to COVID-19 pandemic this visit was conducted virtually. This visit type was conducted due to national recommendations for restrictions regarding the COVID-19 Pandemic (e.g. social distancing, sheltering in place) in an effort to limit this patient's exposure and mitigate transmission in our community. All issues noted in this document were discussed and addressed.  A physical exam was not performed with this format.  I connected with [name] [name] on 03/12/22 at 9:19 by telephone and verified that I am speaking with the correct person using two identifiers. [name] [name] is currently located at home and his wife is currently with him during visit. The provider, Mary-Margaret Hassell Done, FNP is located in their office at time of visit.  I discussed the limitations, risks, security and privacy concerns of performing an evaluation and management service by telephone and the availability of in person appointments. I also discussed with the patient that there may be a patient responsible charge related to this service. The patient expressed understanding and agreed to proceed.   History and Present Illness:  URI  This is a new problem. The current episode started 1 to 4 weeks ago. The problem has been gradually improving. There has been no fever. Associated symptoms include congestion, coughing, rhinorrhea and sneezing. He has tried acetaminophen (OTC cough meds) for the symptoms. The treatment provided mild relief.      Review of Systems  HENT:  Positive for congestion, rhinorrhea and sneezing.   Respiratory:  Positive for cough.      Observations/Objective: Alert and oriented- answers all questions appropriately No distress Raspy voice Dry cough  Assessment and Plan: [name] in today with chief complaint of No chief complaint on file.   1. Acute nasopharyngitis 1. Take meds as prescribed 2. Use a cool mist humidifier especially during the winter months  and when heat has been humid. 3. Use saline nose sprays frequently 4. Saline irrigations of the nose can be very helpful if done frequently.  * 4X daily for 1 week*  * Use of a nettie pot can be helpful with this. Follow directions with this* 5. Drink plenty of fluids 6. Keep thermostat turn down low 7.For any cough or congestion- mucinex 8. For fever or aces or pains- take tylenol or ibuprofen appropriate for age and weight.  * for fevers greater than 101 orally you may alternate ibuprofen and tylenol every  3 hours.      Follow Up Instructions: prn    I discussed the assessment and treatment plan with the patient. The patient was provided an opportunity to ask questions and all were answered. The patient agreed with the plan and demonstrated an understanding of the instructions.   The patient was advised to call back or seek an in-person evaluation if the symptoms worsen or if the condition fails to improve as anticipated.  The above assessment and management plan was discussed with the patient. The patient verbalized understanding of and has agreed to the management plan. Patient is aware to call the clinic if symptoms persist or worsen. Patient is aware when to return to the clinic for a follow-up visit. Patient educated on when it is appropriate to go to the emergency department.   Time call ended:  9:30  I provided 11 minutes of  non face-to-face time during this encounter.    Mary-Margaret Hassell Done, FNP

## 2022-03-12 NOTE — Patient Instructions (Signed)

## 2022-03-27 ENCOUNTER — Ambulatory Visit (INDEPENDENT_AMBULATORY_CARE_PROVIDER_SITE_OTHER): Payer: Medicare Other

## 2022-03-27 DIAGNOSIS — Z95810 Presence of automatic (implantable) cardiac defibrillator: Secondary | ICD-10-CM

## 2022-03-27 DIAGNOSIS — I5022 Chronic systolic (congestive) heart failure: Secondary | ICD-10-CM

## 2022-03-27 NOTE — Progress Notes (Signed)
EPIC Encounter for ICM Monitoring  Patient Name: [name] [name] is a 89 y.o. male Date: 03/27/2022 Primary Care Physican: Dettinger, Fransisca Kaufmann, MD Primary Cardiologist: Caryl Comes Electrophysiologist: Vergie Living Pacing:  90.2%    08/24/2021 Weight: 145 lb 10/10/2021 Weight: 140 lbs 03/11/2022 Weight: not weighing   Since 11-Mar-2022 Time in AT/AF   0.0 hr/day (0.0%) VT-NS (>4 beats, >162 bpm)    23   Battery Longevity: 3 months - Discussed battery life and replacement.   Spoke with wife per DPR and heart failure questions reviewed.  Transmission results reviewed.  Pt has chest cold for past 2 weeks and not feeling well.   Encouraged to call PCP regarding chest cold.     Optivol Thoracic impedance suggesting fluid levels returned close to normal.     Prescribed:  Furosemide 40 mg 1 tablet by mouth daily Spironolactone 25 mg take 0.5 tablet (12.5 mg total) by mouth daily   Labs: 11/05/2021 Creatinine 1.39, BUN 17, Potassium 4.3, Sodium 141, GFR 48 05/21/2021 Creatinine 1.31, BUN 15, Potassium 4.6, Sodium 136, GFR 52 A complete set of results can be found in Results Review.   Recommendations:   Recommendation to limit salt intake to 2000 mg daily and fluid intake to 64 oz daily.  Encouraged to call if experiencing any fluid symptoms.    Follow-up plan: ICM clinic phone appointment on 04/22/2022.  91 day device clinic remote transmission 04/10/2022.       EP/Cardiology Office Visits:  Recall 07/01/2022 with Dr. Caryl Comes.    Copy of ICM check sent to Dr. Caryl Comes.   3 month ICM trend: 03/27/2022.    12-14 Month ICM trend:     [name], RN 03/27/2022 3:04 PM

## 2022-03-27 NOTE — Progress Notes (Signed)
EPIC Encounter for ICM Monitoring  Patient Name: [name] [name] is a 89 y.o. male Date: 03/27/2022 Primary Care Physican: Dettinger, Fransisca Kaufmann, MD Primary Cardiologist: Caryl Comes Electrophysiologist: Vergie Living Pacing:  91.1%    08/24/2021 Weight: 145 lb 10/10/2021 Weight: 140 lbs 03/11/2022 Weight: not weighing   Since 11-Mar-2022 Time in AT/AF <0.1 hr/day (<0.1%) VT-NS (>4 beats, >162 bpm) 13   Battery Longevity: 3 months   Spoke with wife per DPR and heart failure questions reviewed.  Transmission results reviewed.  Pt has chest cold for past 2 weeks and not feeling well.   Encouraged to call PCP regarding chest cold.     Optivol Thoracic impedance suggesting fluid levels returned close to normal.     Prescribed:  Furosemide 40 mg 1 tablet by mouth daily Spironolactone 25 mg take 0.5 tablet (12.5 mg total) by mouth daily   Labs: 11/05/2021 Creatinine 1.39, BUN 17, Potassium 4.3, Sodium 141, GFR 48 05/21/2021 Creatinine 1.31, BUN 15, Potassium 4.6, Sodium 136, GFR 52 A complete set of results can be found in Results Review.   Recommendations:   Encouraged to call PCP office due to chest cold.  Advised to call if he develops any fluid symptoms.     Follow-up plan: ICM clinic phone appointment on 03/27/2022 to recheck fluid levels.  91 day device clinic remote transmission 04/10/2022.       EP/Cardiology Office Visits:  Recall 07/01/2022 with Dr. Caryl Comes.    Copy of ICM check sent to Dr. Caryl Comes.   3 month ICM trend: 03/27/2022.    12-14 Month ICM trend:     [name], RN 03/27/2022 3:01 PM

## 2022-04-09 ENCOUNTER — Other Ambulatory Visit: Payer: Self-pay | Admitting: Family Medicine

## 2022-04-09 DIAGNOSIS — I10 Essential (primary) hypertension: Secondary | ICD-10-CM

## 2022-04-10 ENCOUNTER — Ambulatory Visit: Payer: Self-pay | Attending: Internal Medicine

## 2022-04-10 DIAGNOSIS — I429 Cardiomyopathy, unspecified: Secondary | ICD-10-CM

## 2022-04-11 LAB — CUP PACEART REMOTE DEVICE CHECK
Battery Remaining Longevity: 3 mo
Battery Voltage: 2.81 V
Brady Statistic AP VP Percent: 29.37 %
Brady Statistic AP VS Percent: 0.02 %
Brady Statistic AS VP Percent: 68.84 %
Brady Statistic AS VS Percent: 1.77 %
Brady Statistic RA Percent Paced: 26.21 %
Brady Statistic RV Percent Paced: 86.08 %
Date Time Interrogation Session: 20240117022824
HighPow Impedance: 50 Ohm
HighPow Impedance: 70 Ohm
Implantable Lead Connection Status: 753985
Implantable Lead Connection Status: 753985
Implantable Lead Connection Status: 753985
Implantable Lead Implant Date: 20010823
Implantable Lead Implant Date: 20010823
Implantable Lead Implant Date: 20091119
Implantable Lead Location: 753858
Implantable Lead Location: 753859
Implantable Lead Location: 753860
Implantable Lead Model: 148
Implantable Lead Model: 6940
Implantable Lead Serial Number: 110961
Implantable Pulse Generator Implant Date: 20150921
Lead Channel Impedance Value: 304 Ohm
Lead Channel Impedance Value: 437 Ohm
Lead Channel Impedance Value: 456 Ohm
Lead Channel Impedance Value: 513 Ohm
Lead Channel Impedance Value: 627 Ohm
Lead Channel Impedance Value: 969 Ohm
Lead Channel Pacing Threshold Amplitude: 0.75 V
Lead Channel Pacing Threshold Amplitude: 0.75 V
Lead Channel Pacing Threshold Amplitude: 2.5 V
Lead Channel Pacing Threshold Pulse Width: 0.4 ms
Lead Channel Pacing Threshold Pulse Width: 0.4 ms
Lead Channel Pacing Threshold Pulse Width: 0.4 ms
Lead Channel Sensing Intrinsic Amplitude: 1.5 mV
Lead Channel Sensing Intrinsic Amplitude: 1.5 mV
Lead Channel Sensing Intrinsic Amplitude: 5 mV
Lead Channel Sensing Intrinsic Amplitude: 5 mV
Lead Channel Setting Pacing Amplitude: 0.5 V
Lead Channel Setting Pacing Amplitude: 1.75 V
Lead Channel Setting Pacing Amplitude: 2.5 V
Lead Channel Setting Pacing Pulse Width: 0.03 ms
Lead Channel Setting Pacing Pulse Width: 0.4 ms
Lead Channel Setting Sensing Sensitivity: 0.3 mV
Zone Setting Status: 755011
Zone Setting Status: 755011

## 2022-04-22 ENCOUNTER — Ambulatory Visit: Payer: Self-pay | Attending: Internal Medicine

## 2022-04-22 DIAGNOSIS — Z95810 Presence of automatic (implantable) cardiac defibrillator: Secondary | ICD-10-CM

## 2022-04-22 DIAGNOSIS — I5022 Chronic systolic (congestive) heart failure: Secondary | ICD-10-CM

## 2022-04-24 NOTE — Progress Notes (Signed)
EPIC Encounter for ICM Monitoring  Patient Name: [name] [name] is a 89 y.o. male Date: 04/24/2022 Primary Care Physican: Dettinger, Fransisca Kaufmann, MD Primary Cardiologist: Caryl Comes Electrophysiologist: Vergie Living Pacing:  91.3%    08/24/2021 Weight: 145 lb 10/10/2021 Weight: 140 lbs 03/11/2022 Weight: not weighing   Since 10-Apr-2022 Time in AT/AF   0.0 hr/day (0.0%) VT-NS (>4 beats, >162 bpm)    4   Battery Longevity: 3 months - Discussed battery life and replacement.   Spoke with patient and heart failure questions reviewed.  Transmission results reviewed.  Pt asymptomatic for fluid accumulation.  Reports feeling well at this time and voices no complaints.     Optivol Thoracic impedance suggesting fluid levels normal since 1/8.   Prescribed:  Furosemide 40 mg 1 tablet by mouth daily Spironolactone 25 mg take 0.5 tablet (12.5 mg total) by mouth daily   Labs: 11/05/2021 Creatinine 1.39, BUN 17, Potassium 4.3, Sodium 141, GFR 48 05/21/2021 Creatinine 1.31, BUN 15, Potassium 4.6, Sodium 136, GFR 52 A complete set of results can be found in Results Review.   Recommendations:   Recommendation to limit salt intake to 2000 mg daily and fluid intake to 64 oz daily.  Encouraged to call if experiencing any fluid symptoms.    Follow-up plan: ICM clinic phone appointment on 05/27/2022.  91 day device clinic remote transmission 07/10/2022.       EP/Cardiology Office Visits:  Recall 07/01/2022 with Dr. Caryl Comes.    Copy of ICM check sent to Dr. Caryl Comes.   3 month ICM trend: 04/22/2022.    12-14 Month ICM trend:     [name], RN 04/24/2022 3:14 PM

## 2022-05-02 NOTE — Progress Notes (Signed)
Remote ICD transmission.   

## 2022-05-08 ENCOUNTER — Ambulatory Visit (INDEPENDENT_AMBULATORY_CARE_PROVIDER_SITE_OTHER): Payer: Medicare Other | Admitting: Family Medicine

## 2022-05-08 ENCOUNTER — Encounter: Payer: Self-pay | Admitting: Family Medicine

## 2022-05-08 VITALS — BP 145/75 | HR 75 | Temp 97.7°F | Ht 67.0 in | Wt 127.0 lb

## 2022-05-08 DIAGNOSIS — J449 Chronic obstructive pulmonary disease, unspecified: Secondary | ICD-10-CM

## 2022-05-08 DIAGNOSIS — I130 Hypertensive heart and chronic kidney disease with heart failure and stage 1 through stage 4 chronic kidney disease, or unspecified chronic kidney disease: Secondary | ICD-10-CM | POA: Diagnosis not present

## 2022-05-08 DIAGNOSIS — I10 Essential (primary) hypertension: Secondary | ICD-10-CM | POA: Diagnosis not present

## 2022-05-08 DIAGNOSIS — N1831 Chronic kidney disease, stage 3a: Secondary | ICD-10-CM | POA: Diagnosis not present

## 2022-05-08 DIAGNOSIS — E785 Hyperlipidemia, unspecified: Secondary | ICD-10-CM | POA: Diagnosis not present

## 2022-05-08 DIAGNOSIS — I5022 Chronic systolic (congestive) heart failure: Secondary | ICD-10-CM

## 2022-05-08 DIAGNOSIS — F39 Unspecified mood [affective] disorder: Secondary | ICD-10-CM

## 2022-05-08 DIAGNOSIS — R413 Other amnesia: Secondary | ICD-10-CM

## 2022-05-08 DIAGNOSIS — I2510 Atherosclerotic heart disease of native coronary artery without angina pectoris: Secondary | ICD-10-CM

## 2022-05-08 MED ORDER — SPIRONOLACTONE 25 MG PO TABS: 12.5000 mg | ORAL_TABLET | Freq: Every day | ORAL | 1 refills | Status: DC

## 2022-05-08 MED ORDER — FUROSEMIDE 40 MG PO TABS: 40.0000 mg | ORAL_TABLET | Freq: Every day | ORAL | 3 refills | Status: DC

## 2022-05-08 NOTE — Patient Instructions (Signed)
Patient should not be driving anymore because of age and memory and health risk factors

## 2022-05-08 NOTE — Progress Notes (Signed)
BP (!) 145/75   Pulse 75   Temp 97.7 F (36.5 C)   Ht 5' 7"$  (1.702 m)   Wt 127 lb (57.6 kg)   SpO2 93%   BMI 19.89 kg/m    Subjective:   Patient ID: [name] [name], male    DOB: 02/08/1931, 89 y.o.   MRN: [id number]  HPI: [name] [name] is a 89 y.o. male presenting on 05/08/2022 for Medical Management of Chronic Issues, Hypertension, Hyperlipidemia, and Dementia (Becoming very forgetful and does not want to do anything per daughter)   HPI Hypertension and CHF and CAD Patient is currently on Imdur and furosemide and spironolactone, and their blood pressure today is 145/75. Patient denies any lightheadedness or dizziness. Patient denies headaches, blurred vision, chest pains, shortness of breath, or weakness. Denies any side effects from medication and is content with current medication.   Hyperlipidemia Patient is coming in for recheck of his hyperlipidemia. The patient is currently taking fish oil. They deny any issues with myalgias or history of liver damage from it. They deny any focal numbness or weakness or chest pain.   COPD Patient is coming in for COPD recheck today.  He is currently on no medicine currently.  He has a mild chronic cough but denies any major coughing spells or wheezing spells.  He has 0 nighttime symptoms per week and 0 daytime symptoms per week currently.   Memory issues Patient has been having memory and mood issues.  He says he gets frustrated with not being able to do the things that he normally does.  He does admit and his wife also says that his memory is worsening.  He also admits that he gets frustrated or anxious when he cannot do the things that he used to be able to do.  He says is not bad enough I want him to walk medicine at this point.  Relevant past medical, surgical, family and social history reviewed and updated as indicated. Interim medical history since our last visit reviewed. Allergies and medications reviewed and updated.  Review of  Systems  Constitutional:  Negative for chills and fever.  Eyes:  Negative for visual disturbance.  Respiratory:  Negative for shortness of breath and wheezing.   Cardiovascular:  Negative for chest pain and leg swelling.  Skin:  Negative for rash.  Neurological:  Negative for dizziness, weakness and light-headedness.  Psychiatric/Behavioral:  Positive for confusion and decreased concentration. Negative for self-injury, sleep disturbance and suicidal ideas. The patient is nervous/anxious.   All other systems reviewed and are negative.   Per HPI unless specifically indicated above   Allergies as of 05/08/2022       Reactions   Bee Pollen    Pollen Extract    Ramipril Hives, Swelling   Crestor [rosuvastatin] Anxiety        Medication List        Accurate as of May 08, 2022 11:26 AM. If you have any questions, ask your nurse or doctor.          aspirin 81 MG tablet Take 81 mg by mouth daily.   Fish Oil 1000 MG Cpdr Take 1 capsule by mouth daily.   furosemide 40 MG tablet Commonly known as: LASIX Take 1 tablet (40 mg total) by mouth daily.   Ginseng 250 MG Caps Take 1 capsule by mouth daily.   isosorbide mononitrate 30 MG 24 hr tablet Commonly known as: IMDUR TAKE ONE TABLET EVERY MORNING   multivitamin with  minerals tablet Take 1 tablet by mouth daily.   nitroGLYCERIN 0.4 MG SL tablet Commonly known as: NITROSTAT Place 1 tablet under tongue every 5 minutes as needed for chest pain.   spironolactone 25 MG tablet Commonly known as: ALDACTONE Take 0.5 tablets (12.5 mg total) by mouth daily.   zinc gluconate 50 MG tablet Take 50 mg by mouth daily.         Objective:   BP (!) 145/75   Pulse 75   Temp 97.7 F (36.5 C)   Ht 5' 7"$  (1.702 m)   Wt 127 lb (57.6 kg)   SpO2 93%   BMI 19.89 kg/m   Wt Readings from Last 3 Encounters:  05/08/22 127 lb (57.6 kg)  11/05/21 131 lb (59.4 kg)  07/06/21 137 lb 6.4 oz (62.3 kg)    Physical Exam Vitals  and nursing note reviewed.  Constitutional:      General: He is not in acute distress.    Appearance: He is well-developed. He is not diaphoretic.  Eyes:     General: No scleral icterus.    Conjunctiva/sclera: Conjunctivae normal.  Neck:     Thyroid: No thyromegaly.  Cardiovascular:     Rate and Rhythm: Normal rate and regular rhythm.     Heart sounds: Normal heart sounds. No murmur heard. Pulmonary:     Effort: Pulmonary effort is normal. No respiratory distress.     Breath sounds: Normal breath sounds. No wheezing.  Musculoskeletal:        General: No swelling. Normal range of motion.     Cervical back: Neck supple.  Lymphadenopathy:     Cervical: No cervical adenopathy.  Skin:    General: Skin is warm and dry.     Findings: No rash.  Neurological:     Mental Status: He is alert and oriented to person, place, and time.     Coordination: Coordination normal.  Psychiatric:        Mood and Affect: Affect normal. Mood is anxious and depressed.        Behavior: Behavior normal.        Cognition and Memory: He exhibits impaired recent memory.        Judgment: Judgment normal.       Assessment & Plan:   Problem List Items Addressed This Visit       Cardiovascular and Mediastinum   Essential hypertension   Relevant Medications   furosemide (LASIX) 40 MG tablet   spironolactone (ALDACTONE) 25 MG tablet   Other Relevant Orders   CBC with Differential/Platelet   0000000   SYSTOLIC HEART FAILURE, CHRONIC   Relevant Medications   furosemide (LASIX) 40 MG tablet   spironolactone (ALDACTONE) 25 MG tablet   CAD (coronary artery disease)   Relevant Medications   furosemide (LASIX) 40 MG tablet   spironolactone (ALDACTONE) 25 MG tablet     Respiratory   COPD (chronic obstructive pulmonary disease) (HCC)     Genitourinary   CKD (chronic kidney disease), stage III (HCC)   Relevant Orders   CBC with Differential/Platelet   CMP14+EGFR     Other   Hyperlipidemia LDL  goal <100 - Primary   Relevant Medications   furosemide (LASIX) 40 MG tablet   spironolactone (ALDACTONE) 25 MG tablet   Other Relevant Orders   CBC with Differential/Platelet   CMP14+EGFR   Lipid panel   Other Visit Diagnoses     Memory dysfunction       Mood disorder ([place])  Recommended because of his memory issues and age that he do not be driving anymore.  Discussed possible medications to help with anxiety and forgetfulness and he declined.  Will do blood work today.  Continue to monitor blood pressure. Follow up plan: Return in about 6 months (around 11/06/2022), or if symptoms worsen or fail to improve, for Hypertension and cholesterol and CHF.  Counseling provided for all of the vaccine components Orders Placed This Encounter  Procedures   CBC with Differential/Platelet   CMP14+EGFR   Lipid panel    Caryl Pina, MD [facility] 05/08/2022, 11:26 AM

## 2022-05-09 LAB — LIPID PANEL
Chol/HDL Ratio: 5 ratio (ref 0.0–5.0)
Cholesterol, Total: 296 mg/dL — ABNORMAL HIGH (ref 100–199)
HDL: 59 mg/dL
LDL Chol Calc (NIH): 214 mg/dL — ABNORMAL HIGH (ref 0–99)
Triglycerides: 128 mg/dL (ref 0–149)
VLDL Cholesterol Cal: 23 mg/dL (ref 5–40)

## 2022-05-09 LAB — CBC WITH DIFFERENTIAL/PLATELET
Basophils Absolute: 0.1 10*3/uL (ref 0.0–0.2)
Basos: 1 %
EOS (ABSOLUTE): 0.1 10*3/uL (ref 0.0–0.4)
Eos: 2 %
Hematocrit: 47.4 % (ref 37.5–51.0)
Hemoglobin: 15.7 g/dL (ref 13.0–17.7)
Immature Grans (Abs): 0 10*3/uL (ref 0.0–0.1)
Immature Granulocytes: 0 %
Lymphocytes Absolute: 2.2 10*3/uL (ref 0.7–3.1)
Lymphs: 29 %
MCH: 30 pg (ref 26.6–33.0)
MCHC: 33.1 g/dL (ref 31.5–35.7)
MCV: 91 fL (ref 79–97)
Monocytes Absolute: 0.5 10*3/uL (ref 0.1–0.9)
Monocytes: 6 %
Neutrophils Absolute: 4.9 10*3/uL (ref 1.4–7.0)
Neutrophils: 62 %
Platelets: 180 10*3/uL (ref 150–450)
RBC: 5.23 x10E6/uL (ref 4.14–5.80)
RDW: 13.5 % (ref 11.6–15.4)
WBC: 7.8 10*3/uL (ref 3.4–10.8)

## 2022-05-09 LAB — CMP14+EGFR
ALT: 8 IU/L (ref 0–44)
AST: 14 IU/L (ref 0–40)
Albumin/Globulin Ratio: 1.9 (ref 1.2–2.2)
Albumin: 4.5 g/dL (ref 3.6–4.6)
Alkaline Phosphatase: 55 IU/L (ref 44–121)
BUN/Creatinine Ratio: 13 (ref 10–24)
BUN: 16 mg/dL (ref 10–36)
Bilirubin Total: 0.6 mg/dL (ref 0.0–1.2)
CO2: 24 mmol/L (ref 20–29)
Calcium: 10.1 mg/dL (ref 8.6–10.2)
Chloride: 101 mmol/L (ref 96–106)
Creatinine, Ser: 1.23 mg/dL (ref 0.76–1.27)
Globulin, Total: 2.4 g/dL (ref 1.5–4.5)
Glucose: 103 mg/dL — ABNORMAL HIGH (ref 70–99)
Potassium: 4.6 mmol/L (ref 3.5–5.2)
Sodium: 141 mmol/L (ref 134–144)
Total Protein: 6.9 g/dL (ref 6.0–8.5)
eGFR: 56 mL/min/{1.73_m2} — ABNORMAL LOW (ref >59)

## 2022-05-13 ENCOUNTER — Ambulatory Visit: Payer: Self-pay

## 2022-05-13 DIAGNOSIS — I429 Cardiomyopathy, unspecified: Secondary | ICD-10-CM

## 2022-05-13 DIAGNOSIS — I5022 Chronic systolic (congestive) heart failure: Secondary | ICD-10-CM

## 2022-05-13 LAB — CUP PACEART REMOTE DEVICE CHECK
Battery Remaining Longevity: 2 mo
Battery Voltage: 2.79 V
Brady Statistic AP VP Percent: 31.13 %
Brady Statistic AP VS Percent: 0.02 %
Brady Statistic AS VP Percent: 67.42 %
Brady Statistic AS VS Percent: 1.43 %
Brady Statistic RA Percent Paced: 28.88 %
Brady Statistic RV Percent Paced: 91.11 %
Date Time Interrogation Session: 20240219022823
HighPow Impedance: 45 Ohm
HighPow Impedance: 64 Ohm
Implantable Lead Connection Status: 753985
Implantable Lead Connection Status: 753985
Implantable Lead Connection Status: 753985
Implantable Lead Implant Date: 20010823
Implantable Lead Implant Date: 20010823
Implantable Lead Implant Date: 20091119
Implantable Lead Location: 753858
Implantable Lead Location: 753859
Implantable Lead Location: 753860
Implantable Lead Model: 148
Implantable Lead Model: 6940
Implantable Lead Serial Number: 110961
Implantable Pulse Generator Implant Date: 20150921
Lead Channel Impedance Value: 304 Ohm
Lead Channel Impedance Value: 399 Ohm
Lead Channel Impedance Value: 456 Ohm
Lead Channel Impedance Value: 494 Ohm
Lead Channel Impedance Value: 589 Ohm
Lead Channel Impedance Value: 950 Ohm
Lead Channel Pacing Threshold Amplitude: 0.75 V
Lead Channel Pacing Threshold Amplitude: 0.75 V
Lead Channel Pacing Threshold Amplitude: 2.5 V
Lead Channel Pacing Threshold Pulse Width: 0.4 ms
Lead Channel Pacing Threshold Pulse Width: 0.4 ms
Lead Channel Pacing Threshold Pulse Width: 0.4 ms
Lead Channel Sensing Intrinsic Amplitude: 0.875 mV
Lead Channel Sensing Intrinsic Amplitude: 0.875 mV
Lead Channel Sensing Intrinsic Amplitude: 4 mV
Lead Channel Sensing Intrinsic Amplitude: 4 mV
Lead Channel Setting Pacing Amplitude: 0.5 V
Lead Channel Setting Pacing Amplitude: 1.75 V
Lead Channel Setting Pacing Amplitude: 2.5 V
Lead Channel Setting Pacing Pulse Width: 0.03 ms
Lead Channel Setting Pacing Pulse Width: 0.4 ms
Lead Channel Setting Sensing Sensitivity: 0.3 mV
Zone Setting Status: 755011
Zone Setting Status: 755011

## 2022-05-27 ENCOUNTER — Ambulatory Visit: Payer: Self-pay | Attending: Internal Medicine

## 2022-05-27 DIAGNOSIS — Z95810 Presence of automatic (implantable) cardiac defibrillator: Secondary | ICD-10-CM

## 2022-05-27 DIAGNOSIS — I5022 Chronic systolic (congestive) heart failure: Secondary | ICD-10-CM

## 2022-05-30 ENCOUNTER — Telehealth: Payer: Self-pay

## 2022-05-30 NOTE — Telephone Encounter (Signed)
Remote ICM transmission received.  Attempted call to patient regarding ICM remote transmission and left detailed message per DPR.  Advised to return call for any fluid symptoms or questions. Next ICM remote transmission scheduled 07/01/2022.

## 2022-05-30 NOTE — Progress Notes (Signed)
EPIC Encounter for ICM Monitoring  Patient Name: [name] [name] is a 89 y.o. male Date: 05/30/2022 Primary Care Physican: Dettinger, Fransisca Kaufmann, MD Primary Cardiologist: Caryl Comes Electrophysiologist: Vergie Living Pacing:  92%    08/24/2021 Weight: 145 lb 10/10/2021 Weight: 140 lbs 03/11/2022 Weight: not weighing   Since 13-May-2022 Time in AT/AF   0.0 hr/day (0.0%)   Battery Longevity: 1 month   Attempted call to patient and unable to reach.  Left detailed message per DPR regarding transmission. Transmission reviewed.      Optivol Thoracic impedance suggesting normal fluid levels.   Prescribed:  Furosemide 40 mg 1 tablet by mouth daily Spironolactone 25 mg take 0.5 tablet (12.5 mg total) by mouth daily   Labs: 11/05/2021 Creatinine 1.39, BUN 17, Potassium 4.3, Sodium 141, GFR 48 05/21/2021 Creatinine 1.31, BUN 15, Potassium 4.6, Sodium 136, GFR 52 A complete set of results can be found in Results Review.   Recommendations:   Left voice mail with ICM number and encouraged to call if experiencing any fluid symptoms.   Follow-up plan: ICM clinic phone appointment on 07/01/2022.  91 day device clinic remote transmission 07/10/2022.       EP/Cardiology Office Visits:  Recall 07/01/2022 with Dr. Caryl Comes.    Copy of ICM check sent to Dr. Caryl Comes.   3 month ICM trend: 05/27/2022.    12-14 Month ICM trend:     [name], RN 05/30/2022 12:10 PM

## 2022-06-13 ENCOUNTER — Ambulatory Visit (INDEPENDENT_AMBULATORY_CARE_PROVIDER_SITE_OTHER): Payer: Medicare Other

## 2022-06-13 DIAGNOSIS — I255 Ischemic cardiomyopathy: Secondary | ICD-10-CM

## 2022-06-13 LAB — CUP PACEART REMOTE DEVICE CHECK
Battery Remaining Longevity: 1 mo
Battery Voltage: 2.78 V
Brady Statistic AP VP Percent: 21.65 %
Brady Statistic AP VS Percent: 0.02 %
Brady Statistic AS VP Percent: 77.04 %
Brady Statistic AS VS Percent: 1.3 %
Brady Statistic RA Percent Paced: 20.22 %
Brady Statistic RV Percent Paced: 90.49 %
Date Time Interrogation Session: 20240321012302
HighPow Impedance: 50 Ohm
HighPow Impedance: 72 Ohm
Implantable Lead Connection Status: 753985
Implantable Lead Connection Status: 753985
Implantable Lead Connection Status: 753985
Implantable Lead Implant Date: 20010823
Implantable Lead Implant Date: 20010823
Implantable Lead Implant Date: 20091119
Implantable Lead Location: 753858
Implantable Lead Location: 753859
Implantable Lead Location: 753860
Implantable Lead Model: 148
Implantable Lead Model: 6940
Implantable Lead Serial Number: 110961
Implantable Pulse Generator Implant Date: 20150921
Lead Channel Impedance Value: 304 Ohm
Lead Channel Impedance Value: 437 Ohm
Lead Channel Impedance Value: 456 Ohm
Lead Channel Impedance Value: 513 Ohm
Lead Channel Impedance Value: 646 Ohm
Lead Channel Impedance Value: 988 Ohm
Lead Channel Pacing Threshold Amplitude: 0.625 V
Lead Channel Pacing Threshold Amplitude: 0.75 V
Lead Channel Pacing Threshold Amplitude: 2.5 V
Lead Channel Pacing Threshold Pulse Width: 0.4 ms
Lead Channel Pacing Threshold Pulse Width: 0.4 ms
Lead Channel Pacing Threshold Pulse Width: 0.4 ms
Lead Channel Sensing Intrinsic Amplitude: 3 mV
Lead Channel Sensing Intrinsic Amplitude: 3 mV
Lead Channel Sensing Intrinsic Amplitude: 4.5 mV
Lead Channel Sensing Intrinsic Amplitude: 4.5 mV
Lead Channel Setting Pacing Amplitude: 0.5 V
Lead Channel Setting Pacing Amplitude: 1.75 V
Lead Channel Setting Pacing Amplitude: 2.5 V
Lead Channel Setting Pacing Pulse Width: 0.03 ms
Lead Channel Setting Pacing Pulse Width: 0.4 ms
Lead Channel Setting Sensing Sensitivity: 0.3 mV
Zone Setting Status: 755011
Zone Setting Status: 755011

## 2022-06-24 NOTE — Progress Notes (Signed)
Remote ICD transmission.   

## 2022-07-01 ENCOUNTER — Ambulatory Visit: Payer: Medicare Other | Attending: Internal Medicine

## 2022-07-01 DIAGNOSIS — I493 Ventricular premature depolarization: Secondary | ICD-10-CM | POA: Insufficient documentation

## 2022-07-01 DIAGNOSIS — Z95810 Presence of automatic (implantable) cardiac defibrillator: Secondary | ICD-10-CM

## 2022-07-01 DIAGNOSIS — I5022 Chronic systolic (congestive) heart failure: Secondary | ICD-10-CM

## 2022-07-02 ENCOUNTER — Other Ambulatory Visit: Payer: Self-pay | Admitting: Internal Medicine

## 2022-07-03 ENCOUNTER — Telehealth: Payer: Self-pay

## 2022-07-03 NOTE — Telephone Encounter (Signed)
Remote ICM transmission received.  Attempted call to patient regarding ICM remote transmission and no answer or voice mail option.  

## 2022-07-03 NOTE — Progress Notes (Signed)
EPIC Encounter for ICM Monitoring  Patient Name: [name] [name] is a 89 y.o. male Date: 07/03/2022 Primary Care Physican: Dettinger, Elige Radon, MD Primary Cardiologist: Graciela Husbands Electrophysiologist: Joycelyn Schmid Pacing:  90.8%    08/24/2021 Weight: 145 lb 10/10/2021 Weight: 140 lbs 03/11/2022 Weight: not weighing   Since 13-Jun-2022 Time in AT/AF   <0.1 hr/day (<0.1%)   Battery Longevity: 1 month  Near [place]   Attempted call to patient and unable to reach.  Left detailed message per DPR regarding transmission. Transmission reviewed.      Optivol Thoracic impedance suggesting normal fluid levels.   Prescribed:  Furosemide 40 mg 1 tablet by mouth daily Spironolactone 25 mg take 0.5 tablet (12.5 mg total) by mouth daily   Labs: 11/05/2021 Creatinine 1.39, BUN 17, Potassium 4.3, Sodium 141, GFR 48 05/21/2021 Creatinine 1.31, BUN 15, Potassium 4.6, Sodium 136, GFR 52 A complete set of results can be found in Results Review.   Recommendations:   Left voice mail with ICM number and encouraged to call if experiencing any fluid symptoms.   Follow-up plan: ICM clinic phone appointment on 08/05/2022.  91 day device clinic remote transmission 07/10/2022.       EP/Cardiology Office Visits:  07/04/2022 with Dr. Graciela Husbands.    Copy of ICM check sent to Dr. Graciela Husbands.    3 month ICM trend: 07/01/2022.    12-14 Month ICM trend:     Karie Soda, RN 07/03/2022 10:19 AM

## 2022-07-04 ENCOUNTER — Ambulatory Visit: Payer: Medicare Other | Attending: Internal Medicine | Admitting: Internal Medicine

## 2022-07-04 ENCOUNTER — Encounter: Payer: Self-pay | Admitting: Internal Medicine

## 2022-07-04 VITALS — BP 112/70 | HR 71 | Ht 67.0 in | Wt 127.8 lb

## 2022-07-04 DIAGNOSIS — Z95810 Presence of automatic (implantable) cardiac defibrillator: Secondary | ICD-10-CM

## 2022-07-04 DIAGNOSIS — I5022 Chronic systolic (congestive) heart failure: Secondary | ICD-10-CM

## 2022-07-04 DIAGNOSIS — I493 Ventricular premature depolarization: Secondary | ICD-10-CM

## 2022-07-04 DIAGNOSIS — I255 Ischemic cardiomyopathy: Secondary | ICD-10-CM

## 2022-07-04 NOTE — Progress Notes (Addendum)
Patient Care Team: Dettinger, Elige Radon, MD as PCP - General (Family Medicine)   HPI  [name] [name] is a 89 y.o. male seen in followup for Ischemic cardiomyopathy congestive heart failure for which he is s/p CRT-D implantation.  Device generator placement 9/15 and maintaining of CRT-D.  Status post AV junction ablation.  Frequent PVCs previously on amiodarone now discontinued  Prior CABG    Device is programmed to subthreshold RV pacing output.  This dates back to December 2017.  Looking back at the notes they are mine and unfortunately incomplete.  There was a high RV pacing output of 3.5 V.  I presume it was done to allow for left ventricular only pacing.  This was done 9/17 Stable low amplitude R waves    The patient denies chest pain, nocturnal dyspnea, orthopnea or peripheral edema.  There have been no palpitations, lightheadedness or syncope.  Complains of chronic shortness of breath.  Progressive dementia.Marland Kitchen      DATE TEST EF   7/11 LHC  40 % SVG-RCA-T; SVG-D1-T  9/17 Echo   40-45 %   4/22 ,MYOVIEW  <30% No ischemia/SCAR  5/22 Echo  30-35% Aneurysmal inferior wall       Date Cr K TSH Hgb  3/15    4.9     7/18  1.85 4.7 2.62   1/19 1.4 4.8    9/20 1.37 4.4    2/22 1.62 4.3    2/23 1.31 4.6  15.5  2/24 1.23 4.6  15.7       Past Surgical History:  Procedure Laterality Date   BI-VENTRICULAR IMPLANTABLE CARDIOVERTER DEFIBRILLATOR  (CRT-D)  2005; 2015   STJ CRTD implanted by Dr Graciela Husbands   BI-VENTRICULAR IMPLANTABLE CARDIOVERTER DEFIBRILLATOR UPGRADE N/A 12/13/2013   Procedure: BI-VENTRICULAR IMPLANTABLE CARDIOVERTER DEFIBRILLATOR UPGRADE;  Surgeon: Duke Salvia, MD;  Location: [facility] CATH LAB;  Service: Cardiovascular;  Laterality: N/A;   CORONARY ARTERY BYPASS GRAFT  1989    Current Outpatient Medications  Medication Sig Dispense Refill   aspirin 81 MG tablet Take 81 mg by mouth daily.     furosemide (LASIX) 40 MG tablet Take 1 tablet (40 mg total) by mouth daily.  90 tablet 3   Ginseng 250 MG CAPS Take 1 capsule by mouth daily.     isosorbide mononitrate (IMDUR) 30 MG 24 hr tablet TAKE ONE TABLET EVERY MORNING 90 tablet 2   Multiple Vitamins-Minerals (MULTIVITAMIN WITH MINERALS) tablet Take 1 tablet by mouth daily.     nitroGLYCERIN (NITROSTAT) 0.4 MG SL tablet Place 1 tablet under tongue every 5 minutes as needed for chest pain. 25 tablet 0   Omega-3 Fatty Acids (FISH OIL) 1000 MG CPDR Take 1 capsule by mouth daily.      spironolactone (ALDACTONE) 25 MG tablet Take 0.5 tablets (12.5 mg total) by mouth daily. 45 tablet 1   zinc gluconate 50 MG tablet Take 50 mg by mouth daily.     No current facility-administered medications for this visit.    Allergies  Allergen Reactions   Bee Pollen    Pollen Extract    Ramipril Hives and Swelling   Crestor [Rosuvastatin] Anxiety    Review of Systems negative except from HPI and PMH  Physical Exam BP 112/70   Pulse 71   Ht 5\' 7"  (1.702 m)   Wt 127 lb 12.8 oz (58 kg)   SpO2 90%   BMI 20.02 kg/m  Well developed and well nourished in no  acute distress HENT normal temporal wasting Neck supple with JVP-flat Clear Device pocket well healed; without hematoma or erythema.  There is no tethering  Regular rate and rhythm, no  gallop No  murmur Abd-soft with active BS No Clubbing cyanosis  edema Skin-warm and dry multiple lesions. On the right lower leg there is a nontender nonerythematous soft mass about 3 cm in diameter A & Oriented  Grossly normal sensory and motor function  ECG    Device function is normal. Programming changes approaching ERI See Paceart for details    Assessment and  Plan  Ischemic cardiomyopathy s/p CABG    Congestive heart failure-chronic-systolic  CRT-D-     Dementia  End-of-life discussion  PVCs  ACE/ARB/ASNI contraindicated secondary to hives on ramipril  Leg mass     Euvolemic.  No symptoms of ischemia continue aspirin isosorbide  Lengthy discussion  regarding his device.  He is approaching ERI.  With his dementia which is quite notable, will not remember tomorrow today, reviewed the role of downgrade of his CRT-D to CRT-P at the time of generator replacement which is coming in the next number of months.  He is not well able to make this decision.  I reviewed with his wife we will plan to review it with his children to decide on how best to proceed.  My recommendation in light of his age, no prior therapies, skin wasting and a tight pocket has been that we would not replace high-voltage therapy  Leg mass on the right leg, none tender not erythematous not warm.  I wonder whether it is a periosteal hematoma from a fall.  I worry about the possibility of encapsulated infection with erosion into his bone and the presence of the device this needs to be   evaluated

## 2022-07-04 NOTE — Patient Instructions (Signed)
Medication Instructions:  Your physician recommends that you continue on your current medications as directed. Please refer to the Current Medication list given to you today.  *If you need a refill on your cardiac medications before your next appointment, please call your pharmacy*   Lab Work: None ordered.  If you have labs (blood work) drawn today and your tests are completely normal, you will receive your results only by: MyChart Message (if you have MyChart) OR A paper copy in the mail If you have any lab test that is abnormal or we need to change your treatment, we will call you to review the results.   Testing/Procedures: None ordered.    Follow-Up: At [facility], you and your health needs are our priority.  As part of our continuing mission to provide you with exceptional heart care, we have created designated Provider Care Teams.  These Care Teams include your primary Cardiologist (physician) and Advanced Practice Providers (APPs -  Physician Assistants and Nurse Practitioners) who all work together to provide you with the care you need, when you need it.  We recommend signing up for the patient portal called "MyChart".  Sign up information is provided on this After Visit Summary.  MyChart is used to connect with patients for Virtual Visits (Telemedicine).  Patients are able to view lab/test results, encounter notes, upcoming appointments, etc.  Non-urgent messages can be sent to your provider as well.   To learn more about what you can do with MyChart, go to [email or web address].    Your next appointment:    3 months with Dr Graciela Husbands  Please follow up with your PCP and [facility] as discussed by Dr Graciela Husbands

## 2022-07-08 ENCOUNTER — Telehealth: Payer: Self-pay | Admitting: Internal Medicine

## 2022-07-08 DIAGNOSIS — S8011XA Contusion of right lower leg, initial encounter: Secondary | ICD-10-CM

## 2022-07-08 NOTE — Telephone Encounter (Signed)
  Pt's daughter Winn Jock called, she is following up the referral they requested from Dr. Graciela Husbands to send to wound care center at [facility]

## 2022-07-09 NOTE — Telephone Encounter (Signed)
Spoke with pt's daughter, DPR and advised referral placed as requested per Dr Odessa Fleming recommendation.  Pt's daughter verbalizes understanding and thanked Charity fundraiser for the call.

## 2022-07-09 NOTE — Addendum Note (Signed)
Addended by: Alois Cliche on: 07/09/2022 09:50 AM   Modules accepted: Orders

## 2022-07-09 NOTE — Telephone Encounter (Signed)
Attempted phone call to pt's DPR, Kathi and left voicemail to contact RN at [phone].

## 2022-07-15 ENCOUNTER — Ambulatory Visit: Payer: Self-pay

## 2022-07-17 NOTE — Progress Notes (Signed)
Remote ICD transmission.   

## 2022-07-25 DIAGNOSIS — S8011XA Contusion of right lower leg, initial encounter: Secondary | ICD-10-CM | POA: Diagnosis not present

## 2022-08-05 ENCOUNTER — Ambulatory Visit: Payer: Medicare Other | Attending: Internal Medicine

## 2022-08-05 DIAGNOSIS — I5022 Chronic systolic (congestive) heart failure: Secondary | ICD-10-CM | POA: Diagnosis not present

## 2022-08-05 DIAGNOSIS — Z95810 Presence of automatic (implantable) cardiac defibrillator: Secondary | ICD-10-CM

## 2022-08-09 NOTE — Progress Notes (Signed)
EPIC Encounter for ICM Monitoring  Patient Name: [name] [name] is a 89 y.o. male Date: 08/09/2022 Primary Care Physican: Dettinger, Elige Radon, MD Primary Cardiologist: Graciela Husbands Electrophysiologist: Joycelyn Schmid Pacing:  90.8%    08/24/2021 Weight: 145 lb 10/10/2021 Weight: 140 lbs 03/11/2022 Weight: not weighing   Since 22-Apr-202 Time in AT/AF   0.0 hr/day (0.0%)   Battery Longevity: 1 month  Near ERI    Spoke with patient and heart failure questions reviewed.  Transmission results reviewed.  Pt asymptomatic for fluid accumulation.  Reports feeling well at this time and voices no complaints.  Reminded the device alert will occur within the next month and then will have 3 months to get the procedure scheduled for replacement.    Optivol Thoracic impedance suggesting intermittent days with possible fluid accumulation within the last month.   Prescribed:  Furosemide 40 mg 1 tablet by mouth daily Spironolactone 25 mg take 0.5 tablet (12.5 mg total) by mouth daily   Labs: 05/08/2022 Creatinine 1.23, BUN 16, Potassium 4.6, Sodium 141, GFR 56 A complete set of results can be found in Results Review.   Recommendations:   No changes and encouraged to call if experiencing any fluid symptoms.   Follow-up plan: ICM clinic phone appointment on 09/09/2022.  91 day device clinic remote transmission 08/20/2022.       EP/Cardiology Office Visits:  10/03/2022 with Dr. Graciela Husbands.    Copy of ICM check sent to Dr. Graciela Husbands.   3 month ICM trend: 08/05/2022.    12-14 Month ICM trend:     Karie Soda, RN 08/09/2022 11:44 AM

## 2022-08-20 ENCOUNTER — Telehealth: Payer: Self-pay

## 2022-08-20 ENCOUNTER — Ambulatory Visit: Payer: Self-pay

## 2022-08-20 NOTE — Telephone Encounter (Signed)
Alert received from [employer]:  Device alert for RRT reached 5/25 - route to triage 2 NSVT, no therapy

## 2022-08-20 NOTE — Telephone Encounter (Signed)
Mindi Junker is checking with Dr. Graciela Husbands about what to do about gen change and apt d/t patients hx of dementia.

## 2022-08-22 NOTE — Telephone Encounter (Signed)
Spoke with pt''s step daughter, Winn Jock, [place] who states pt's device is alarming daily.  Advised device is now at replacement recommendation.  Alarm can be turned off.  Pt's step daughter states pt's wife still not certain how to proceed.  Reviewed Dr Odessa Fleming last OV note (4/24) with her and his recommendation.  Winn Jock states she will discuss again with pt's wife and will contact office next week with decision.

## 2022-08-29 NOTE — Telephone Encounter (Signed)
Patient's wife is following up and states patient's device has stopped beeping. She is requesting to speak with Mindi Junker again to discuss further.

## 2022-08-29 NOTE — Telephone Encounter (Signed)
Returned call to Natalia Leatherwood (patient's wife), shared with her that Mindi Junker is out of the office until tomorrow.  Call disconnected, unable to reach when dialed again.  Called and spoke with patient's step daughter Winn Jock and explained the above to her.  Winn Jock states patient has dementia, but has consistently states he does not want to have any surgery for his pacemaker. She states patient's son was supposed to come by and speak with patient and spouse to discuss the situation and be sure everyone is on the same page with patient's decision.  Will forward message to Mindi Junker to follow-up with patient's spouse Natalia Leatherwood.

## 2022-09-04 ENCOUNTER — Other Ambulatory Visit: Payer: Self-pay | Admitting: Internal Medicine

## 2022-09-04 NOTE — Telephone Encounter (Signed)
Spoke with pt's wife,DPR who reports she still does not have an answer for proceeding with generator change out.  She states pt is adamant that he "does not want to be cut on."  Pt's wife states pt's children have talked to pt as well.  Pt is scheduled to see Dr Graciela Husbands again on 10/03/2022 and will try to have pt's children attend appointment as well.Memory Argue sooner telephone appointment with Dr Graciela Husbands but declined.  Pt reached RRT on 08/17/2022.  Device clinic RN, Marliss Czar states pt is not device dependant.  Pt's wife advised if a decision is made sooner that 10/03/2022 to please notify RN as soon as possible.  Pt's wife verbalizes understanding and thanked Charity fundraiser for the call.

## 2022-09-09 ENCOUNTER — Ambulatory Visit: Payer: Medicare Other | Attending: Internal Medicine

## 2022-09-09 DIAGNOSIS — I5022 Chronic systolic (congestive) heart failure: Secondary | ICD-10-CM | POA: Diagnosis not present

## 2022-09-09 DIAGNOSIS — Z95810 Presence of automatic (implantable) cardiac defibrillator: Secondary | ICD-10-CM | POA: Diagnosis not present

## 2022-09-13 ENCOUNTER — Telehealth: Payer: Self-pay

## 2022-09-13 NOTE — Telephone Encounter (Signed)
Remote ICM transmission received.  Attempted call to patient regarding ICM remote transmission and no answer.  

## 2022-09-13 NOTE — Progress Notes (Signed)
EPIC Encounter for ICM Monitoring  Patient Name: [name] [name] is a 89 y.o. male Date: 09/13/2022 Primary Care Physican: Dettinger, Elige Radon, MD Primary Cardiologist: Graciela Husbands Electrophysiologist: Joycelyn Schmid Pacing:  80.8%    08/24/2021 Weight: 145 lb 10/10/2021 Weight: 140 lbs 03/11/2022 Weight: not weighing   Since 17-Aug-2022 VT-NS (>4 beats, >162 bpm) 5 Time in AT/AF   0.0 hr/day (0.0%)   Battery Longevity:  Reached ERI on 5/25 and pt has declined replacement at this point but has f/u appt with Dr Graciela Husbands 10/03/2022 to discuss further   Attempted call to patient and unable to reach.  Transmission reviewed.    Optivol Thoracic impedance suggesting intermittent days with possible fluid accumulation within the last month.   Prescribed:  Furosemide 40 mg 1 tablet by mouth daily Spironolactone 25 mg take 0.5 tablet (12.5 mg total) by mouth daily   Labs: 05/08/2022 Creatinine 1.23, BUN 16, Potassium 4.6, Sodium 141, GFR 56 A complete set of results can be found in Results Review.   Recommendations:   Unable to reach.     Follow-up plan: ICM clinic phone appointment on 10/14/2022.  91 day device clinic remote transmission 11/18/2022.       EP/Cardiology Office Visits:  10/03/2022 with Dr. Graciela Husbands.    Copy of ICM check sent to Dr. Graciela Husbands.   3 month ICM trend: 09/09/2022.    12-14 Month ICM trend:     Karie Soda, RN 09/13/2022 12:41 PM

## 2022-10-02 NOTE — Telephone Encounter (Signed)
ntoed 

## 2022-10-03 ENCOUNTER — Encounter: Payer: Self-pay | Admitting: Internal Medicine

## 2022-10-03 ENCOUNTER — Telehealth: Payer: Self-pay

## 2022-10-03 ENCOUNTER — Ambulatory Visit: Payer: Medicare Other | Attending: Internal Medicine | Admitting: Internal Medicine

## 2022-10-03 VITALS — BP 120/74 | HR 70 | Ht 67.0 in | Wt 129.2 lb

## 2022-10-03 DIAGNOSIS — Z95810 Presence of automatic (implantable) cardiac defibrillator: Secondary | ICD-10-CM | POA: Diagnosis not present

## 2022-10-03 DIAGNOSIS — I255 Ischemic cardiomyopathy: Secondary | ICD-10-CM

## 2022-10-03 NOTE — Telephone Encounter (Signed)
Do not put patient on a remote schedule. ICD therapies disabled per Graciela Husbands. Pt no longer wants to be monitored.

## 2022-10-03 NOTE — Progress Notes (Signed)
Patient Care Team: Dettinger, Elige Radon, MD as PCP - General (Family Medicine)   HPI  [name] [name] is a 89 y.o. male seen in followup for Ischemic cardiomyopathy congestive heart failure for which he is s/p CRT-D implantation.  Device generator placement 9/15 and maintaining of CRT-D.  Status post AV junction ablation.  Frequent PVCs previously on amiodarone now discontinued  Prior CABG    Device is programmed to subthreshold RV pacing output.  This dates back to December 2017.  Looking back at the notes they are mine and unfortunately incomplete.  There was a high RV pacing threshold  of 3.5 V.  I presume it was done to allow for left ventricular only pacing.  This was done 9/17 Stable low amplitude R waves    Not device dependent >> AAI pacing with intrinsic conduction despite AV ablation   Progressive dementia Not sure he "wants to get cut on"  And is confident and repeats this  No chest pain  no dyspnea      DATE TEST EF   7/11 LHC  40 % SVG-RCA-T; SVG-D1-T  9/17 Echo   40-45 %   4/22 ,MYOVIEW  <30% No ischemia/SCAR  5/22 Echo  30-35% Aneurysmal inferior wall       Date Cr K TSH Hgb  3/15    4.9     7/18  1.85 4.7 2.62   1/19 1.4 4.8    9/20 1.37 4.4    2/22 1.62 4.3    2/23 1.31 4.6  15.5  2/24 1.23 4.6  15.7       Past Surgical History:  Procedure Laterality Date   BI-VENTRICULAR IMPLANTABLE CARDIOVERTER DEFIBRILLATOR  (CRT-D)  2005; 2015   STJ CRTD implanted by Dr Graciela Husbands   BI-VENTRICULAR IMPLANTABLE CARDIOVERTER DEFIBRILLATOR UPGRADE N/A 12/13/2013   Procedure: BI-VENTRICULAR IMPLANTABLE CARDIOVERTER DEFIBRILLATOR UPGRADE;  Surgeon: Duke Salvia, MD;  Location: [facility] CATH LAB;  Service: Cardiovascular;  Laterality: N/A;   CORONARY ARTERY BYPASS GRAFT  1989    Current Outpatient Medications  Medication Sig Dispense Refill   aspirin 81 MG tablet Take 81 mg by mouth daily.     furosemide (LASIX) 40 MG tablet Take 1 tablet (40 mg total) by mouth daily.  90 tablet 3   Ginseng 250 MG CAPS Take 1 capsule by mouth daily.     isosorbide mononitrate (IMDUR) 30 MG 24 hr tablet TAKE ONE TABLET EVERY MORNING 90 tablet 3   Multiple Vitamins-Minerals (MULTIVITAMIN WITH MINERALS) tablet Take 1 tablet by mouth daily.     nitroGLYCERIN (NITROSTAT) 0.4 MG SL tablet Place 1 tablet under tongue every 5 minutes as needed for chest pain. 25 tablet 0   Omega-3 Fatty Acids (FISH OIL) 1000 MG CPDR Take 1 capsule by mouth daily.      spironolactone (ALDACTONE) 25 MG tablet Take 0.5 tablets (12.5 mg total) by mouth daily. 45 tablet 1   zinc gluconate 50 MG tablet Take 50 mg by mouth daily.     No current facility-administered medications for this visit.    Allergies  Allergen Reactions   Bee Pollen    Pollen Extract    Ramipril Hives and Swelling   Crestor [Rosuvastatin] Anxiety    Review of Systems negative except from HPI and PMH  Physical Exam BP 120/74   Pulse 70   Ht 5\' 7"  (1.702 m)   Wt 129 lb 3.2 oz (58.6 kg)   SpO2 97%   BMI 20.24  kg/m  Well developed and well nourished in no acute distress HENT normal Neck supple with JVP-flat Clear Device pocket well healed; without hematoma or erythema.  There is no tethering  Regular rate and rhythm,  murmur Abd-soft with active BS No Clubbing cyanosis  edema Skin-warm and dry A & Oriented  Grossly normal sensory and motor function  ECG none   Device function is abnormal>> device at [employer] changes none  See Paceart for details    Assessment and  Plan  Ischemic cardiomyopathy s/p CABG    Congestive heart failure-chronic-systolic  CRT-D-     Dementia  End-of-life discussion  PVCs  ACE/ARB/ASNI contraindicated secondary to hives on ramipril    Device is at [facility]    he has elected to not to proceed with device generator replacement.  He also has elected to maintain high-voltage therapies on.  He understands that he could get shocked.  End of service behavior will be loss of  atrial capture for which she has underlying sinus rhythm at least based on his device check last fall he had intrinsic conduction with atrial pacing.  For his ischemic heart disease we will continue him on aspirin isosorbide and for his cardiomyopathy hypertension spironolactone he is euvolemic.

## 2022-10-03 NOTE — Patient Instructions (Signed)
Medication Instructions:  Your physician recommends that you continue on your current medications as directed. Please refer to the Current Medication list given to you today. *If you need a refill on your cardiac medications before your next appointment, please call your pharmacy*   Follow-Up: At [facility], you and your health needs are our priority.  As part of our continuing mission to provide you with exceptional heart care, we have created designated Provider Care Teams.  These Care Teams include your primary Cardiologist (physician) and Advanced Practice Providers (APPs -  Physician Assistants and Nurse Practitioners) who all work together to provide you with the care you need, when you need it.  We recommend signing up for the patient portal called "MyChart".  Sign up information is provided on this After Visit Summary.  MyChart is used to connect with patients for Virtual Visits (Telemedicine).  Patients are able to view lab/test results, encounter notes, upcoming appointments, etc.  Non-urgent messages can be sent to your provider as well.   To learn more about what you can do with MyChart, go to [email or web address].    Your next appointment:   As needed  Provider:   Sherryl Manges, MD

## 2022-10-15 NOTE — Progress Notes (Signed)
Pt has elected to stop monitoring per 7/11 device clinic note.  He will not have device battery replaced.  No further ICM monthly follow up scheduled.

## 2022-11-13 ENCOUNTER — Ambulatory Visit (INDEPENDENT_AMBULATORY_CARE_PROVIDER_SITE_OTHER): Payer: Medicare Other | Admitting: Family Medicine

## 2022-11-13 ENCOUNTER — Encounter: Payer: Self-pay | Admitting: Family Medicine

## 2022-11-13 VITALS — BP 128/74 | HR 60 | Ht 67.0 in | Wt 127.0 lb

## 2022-11-13 DIAGNOSIS — I130 Hypertensive heart and chronic kidney disease with heart failure and stage 1 through stage 4 chronic kidney disease, or unspecified chronic kidney disease: Secondary | ICD-10-CM

## 2022-11-13 DIAGNOSIS — J449 Chronic obstructive pulmonary disease, unspecified: Secondary | ICD-10-CM | POA: Diagnosis not present

## 2022-11-13 DIAGNOSIS — N1831 Chronic kidney disease, stage 3a: Secondary | ICD-10-CM

## 2022-11-13 DIAGNOSIS — E785 Hyperlipidemia, unspecified: Secondary | ICD-10-CM

## 2022-11-13 DIAGNOSIS — I10 Essential (primary) hypertension: Secondary | ICD-10-CM | POA: Diagnosis not present

## 2022-11-13 DIAGNOSIS — I2510 Atherosclerotic heart disease of native coronary artery without angina pectoris: Secondary | ICD-10-CM

## 2022-11-13 DIAGNOSIS — Z23 Encounter for immunization: Secondary | ICD-10-CM

## 2022-11-13 DIAGNOSIS — I5022 Chronic systolic (congestive) heart failure: Secondary | ICD-10-CM

## 2022-11-13 NOTE — Addendum Note (Signed)
Addended by: Dorene Sorrow on: 11/13/2022 02:38 PM   Modules accepted: Orders

## 2022-11-13 NOTE — Progress Notes (Signed)
BP 128/74   Pulse 60   Ht 5\' 7"  (1.702 m)   Wt 127 lb (57.6 kg)   SpO2 95%   BMI 19.89 kg/m    Subjective:   Patient ID: [name] [name], male    DOB: 1930-10-02, 89 y.o.   MRN: [id number]  HPI: [name] [name] is a 89 y.o. male presenting on 11/13/2022 for Medical Management of Chronic Issues, Hyperlipidemia, and Hypertension   HPI Hyperlipidemia and CAD and cardiomyopathy and CHF recheck Patient is coming in for recheck of his hyperlipidemia. The patient is currently taking fish oils. They deny any issues with myalgias or history of liver damage from it. They deny any focal numbness or weakness or chest pain.   Hypertension and CKD recheck Patient is currently on Imdur and furosemide spironolactone, and their blood pressure today is 128/74. Patient denies any lightheadedness or dizziness. Patient denies headaches, blurred vision, chest pains, shortness of breath, or weakness. Denies any side effects from medication and is content with current medication.   COPD Patient is coming in for COPD recheck today.  He is currently on no medicine.  He has a mild chronic cough but denies any major coughing spells or wheezing spells.  He has 0 nighttime symptoms per week and 0 daytime symptoms per week currently.   Relevant past medical, surgical, family and social history reviewed and updated as indicated. Interim medical history since our last visit reviewed. Allergies and medications reviewed and updated.  Review of Systems  Constitutional:  Negative for chills and fever.  Eyes:  Negative for visual disturbance.  Respiratory:  Negative for shortness of breath and wheezing.   Cardiovascular:  Negative for chest pain and leg swelling.  Musculoskeletal:  Negative for back pain and gait problem.  Skin:  Negative for rash.  Neurological:  Negative for dizziness, weakness and light-headedness.  All other systems reviewed and are negative.   Per HPI unless specifically indicated  above   Allergies as of 11/13/2022       Reactions   Bee Pollen    Pollen Extract    Ramipril Hives, Swelling   Crestor [rosuvastatin] Anxiety        Medication List        Accurate as of November 13, 2022  2:25 PM. If you have any questions, ask your nurse or doctor.          aspirin 81 MG tablet Take 81 mg by mouth daily.   Fish Oil 1000 MG Cpdr Take 1 capsule by mouth daily.   furosemide 40 MG tablet Commonly known as: LASIX Take 1 tablet (40 mg total) by mouth daily.   Ginseng 250 MG Caps Take 1 capsule by mouth daily.   isosorbide mononitrate 30 MG 24 hr tablet Commonly known as: IMDUR TAKE ONE TABLET EVERY MORNING   multivitamin with minerals tablet Take 1 tablet by mouth daily.   nitroGLYCERIN 0.4 MG SL tablet Commonly known as: NITROSTAT Place 1 tablet under tongue every 5 minutes as needed for chest pain.   spironolactone 25 MG tablet Commonly known as: ALDACTONE Take 0.5 tablets (12.5 mg total) by mouth daily.   zinc gluconate 50 MG tablet Take 50 mg by mouth daily.         Objective:   BP 128/74   Pulse 60   Ht 5\' 7"  (1.702 m)   Wt 127 lb (57.6 kg)   SpO2 95%   BMI 19.89 kg/m   Wt Readings from Last 3  Encounters:  11/13/22 127 lb (57.6 kg)  10/03/22 129 lb 3.2 oz (58.6 kg)  07/04/22 127 lb 12.8 oz (58 kg)    Physical Exam Vitals and nursing note reviewed.  Constitutional:      General: He is not in acute distress.    Appearance: He is well-developed. He is not diaphoretic.  Eyes:     General: No scleral icterus.    Conjunctiva/sclera: Conjunctivae normal.  Neck:     Thyroid: No thyromegaly.  Cardiovascular:     Rate and Rhythm: Normal rate and regular rhythm.     Heart sounds: Normal heart sounds. No murmur heard. Pulmonary:     Effort: Pulmonary effort is normal. No respiratory distress.     Breath sounds: Normal breath sounds. No wheezing.  Musculoskeletal:        General: No swelling. Normal range of motion.      Cervical back: Neck supple.  Lymphadenopathy:     Cervical: No cervical adenopathy.  Skin:    General: Skin is warm and dry.     Findings: No rash.  Neurological:     Mental Status: He is alert and oriented to person, place, and time.     Coordination: Coordination normal.  Psychiatric:        Behavior: Behavior normal.       Assessment & Plan:   Problem List Items Addressed This Visit       Cardiovascular and Mediastinum   Essential hypertension - Primary   Relevant Orders   CBC with Differential/Platelet   CMP14+EGFR   Lipid panel   SYSTOLIC HEART FAILURE, CHRONIC   CAD (coronary artery disease)     Respiratory   COPD (chronic obstructive pulmonary disease) (HCC)     Genitourinary   CKD (chronic kidney disease), stage III (HCC)   Relevant Orders   CBC with Differential/Platelet     Other   Hyperlipidemia LDL goal <100   Relevant Orders   Lipid panel    Seems to be doing well, blood pressure looks good, will have him come back in 6 months.  Will do blood work on the way out today. Follow up plan: Return in about 6 months (around 05/16/2023), or if symptoms worsen or fail to improve, for Hypertension and hyperlipidemia and COPD.  Counseling provided for all of the vaccine components Orders Placed This Encounter  Procedures   CBC with Differential/Platelet   CMP14+EGFR   Lipid panel    Arville Care, MD [facility] 11/13/2022, 2:25 PM

## 2022-11-14 LAB — CMP14+EGFR
ALT: 9 IU/L (ref 0–44)
AST: 14 IU/L (ref 0–40)
Albumin: 4.7 g/dL — ABNORMAL HIGH (ref 3.6–4.6)
Alkaline Phosphatase: 59 IU/L (ref 44–121)
BUN/Creatinine Ratio: 12 (ref 10–24)
BUN: 18 mg/dL (ref 10–36)
Bilirubin Total: 0.7 mg/dL (ref 0.0–1.2)
CO2: 24 mmol/L (ref 20–29)
Calcium: 10.3 mg/dL — ABNORMAL HIGH (ref 8.6–10.2)
Chloride: 98 mmol/L (ref 96–106)
Creatinine, Ser: 1.53 mg/dL — ABNORMAL HIGH (ref 0.76–1.27)
Globulin, Total: 2.7 g/dL (ref 1.5–4.5)
Glucose: 103 mg/dL — ABNORMAL HIGH (ref 70–99)
Potassium: 4.7 mmol/L (ref 3.5–5.2)
Sodium: 140 mmol/L (ref 134–144)
Total Protein: 7.4 g/dL (ref 6.0–8.5)
eGFR: 43 mL/min/{1.73_m2} — ABNORMAL LOW (ref >59)

## 2022-11-14 LAB — LIPID PANEL
Chol/HDL Ratio: 5 ratio (ref 0.0–5.0)
Cholesterol, Total: 308 mg/dL — ABNORMAL HIGH (ref 100–199)
HDL: 61 mg/dL (ref >39)
LDL Chol Calc (NIH): 221 mg/dL — ABNORMAL HIGH (ref 0–99)
Triglycerides: 140 mg/dL (ref 0–149)
VLDL Cholesterol Cal: 26 mg/dL (ref 5–40)

## 2022-11-14 LAB — CBC WITH DIFFERENTIAL/PLATELET
Basophils Absolute: 0.1 10*3/uL (ref 0.0–0.2)
Basos: 1 %
EOS (ABSOLUTE): 0.2 10*3/uL (ref 0.0–0.4)
Eos: 2 %
Hematocrit: 48.9 % (ref 37.5–51.0)
Hemoglobin: 16.2 g/dL (ref 13.0–17.7)
Immature Grans (Abs): 0 10*3/uL (ref 0.0–0.1)
Immature Granulocytes: 0 %
Lymphocytes Absolute: 3 10*3/uL (ref 0.7–3.1)
Lymphs: 30 %
MCH: 29.5 pg (ref 26.6–33.0)
MCHC: 33.1 g/dL (ref 31.5–35.7)
MCV: 89 fL (ref 79–97)
Monocytes Absolute: 0.7 10*3/uL (ref 0.1–0.9)
Monocytes: 7 %
Neutrophils Absolute: 5.9 10*3/uL (ref 1.4–7.0)
Neutrophils: 60 %
Platelets: 188 10*3/uL (ref 150–450)
RBC: 5.49 x10E6/uL (ref 4.14–5.80)
RDW: 12.5 % (ref 11.6–15.4)
WBC: 9.8 10*3/uL (ref 3.4–10.8)

## 2022-11-18 ENCOUNTER — Other Ambulatory Visit: Payer: Self-pay

## 2022-11-18 MED ORDER — PRAVASTATIN SODIUM 20 MG PO TABS: 20.0000 mg | ORAL_TABLET | Freq: Every day | ORAL | 3 refills | Status: DC

## 2023-01-07 ENCOUNTER — Other Ambulatory Visit: Payer: Self-pay | Admitting: Family Medicine

## 2023-01-07 DIAGNOSIS — I10 Essential (primary) hypertension: Secondary | ICD-10-CM

## 2023-05-19 ENCOUNTER — Ambulatory Visit (INDEPENDENT_AMBULATORY_CARE_PROVIDER_SITE_OTHER): Payer: Medicare Other | Admitting: Family Medicine

## 2023-05-19 ENCOUNTER — Encounter: Payer: Self-pay | Admitting: Family Medicine

## 2023-05-19 VITALS — BP 134/64 | HR 74 | Temp 97.0°F | Ht 67.0 in | Wt 124.0 lb

## 2023-05-19 DIAGNOSIS — I10 Essential (primary) hypertension: Secondary | ICD-10-CM | POA: Diagnosis not present

## 2023-05-19 DIAGNOSIS — J449 Chronic obstructive pulmonary disease, unspecified: Secondary | ICD-10-CM

## 2023-05-19 DIAGNOSIS — E785 Hyperlipidemia, unspecified: Secondary | ICD-10-CM | POA: Diagnosis not present

## 2023-05-19 DIAGNOSIS — N1831 Chronic kidney disease, stage 3a: Secondary | ICD-10-CM

## 2023-05-19 MED ORDER — SPIRONOLACTONE 25 MG PO TABS
12.5000 mg | ORAL_TABLET | Freq: Every day | ORAL | 1 refills | Status: DC
Start: 2023-05-19 — End: 2023-08-11

## 2023-05-19 MED ORDER — FUROSEMIDE 40 MG PO TABS
40.0000 mg | ORAL_TABLET | Freq: Every day | ORAL | 3 refills | Status: AC
Start: 2023-05-19 — End: ?

## 2023-05-19 NOTE — Progress Notes (Signed)
 BP 134/64   Pulse 74   Temp (!) 97 F (36.1 C)   Ht 5\' 7"  (1.702 m)   Wt 124 lb (56.2 kg)   SpO2 98%   BMI 19.42 kg/m    Subjective:   Patient ID: [name] [name], male    DOB: [birth year], 89 y.o.   MRN: [id number]  HPI: [name] [name] is a 89 y.o. male presenting on 05/19/2023 for Medical Management of Chronic Issues, Hyperlipidemia, and Hypertension   HPI Hypertension and CKD Patient is currently on spironolactone and Imdur and Lasix, and their blood pressure today is furosemide and Imdur and spironolactone. Patient denies any lightheadedness or dizziness. Patient denies headaches, blurred vision, chest pains, shortness of breath, or weakness. Denies any side effects from medication and is content with current medication.   Hyperlipidemia Patient is coming in for recheck of his hyperlipidemia. The patient is currently taking fish oils and pravastatin. They deny any issues with myalgias or history of liver damage from it. They deny any focal numbness or weakness or chest pain.   COPD Patient is coming in for COPD recheck today.  He is currently on no medicine currently.  He has a mild chronic cough but denies any major coughing spells or wheezing spells.  He has 0 nighttime symptoms per week and 0 daytime symptoms per week currently.   Patient has a skin lesion that is like a horn on his left ear and with scaly patch on his left upper chest.  Recommended dermatology  Relevant past medical, surgical, family and social history reviewed and updated as indicated. Interim medical history since our last visit reviewed. Allergies and medications reviewed and updated.  Review of Systems  Constitutional:  Negative for chills and fever.  Eyes:  Negative for visual disturbance.  Respiratory:  Negative for shortness of breath and wheezing.   Cardiovascular:  Negative for chest pain and leg swelling.  Musculoskeletal:  Negative for back pain and gait problem.  Skin:  Positive for color change  and rash.  Neurological:  Negative for dizziness and light-headedness.  All other systems reviewed and are negative.   Per HPI unless specifically indicated above   Allergies as of 05/19/2023       Reactions   Bee Pollen    Pollen Extract    Ramipril Hives, Swelling   Crestor [rosuvastatin] Anxiety        Medication List        Accurate as of May 19, 2023  4:32 PM. If you have any questions, ask your nurse or doctor.          aspirin 81 MG tablet Take 81 mg by mouth daily.   Fish Oil 1000 MG Cpdr Take 1 capsule by mouth daily.   furosemide 40 MG tablet Commonly known as: LASIX Take 1 tablet (40 mg total) by mouth daily.   Ginseng 250 MG Caps Take 1 capsule by mouth daily.   isosorbide mononitrate 30 MG 24 hr tablet Commonly known as: IMDUR TAKE ONE TABLET EVERY MORNING   multivitamin with minerals tablet Take 1 tablet by mouth daily.   nitroGLYCERIN 0.4 MG SL tablet Commonly known as: NITROSTAT Place 1 tablet under tongue every 5 minutes as needed for chest pain.   pravastatin 20 MG tablet Commonly known as: PRAVACHOL Take 1 tablet (20 mg total) by mouth daily.   spironolactone 25 MG tablet Commonly known as: ALDACTONE Take 0.5 tablets (12.5 mg total) by mouth daily.   zinc gluconate  50 MG tablet Take 50 mg by mouth daily.         Objective:   BP 134/64   Pulse 74   Temp (!) 97 F (36.1 C)   Ht 5\' 7"  (1.702 m)   Wt 124 lb (56.2 kg)   SpO2 98%   BMI 19.42 kg/m   Wt Readings from Last 3 Encounters:  05/19/23 124 lb (56.2 kg)  11/13/22 127 lb (57.6 kg)  10/03/22 129 lb 3.2 oz (58.6 kg)    Physical Exam Vitals and nursing note reviewed.  Constitutional:      General: He is not in acute distress.    Appearance: He is well-developed. He is not diaphoretic.  Eyes:     General: No scleral icterus.       Right eye: No discharge.     Conjunctiva/sclera: Conjunctivae normal.     Pupils: Pupils are equal, round, and reactive to  light.  Neck:     Thyroid: No thyromegaly.  Cardiovascular:     Rate and Rhythm: Normal rate and regular rhythm.     Heart sounds: Normal heart sounds. No murmur heard. Pulmonary:     Effort: Pulmonary effort is normal. No respiratory distress.     Breath sounds: Normal breath sounds. No wheezing.  Musculoskeletal:        General: Normal range of motion.     Cervical back: Neck supple.  Lymphadenopathy:     Cervical: No cervical adenopathy.  Skin:    General: Skin is warm and dry.     Findings: Rash (Scaly patch on left upper chest that has appearance of possible basal or squamous cell) present.     Comments: Cutaneous horn on left upper ear on the auricle.  Raised and black-colored in nature  Neurological:     Mental Status: He is alert and oriented to person, place, and time.     Coordination: Coordination normal.  Psychiatric:        Behavior: Behavior normal.       Assessment & Plan:   Problem List Items Addressed This Visit       Cardiovascular and Mediastinum   Essential hypertension   Relevant Medications   furosemide (LASIX) 40 MG tablet   spironolactone (ALDACTONE) 25 MG tablet   Other Relevant Orders   CBC with Differential/Platelet   CMP14+EGFR   Lipid panel     Respiratory   COPD (chronic obstructive pulmonary disease) (HCC)   Relevant Orders   CBC with Differential/Platelet   CMP14+EGFR   Lipid panel     Genitourinary   CKD (chronic kidney disease), stage III (HCC)   Relevant Orders   CBC with Differential/Platelet   CMP14+EGFR   Lipid panel     Other   Hyperlipidemia LDL goal <100 - Primary   Relevant Medications   furosemide (LASIX) 40 MG tablet   spironolactone (ALDACTONE) 25 MG tablet   Other Relevant Orders   CBC with Differential/Platelet   CMP14+EGFR   Lipid panel    Recommended see dermatology for skin lesions.  Continue current medicine, blood pressure and everything else looks good today, will check blood work on the way  out. Follow up plan: Return in about 6 months (around 11/16/2023), or if symptoms worsen or fail to improve, for Hypertension and hyperlipidemia.  Counseling provided for all of the vaccine components Orders Placed This Encounter  Procedures   CBC with Differential/Platelet   CMP14+EGFR   Lipid panel    Arville Care, MD [place]  [facility] 05/19/2023, 4:32 PM

## 2023-05-20 LAB — CMP14+EGFR
ALT: 9 [IU]/L (ref 0–44)
AST: 15 [IU]/L (ref 0–40)
Albumin: 4.5 g/dL (ref 3.6–4.6)
Alkaline Phosphatase: 55 [IU]/L (ref 44–121)
BUN/Creatinine Ratio: 11 (ref 10–24)
BUN: 17 mg/dL (ref 10–36)
Bilirubin Total: 0.7 mg/dL (ref 0.0–1.2)
CO2: 25 mmol/L (ref 20–29)
Calcium: 9.8 mg/dL (ref 8.6–10.2)
Chloride: 96 mmol/L (ref 96–106)
Creatinine, Ser: 1.54 mg/dL — ABNORMAL HIGH (ref 0.76–1.27)
Globulin, Total: 2.4 g/dL (ref 1.5–4.5)
Glucose: 101 mg/dL — ABNORMAL HIGH (ref 70–99)
Potassium: 4.6 mmol/L (ref 3.5–5.2)
Sodium: 140 mmol/L (ref 134–144)
Total Protein: 6.9 g/dL (ref 6.0–8.5)
eGFR: 42 mL/min/{1.73_m2} — ABNORMAL LOW (ref >59)

## 2023-05-20 LAB — CBC WITH DIFFERENTIAL/PLATELET
Basophils Absolute: 0.1 10*3/uL (ref 0.0–0.2)
Basos: 1 %
EOS (ABSOLUTE): 0.2 10*3/uL (ref 0.0–0.4)
Eos: 2 %
Hematocrit: 46.6 % (ref 37.5–51.0)
Hemoglobin: 15.6 g/dL (ref 13.0–17.7)
Immature Grans (Abs): 0 10*3/uL (ref 0.0–0.1)
Immature Granulocytes: 0 %
Lymphocytes Absolute: 3.2 10*3/uL — ABNORMAL HIGH (ref 0.7–3.1)
Lymphs: 34 %
MCH: 30.5 pg (ref 26.6–33.0)
MCHC: 33.5 g/dL (ref 31.5–35.7)
MCV: 91 fL (ref 79–97)
Monocytes Absolute: 0.6 10*3/uL (ref 0.1–0.9)
Monocytes: 7 %
Neutrophils Absolute: 5.4 10*3/uL (ref 1.4–7.0)
Neutrophils: 56 %
Platelets: 188 10*3/uL (ref 150–450)
RBC: 5.12 x10E6/uL (ref 4.14–5.80)
RDW: 12.6 % (ref 11.6–15.4)
WBC: 9.6 10*3/uL (ref 3.4–10.8)

## 2023-05-20 LAB — LIPID PANEL
Chol/HDL Ratio: 4.9 {ratio} (ref 0.0–5.0)
Cholesterol, Total: 262 mg/dL — ABNORMAL HIGH (ref 100–199)
HDL: 54 mg/dL (ref >39)
LDL Chol Calc (NIH): 192 mg/dL — ABNORMAL HIGH (ref 0–99)
Triglycerides: 94 mg/dL (ref 0–149)
VLDL Cholesterol Cal: 16 mg/dL (ref 5–40)

## 2023-05-26 ENCOUNTER — Other Ambulatory Visit: Payer: Self-pay

## 2023-05-26 MED ORDER — PRAVASTATIN SODIUM 40 MG PO TABS: 40.0000 mg | ORAL_TABLET | Freq: Every evening | ORAL | 1 refills | Status: AC

## 2023-08-11 ENCOUNTER — Other Ambulatory Visit: Payer: Self-pay | Admitting: Family Medicine

## 2023-08-11 DIAGNOSIS — I10 Essential (primary) hypertension: Secondary | ICD-10-CM

## 2023-08-11 NOTE — Telephone Encounter (Signed)
 TC from [name] pharmacy Pt states that he is to be on a whole tab of Spironolactone  instead of 1/2 tab Please advise

## 2023-08-11 NOTE — Telephone Encounter (Signed)
 Go ahead and change the prescription of spironolactone  for him to take 1 whole tablet daily, 90 with 1 refill.

## 2023-08-20 ENCOUNTER — Other Ambulatory Visit: Payer: Self-pay | Admitting: Internal Medicine

## 2023-08-26 ENCOUNTER — Telehealth: Payer: Self-pay | Admitting: Internal Medicine

## 2023-08-26 NOTE — Telephone Encounter (Signed)
 Spoke with Josh at [facility] and advised pt last seen 09/2022 and Spironolactone  was not increased by Dr Rodolfo Clan.  Pharmacist verbalizes understanding and states he will contact patient to discuss further.

## 2023-08-26 NOTE — Telephone Encounter (Signed)
 Pt c/o medication issue:  1. Name of Medication:   spironolactone  (ALDACTONE ) 25 MG tablet   2. How are you currently taking this medication (dosage and times per day)?   3. Are you having a reaction (difficulty breathing--STAT)?   4. What is your medication issue?   Caller Jerrilyn Moras) stated patient's medication was increased from half tablet to whole tablet.  Caller wants new prescription sent to [facility], [place] - [address]

## 2023-09-16 ENCOUNTER — Telehealth: Payer: Self-pay | Admitting: Internal Medicine

## 2023-09-16 MED ORDER — NITROGLYCERIN 0.4 MG SL SUBL: 0.4000 mg | SUBLINGUAL_TABLET | SUBLINGUAL | 0 refills | Status: AC | PRN

## 2023-09-16 NOTE — Telephone Encounter (Signed)
*  STAT* If patient is at the pharmacy, call can be transferred to refill team.   1. Which medications need to be refilled? (please list name of each medication and dose if known) nitroGLYCERIN  (NITROSTAT ) 0.4 MG SL tablet   2. Which pharmacy/location (including street and city if local pharmacy) is medication to be sent to?  [facility] [place], [place] - [address]    3. Do they need a 30 day or 90 day supply? 90

## 2023-09-16 NOTE — Telephone Encounter (Signed)
 Pt's medication was sent to pt's pharmacy as requested. Confirmation received.

## 2023-10-10 ENCOUNTER — Ambulatory Visit: Admitting: Internal Medicine

## 2023-10-23 DIAGNOSIS — B353 Tinea pedis: Secondary | ICD-10-CM | POA: Diagnosis not present

## 2023-10-23 DIAGNOSIS — B351 Tinea unguium: Secondary | ICD-10-CM | POA: Diagnosis not present

## 2023-10-23 DIAGNOSIS — M79675 Pain in left toe(s): Secondary | ICD-10-CM | POA: Diagnosis not present

## 2023-10-23 DIAGNOSIS — M79674 Pain in right toe(s): Secondary | ICD-10-CM | POA: Diagnosis not present

## 2023-10-30 NOTE — Progress Notes (Unsigned)
  Electrophysiology Office Note:   Date:  10/31/2023  ID:  [name] [name], DOB 10/23/1930, MRN [id number]  Primary Cardiologist: None Primary Heart Failure: None Electrophysiologist: Danelle Birmingham, MD > from Dr. Fernande     History of Present Illness:   [name] [name] is a 89 y.o. male with h/o HFrEF s/p CRT-D, AV node ablation, frequent PVC's, CAD s/p CABG seen today for routine electrophysiology followup.   Since last being seen in our clinic the patient reports he is doing very well. Accompanied by his wife and daughter who report dementia and repetition.     He denies chest pain, palpitations, dyspnea, PND, orthopnea, nausea, vomiting, dizziness, syncope, edema, weight gain, or early satiety.   Review of systems complete and found to be negative unless listed in HPI.   EP Information / Studies Reviewed:    EKG is ordered today. Personal review as below.      ICD Interrogation-  reviewed in detail today,  See PACEART report.  Device History: Medtronic PM implanted 11/15/99 post AV junction ablation, upgrade to BiV ICD 02/11/08 for HFrEF Generator Change > 12/13/13 Mixed Lead System History of appropriate therapy: No History of AAD therapy: Yes; previously tolerated amiodarone                  Physical Exam:   VS:  BP 128/78   Pulse 70   Ht 5' 7 (1.702 m)   Wt 121 lb 6.4 oz (55.1 kg)   SpO2 98%   BMI 19.01 kg/m    Wt Readings from Last 3 Encounters:  10/31/23 121 lb 6.4 oz (55.1 kg)  05/19/23 124 lb (56.2 kg)  11/13/22 127 lb (57.6 kg)     GEN: Well nourished, well developed in no acute distress NECK: No JVD; No carotid bruits CARDIAC: Regular rate and rhythm, no murmurs, rubs, gallops RESPIRATORY:  Clear to auscultation without rales, wheezing or rhonchi  ABDOMEN: Soft, non-tender, non-distended EXTREMITIES:  No edema; No deformity   ASSESSMENT AND PLAN:    Chronic Systolic Dysfunction due to ICM s/p Medtronic CRT-D  PVC's  AV Node Ablation  -euvolemic on exam     -79% % BiV pacing   -RV programmed to subthreshold RV pacing output   -pt previously elected not to proceed with generator replacement, elected to keep high voltage therapies on, EOS behavior will be loss of atrial capture > previously had an underlying SR based on prior checks despite AV node ablation   -Stable on an appropriate medical regimen -pt does not do remote monitoring  -Normal ICD function -See Pace Art report > limited check in clinic due to battery at EOS  -family elected to follow up PRN -No changes today  CAD s/p CABG  -no anginal symptoms   Dementia  -per primary   Disposition:   Follow up with EP APP PRN.  Family elects to follow up with PCP and with EP as needed   Pace Art routed to Dr. Birmingham as he has seen the patient before.     Signed, Daphne Barrack, NP-C, AGACNP-BC [facility] - Electrophysiology  10/31/2023, 3:17 PM

## 2023-10-31 ENCOUNTER — Encounter: Payer: Self-pay | Admitting: Pulmonary Disease

## 2023-10-31 ENCOUNTER — Ambulatory Visit: Attending: Pulmonary Disease | Admitting: Pulmonary Disease

## 2023-10-31 VITALS — BP 128/78 | HR 70 | Ht 67.0 in | Wt 121.4 lb

## 2023-10-31 DIAGNOSIS — Z95810 Presence of automatic (implantable) cardiac defibrillator: Secondary | ICD-10-CM | POA: Diagnosis not present

## 2023-10-31 DIAGNOSIS — I5022 Chronic systolic (congestive) heart failure: Secondary | ICD-10-CM

## 2023-10-31 DIAGNOSIS — I255 Ischemic cardiomyopathy: Secondary | ICD-10-CM

## 2023-10-31 LAB — CUP PACEART INCLINIC DEVICE CHECK
Date Time Interrogation Session: 20250808151006
Implantable Lead Connection Status: 753985
Implantable Lead Connection Status: 753985
Implantable Lead Connection Status: 753985
Implantable Lead Implant Date: 20010823
Implantable Lead Implant Date: 20010823
Implantable Lead Implant Date: 20091119
Implantable Lead Location: 753858
Implantable Lead Location: 753859
Implantable Lead Location: 753860
Implantable Lead Model: 148
Implantable Lead Model: 6940
Implantable Lead Serial Number: 110961
Implantable Pulse Generator Implant Date: 20150921

## 2023-10-31 NOTE — Patient Instructions (Signed)
 Medication Instructions:  Your physician recommends that you continue on your current medications as directed. Please refer to the Current Medication list given to you today.  *If you need a refill on your cardiac medications before your next appointment, please call your pharmacy*  Lab Work: None ordered If you have labs (blood work) drawn today and your tests are completely normal, you will receive your results only by: MyChart Message (if you have MyChart) OR A paper copy in the mail If you have any lab test that is abnormal or we need to change your treatment, we will call you to review the results.  Follow-Up: At [facility], you and your health needs are our priority.  As part of our continuing mission to provide you with exceptional heart care, our providers are all part of one team.  This team includes your primary Cardiologist (physician) and Advanced Practice Providers or APPs (Physician Assistants and Nurse Practitioners) who all work together to provide you with the care you need, when you need it.  Your next appointment:   As needed  Provider:   Daphne Barrack, NP

## 2023-11-17 ENCOUNTER — Encounter: Payer: Self-pay | Admitting: Family Medicine

## 2023-11-17 ENCOUNTER — Ambulatory Visit (INDEPENDENT_AMBULATORY_CARE_PROVIDER_SITE_OTHER): Payer: Medicare Other | Admitting: Family Medicine

## 2023-11-17 VITALS — BP 120/52 | HR 50 | Ht 67.0 in | Wt 121.0 lb

## 2023-11-17 DIAGNOSIS — I2510 Atherosclerotic heart disease of native coronary artery without angina pectoris: Secondary | ICD-10-CM

## 2023-11-17 DIAGNOSIS — J449 Chronic obstructive pulmonary disease, unspecified: Secondary | ICD-10-CM

## 2023-11-17 DIAGNOSIS — E785 Hyperlipidemia, unspecified: Secondary | ICD-10-CM | POA: Diagnosis not present

## 2023-11-17 DIAGNOSIS — I10 Essential (primary) hypertension: Secondary | ICD-10-CM

## 2023-11-17 NOTE — Progress Notes (Signed)
 BP (!) 120/52   Pulse (!) 50   Ht 5' 7 (1.702 m)   Wt 121 lb (54.9 kg)   SpO2 92%   BMI 18.95 kg/m    Subjective:   Patient ID: [name] [name], male    DOB: 01/16/1931, 89 y.o.   MRN: [id number]  HPI: [name] [name] is a 89 y.o. male presenting on 11/17/2023 for Medical Management of Chronic Issues, Hyperlipidemia, Hypertension, and COPD   Discussed the use of AI scribe software for clinical note transcription with the patient, who gave verbal consent to proceed.  History of Present Illness   [name] [name] is a 89 year old male with hypertension who presents for routine follow-up. He is accompanied by his stepdaughter, who assists with his care.  He has no issues with his current medication regimen, which includes isosorbide , spironolactone , and furosemide  as needed. He continues to take pravastatin  and fish oils for cholesterol management without problems. He keeps nitroglycerin  on hand and takes a baby aspirin daily but has not needed to use the nitroglycerin  recently.  There is a discussion about his ability to sign legal documents, specifically regarding the transfer of a trailer to his daughter, [name] [name]. His stepdaughter mentions that he moved in with his wife and has a trailer that his daughter wants to purchase.      Relevant past medical, surgical, family and social history reviewed and updated as indicated. Interim medical history since our last visit reviewed. Allergies and medications reviewed and updated.  Review of Systems  Constitutional:  Negative for chills and fever.  Eyes:  Negative for visual disturbance.  Respiratory:  Negative for shortness of breath and wheezing.   Cardiovascular:  Negative for chest pain and leg swelling.  Musculoskeletal:  Negative for back pain and gait problem.  Skin:  Negative for rash.  Neurological:  Negative for dizziness and light-headedness.  All other systems reviewed and are negative.   Per HPI unless specifically indicated  above   Allergies as of 11/17/2023       Reactions   Bee Pollen    Pollen Extract    Ramipril  Hives, Swelling   Crestor [rosuvastatin] Anxiety        Medication List        Accurate as of November 17, 2023  4:15 PM. If you have any questions, ask your nurse or doctor.          aspirin 81 MG tablet Take 81 mg by mouth daily.   Fish Oil 1000 MG Cpdr Take 1 capsule by mouth daily.   furosemide  40 MG tablet Commonly known as: LASIX  Take 1 tablet (40 mg total) by mouth daily.   Ginseng 250 MG Caps Take 1 capsule by mouth daily.   isosorbide  mononitrate 30 MG 24 hr tablet Commonly known as: IMDUR  TAKE ONE TABLET EVERY MORNING   multivitamin with minerals tablet Take 1 tablet by mouth daily.   nitroGLYCERIN  0.4 MG SL tablet Commonly known as: NITROSTAT  Place 1 tablet (0.4 mg total) under the tongue every 5 (five) minutes as needed for chest pain.   pravastatin  40 MG tablet Commonly known as: PRAVACHOL  Take 1 tablet (40 mg total) by mouth at bedtime.   spironolactone  25 MG tablet Commonly known as: ALDACTONE  TAKE 1/2 TABLET DAILY   zinc gluconate 50 MG tablet Take 50 mg by mouth daily.         Objective:   BP (!) 120/52   Pulse (!) 50   Ht  5' 7 (1.702 m)   Wt 121 lb (54.9 kg)   SpO2 92%   BMI 18.95 kg/m   Wt Readings from Last 3 Encounters:  11/17/23 121 lb (54.9 kg)  10/31/23 121 lb 6.4 oz (55.1 kg)  05/19/23 124 lb (56.2 kg)    Physical Exam Physical Exam   VITALS: BP- 120/52 CHEST: Lungs clear to auscultation bilaterally. CARDIOVASCULAR: Heart sounds normal.         Assessment & Plan:   Problem List Items Addressed This Visit       Cardiovascular and Mediastinum   Essential hypertension   Relevant Orders   CBC with Differential/Platelet   CMP14+EGFR   Lipid panel   CAD (coronary artery disease)   Relevant Orders   CBC with Differential/Platelet   CMP14+EGFR   Lipid panel     Respiratory   COPD (chronic obstructive  pulmonary disease) (HCC)     Other   Hyperlipidemia LDL goal <100 - Primary   Relevant Orders   CBC with Differential/Platelet   CMP14+EGFR   Lipid panel       Hypertension Blood pressure controlled at 120/52 mmHg. Stable condition without recent nitroglycerin  use. - Continue isosorbide , spironolactone , and furosemide  as needed. - Maintain nitroglycerin  on hand. - Continue baby aspirin. - Order blood work.  Hyperlipidemia Condition managed with pravastatin  and fish oils without issues. - Continue pravastatin  and fish oils.  Cognitive Capacity for Legal Purposes - Provide a letter confirming cognitive capacity for legal purposes.          Follow up plan: Return in about 6 months (around 05/19/2024), or if symptoms worsen or fail to improve, for htn and hld.  Counseling provided for all of the vaccine components Orders Placed This Encounter  Procedures   CBC with Differential/Platelet   CMP14+EGFR   Lipid panel    Fonda Levins, MD [facility] 11/17/2023, 4:15 PM

## 2023-11-18 LAB — CMP14+EGFR
ALT: 8 IU/L (ref 0–44)
AST: 14 IU/L (ref 0–40)
Albumin: 4.6 g/dL (ref 3.6–4.6)
Alkaline Phosphatase: 62 IU/L (ref 44–121)
BUN/Creatinine Ratio: 10 (ref 10–24)
BUN: 16 mg/dL (ref 10–36)
Bilirubin Total: 0.6 mg/dL (ref 0.0–1.2)
CO2: 25 mmol/L (ref 20–29)
Calcium: 10.3 mg/dL — AB (ref 8.6–10.2)
Chloride: 96 mmol/L (ref 96–106)
Creatinine, Ser: 1.54 mg/dL — AB (ref 0.76–1.27)
Globulin, Total: 2.8 g/dL (ref 1.5–4.5)
Glucose: 99 mg/dL (ref 70–99)
Potassium: 4.5 mmol/L (ref 3.5–5.2)
Sodium: 137 mmol/L (ref 134–144)
Total Protein: 7.4 g/dL (ref 6.0–8.5)
eGFR: 42 mL/min/1.73 — AB (ref >59)

## 2023-11-18 LAB — LIPID PANEL
Chol/HDL Ratio: 5.2 ratio — AB (ref 0.0–5.0)
Cholesterol, Total: 288 mg/dL — AB (ref 100–199)
HDL: 55 mg/dL (ref >39)
LDL Chol Calc (NIH): 208 mg/dL — AB (ref 0–99)
Triglycerides: 135 mg/dL (ref 0–149)
VLDL Cholesterol Cal: 25 mg/dL (ref 5–40)

## 2023-11-18 LAB — CBC WITH DIFFERENTIAL/PLATELET
Basophils Absolute: 0.1 x10E3/uL (ref 0.0–0.2)
Basos: 1 %
EOS (ABSOLUTE): 0.3 x10E3/uL (ref 0.0–0.4)
Eos: 2 %
Hematocrit: 47.6 % (ref 37.5–51.0)
Hemoglobin: 15.6 g/dL (ref 13.0–17.7)
Immature Grans (Abs): 0 x10E3/uL (ref 0.0–0.1)
Immature Granulocytes: 0 %
Lymphocytes Absolute: 3.2 x10E3/uL — ABNORMAL HIGH (ref 0.7–3.1)
Lymphs: 30 %
MCH: 29.9 pg (ref 26.6–33.0)
MCHC: 32.8 g/dL (ref 31.5–35.7)
MCV: 91 fL (ref 79–97)
Monocytes Absolute: 0.8 x10E3/uL (ref 0.1–0.9)
Monocytes: 8 %
Neutrophils Absolute: 6.3 x10E3/uL (ref 1.4–7.0)
Neutrophils: 59 %
Platelets: 201 x10E3/uL (ref 150–450)
RBC: 5.22 x10E6/uL (ref 4.14–5.80)
RDW: 12.3 % (ref 11.6–15.4)
WBC: 10.7 x10E3/uL (ref 3.4–10.8)

## 2023-11-21 ENCOUNTER — Ambulatory Visit: Payer: Self-pay | Admitting: Family Medicine

## 2023-11-21 MED ORDER — EZETIMIBE 10 MG PO TABS: 10.0000 mg | ORAL_TABLET | Freq: Every day | ORAL | 3 refills | Status: AC

## 2023-12-29 ENCOUNTER — Ambulatory Visit (INDEPENDENT_AMBULATORY_CARE_PROVIDER_SITE_OTHER)

## 2023-12-29 DIAGNOSIS — Z23 Encounter for immunization: Secondary | ICD-10-CM

## 2023-12-31 ENCOUNTER — Other Ambulatory Visit: Payer: Self-pay | Admitting: Internal Medicine
